# Patient Record
Sex: Male | Born: 1970
Health system: Southern US, Community
[De-identification: ages and names within clinical notes are randomized; demographics above are authoritative.]

## PROBLEM LIST (undated history)

## (undated) DIAGNOSIS — M109 Gout, unspecified: Secondary | ICD-10-CM

## (undated) DIAGNOSIS — K219 Gastro-esophageal reflux disease without esophagitis: Secondary | ICD-10-CM

## (undated) DIAGNOSIS — I319 Disease of pericardium, unspecified: Secondary | ICD-10-CM

## (undated) DIAGNOSIS — R51 Headache: Secondary | ICD-10-CM

## (undated) DIAGNOSIS — R109 Unspecified abdominal pain: Secondary | ICD-10-CM

## (undated) DIAGNOSIS — R0789 Other chest pain: Secondary | ICD-10-CM

## (undated) DIAGNOSIS — R9431 Abnormal electrocardiogram [ECG] [EKG]: Secondary | ICD-10-CM

## (undated) HISTORY — DX: Other chest pain: R07.89

## (undated) HISTORY — DX: Unspecified abdominal pain: R10.9

## (undated) HISTORY — DX: Disease of pericardium, unspecified: I31.9

---

## 2010-03-30 ENCOUNTER — Emergency Department (HOSPITAL_COMMUNITY): Admission: EM | Admit: 2010-03-30 | Discharge: 2010-03-30 | Payer: Self-pay | Admitting: Emergency Medicine

## 2010-04-06 ENCOUNTER — Ambulatory Visit: Payer: Self-pay | Admitting: Diagnostic Radiology

## 2010-04-06 ENCOUNTER — Emergency Department (HOSPITAL_BASED_OUTPATIENT_CLINIC_OR_DEPARTMENT_OTHER): Admission: EM | Admit: 2010-04-06 | Discharge: 2010-04-06 | Payer: Self-pay | Admitting: Emergency Medicine

## 2011-11-25 ENCOUNTER — Emergency Department (INDEPENDENT_AMBULATORY_CARE_PROVIDER_SITE_OTHER): Payer: BC Managed Care – PPO

## 2011-11-25 ENCOUNTER — Emergency Department (HOSPITAL_BASED_OUTPATIENT_CLINIC_OR_DEPARTMENT_OTHER)
Admission: EM | Admit: 2011-11-25 | Discharge: 2011-11-25 | Disposition: A | Discharge status: Home / Self Care | Payer: BC Managed Care – PPO | Attending: Emergency Medicine | Admitting: Emergency Medicine

## 2011-11-25 ENCOUNTER — Encounter (HOSPITAL_BASED_OUTPATIENT_CLINIC_OR_DEPARTMENT_OTHER): Payer: Self-pay | Admitting: *Deleted

## 2011-11-25 DIAGNOSIS — R209 Unspecified disturbances of skin sensation: Secondary | ICD-10-CM | POA: Insufficient documentation

## 2011-11-25 DIAGNOSIS — R55 Syncope and collapse: Secondary | ICD-10-CM | POA: Insufficient documentation

## 2011-11-25 DIAGNOSIS — R42 Dizziness and giddiness: Secondary | ICD-10-CM | POA: Insufficient documentation

## 2011-11-25 DIAGNOSIS — R079 Chest pain, unspecified: Secondary | ICD-10-CM

## 2011-11-25 DIAGNOSIS — M542 Cervicalgia: Secondary | ICD-10-CM | POA: Insufficient documentation

## 2011-11-25 DIAGNOSIS — R51 Headache: Secondary | ICD-10-CM | POA: Insufficient documentation

## 2011-11-25 DIAGNOSIS — R0789 Other chest pain: Secondary | ICD-10-CM | POA: Insufficient documentation

## 2011-11-25 LAB — CBC
Hemoglobin: 12.9 g/dL — ABNORMAL LOW (ref 13.0–17.0)
MCV: 89.4 fL (ref 78.0–100.0)
Platelets: 172 10*3/uL (ref 150–400)
RBC: 4.25 MIL/uL (ref 4.22–5.81)
WBC: 4.5 10*3/uL (ref 4.0–10.5)

## 2011-11-25 LAB — BASIC METABOLIC PANEL
CO2: 29 mEq/L (ref 19–32)
Glucose, Bld: 77 mg/dL (ref 70–99)
Potassium: 3.3 mEq/L — ABNORMAL LOW (ref 3.5–5.1)
Sodium: 140 mEq/L (ref 135–145)

## 2011-11-25 LAB — CARDIAC PANEL(CRET KIN+CKTOT+MB+TROPI)
CK, MB: 3.8 ng/mL (ref 0.3–4.0)
Relative Index: 1.4 (ref 0.0–2.5)
Troponin I: <0.3 ng/mL (ref <0.30)

## 2011-11-25 LAB — D-DIMER, QUANTITATIVE: D-Dimer, Quant: <0.22 ug/mL-FEU (ref 0.00–0.48)

## 2011-11-25 LAB — DIFFERENTIAL
Lymphocytes Relative: 40 % (ref 12–46)
Lymphs Abs: 1.8 10*3/uL (ref 0.7–4.0)
Monocytes Relative: 9 % (ref 3–12)
Neutrophils Relative %: 48 % (ref 43–77)

## 2011-11-25 MED ORDER — IBUPROFEN 800 MG PO TABS
800.0000 mg | ORAL_TABLET | Freq: Once | ORAL | Status: AC
  Administered 2011-11-25: 800 mg via ORAL
  Filled 2011-11-25: qty 1

## 2011-11-25 MED ORDER — HYDROCODONE-ACETAMINOPHEN 5-500 MG PO TABS: 1.0000 | ORAL_TABLET | Freq: Four times a day (QID) | ORAL | Status: AC | PRN

## 2011-11-25 MED ORDER — ASPIRIN 81 MG PO CHEW
324.0000 mg | CHEWABLE_TABLET | Freq: Once | ORAL | Status: AC
  Administered 2011-11-25: 324 mg via ORAL
  Filled 2011-11-25: qty 4

## 2011-11-25 MED ORDER — POTASSIUM CHLORIDE 20 MEQ/15ML (10%) PO LIQD
20.0000 meq | Freq: Once | ORAL | Status: AC
  Administered 2011-11-25: 20 meq via ORAL
  Filled 2011-11-25: qty 15

## 2011-11-25 MED ORDER — HYDROMORPHONE HCL PF 1 MG/ML IJ SOLN
1.0000 mg | Freq: Once | INTRAMUSCULAR | Status: AC
  Administered 2011-11-25: 1 mg via INTRAVENOUS
  Filled 2011-11-25: qty 1

## 2011-11-25 MED ORDER — MORPHINE SULFATE 4 MG/ML IJ SOLN
4.0000 mg | Freq: Once | INTRAMUSCULAR | Status: AC
  Administered 2011-11-25: 4 mg via INTRAVENOUS
  Filled 2011-11-25: qty 1

## 2011-11-25 NOTE — ED Provider Notes (Signed)
Medical screening examination/treatment/procedure(s) were conducted as a shared visit with non-physician practitioner(s) and myself.  I personally evaluated the patient during the encounter  Atypical CP-- Rt sided, reproducible, worse with movment. +Pleuritic. Dimer negative. Pain constant x 8 hours, trop negative. No RF for CAD-- denies smoking, HTN, HLD, DM, fmhx. No RF for VTE. EKG with STE V2/3 slightly in V4 appears to be 2/2 LVH. Pain controlled in ED. Declining second set of CE. Needs PMD f/u. Precautions for return.  Forbes Cellar, MD 11/25/11 [phone]

## 2011-11-25 NOTE — ED Notes (Signed)
Sharp pain in his right chest. Pain comes and goes. It started 2 weeks ago. Pain makes his right arm numb.

## 2011-11-25 NOTE — Discharge Instructions (Signed)
Mr  Chris Fox get a pcp from the list below to follow up with this week.  Your EKG showed cardiac enlargement.  and labs were normal today.  Keep using the ibuprofen every 6 hours with food x 24 hours and the ice.  Use the vicodin as needed for severe pain but do not drive with this medication.  You may go to work tomorrow if you feel better.  Return to the ER for severe pain or SOB. RESOURCE GUIDE  Dental Problems  Patients with Medicaid: [facility] [phone]                                           [phone] [address] [employer] [address] Phone:  [phone]                                                  Phone:  [phone]  If unable to pay or uninsured, contact:  [facility] or [facility]. to become qualified for the adult dental clinic.  Chronic Pain Problems Contact [facility]  [phone] Patients need to be referred by their primary care doctor.  Insufficient Money for Medicine Contact [employer]:  call "211" or [facility] [phone].  No Primary Care Doctor Call [facility]  [phone] Other agencies that provide inexpensive medical care    [facility]  [phone]    [facility]  [phone]    [facility]  [phone]    [facility]  [phone]    [facility]  [phone]    [facility]  [phone]  Psychological Services [facility]  [phone] [facility] Services  [phone] [place] [facility] Mental [facility]   [phone] (emergency services [phone])  Substance Abuse Resources [facility]  [phone] [facility] [phone] [facility] [place] [phone] Floydene Flock [phone] Residential & Outpatient Substance Abuse Program  ([phone]  Abuse/Neglect [facility] Child Abuse Hotline (313)263-0836 [facility] Child Abuse Hotline (709) [phone] (After Hours)  Emergency  Shelter [facility] [phone]  Maternity Homes [facility] at the [name] [facility] [phone] [facility] [phone]  MRSA Hotline #:   310-289-5991    [facility] Resources  [facility]     [employer]                          [facility] Dept. [address] [place]                       [address]      [address]  Patrecia Pace  [facility] Phone:  [phone]                                   Phone:  [phone]                 Phone:  [phone]  [facility] Mental Health Phone:  [phone]  [facility] Child Abuse Hotline ([phone] [phone] (After Hours)

## 2011-11-25 NOTE — ED Provider Notes (Signed)
History     CSN: [id number]  Arrival date & time 11/25/11  1531   First MD Initiated Contact with Patient 11/25/11 1604      No chief complaint on file.   (Consider location/radiation/quality/duration/timing/severity/associated sxs/prior treatment) Patient is a 41 y.o. Fox presenting with chest pain. The history is provided by the patient. No language interpreter was used.  Chest Pain Chest pain occurs constantly. The chest pain is unchanged. The pain is associated with breathing. At its most intense, the pain is at 10/10. The pain is currently at 7/10. The severity of the pain is moderate. The quality of the pain is described as sharp and aching. The pain radiates to the right shoulder and right arm. Chest pain is worsened by certain positions and deep breathing. Primary symptoms include shortness of breath and dizziness. Pertinent negatives for primary symptoms include no fever, no fatigue, no syncope, no cough, no wheezing, no palpitations, no abdominal pain, no nausea and no vomiting.  Dizziness also occurs with diaphoresis. Dizziness does not occur with nausea, vomiting or weakness.   Associated symptoms include diaphoresis, near-syncope and numbness.  Pertinent negatives for associated symptoms include no claudication, no lower extremity edema, no orthopnea, no paroxysmal nocturnal dyspnea and no weakness. He tried aspirin (not sure how much (10am)) for the symptoms. Risk factors include Fox gender and substance abuse.  Pertinent negatives for past medical history include no aneurysm, no anxiety/panic attacks, no aortic aneurysm, no aortic dissection, no CAD and no cancer.  His family medical history is significant for diabetes in family, heart disease in family and hypertension in family.  Pertinent negatives for family medical history include: no early MI in family, no PE in family, no stroke in family, no sudden death in family and no TIA in family.  Procedure history is negative  for cardiac catheterization, echocardiogram, persantine thallium, stress echo, stress thallium and exercise treadmill test.   41 year old Fox coming in with right chest pain that is constant since 9:30 this morning. States that the pain is a sharp pain that is 9/10 at its worst and 5 at 10 otherwise. States that the pain is worse raising the right arm and palpitation to the right chest, and taking a deep breath. Patient is taking 2 aspirin for pain unclear what milligram at work. Also states that the pain radiates to his neck when he raises his right arm.. states he did have some right upper extremity numbness that lasted for seconds. He also thought that he felt dizzy earlier that also lasted for seconds. Does not smoke drink or do drugs. States that 2 weeks ago same pain lasted less than 5 minutes with no shortness of breath.  History reviewed. No pertinent past medical history.  History reviewed. No pertinent past surgical history.  No family history on file.  History  Substance Use Topics  . Smoking status: Never Smoker   . Smokeless tobacco: Not on file  . Alcohol Use: No      Review of Systems  Constitutional: Positive for diaphoresis. Negative for fever, chills and fatigue.  HENT: Positive for neck pain. Negative for ear pain, sore throat, trouble swallowing and neck stiffness.   Respiratory: Positive for shortness of breath. Negative for cough and wheezing.   Cardiovascular: Positive for chest pain and near-syncope. Negative for palpitations, orthopnea, claudication and syncope.  Gastrointestinal: Negative for nausea, vomiting and abdominal pain.  Genitourinary: Negative.   Musculoskeletal: Negative for back pain.  Skin: Negative.   Neurological: Positive for  dizziness, light-headedness, numbness and headaches. Negative for syncope, speech difficulty and weakness.  Psychiatric/Behavioral: Negative.   All other systems reviewed and are negative.    Allergies  Review of  patient's allergies indicates no known allergies.  Home Medications   Current Outpatient Rx  Name Route Sig Dispense Refill  . IBUPROFEN 200 MG PO TABS Oral Take 200 mg by mouth every 6 (six) hours as needed. Patient used this medication for his tooth pain.      BP 131/73  Pulse 61  Temp(Src) 99.8 F (37.7 C) (Oral)  Resp 20  SpO2 99%  Physical Exam  Nursing note and vitals reviewed. Constitutional: He is oriented to person, place, and time. He appears well-developed and well-nourished. No distress.  HENT:  Head: Normocephalic.  Eyes: Conjunctivae and EOM are normal. Pupils are equal, round, and reactive to light.  Neck: Normal range of motion. Neck supple.  Cardiovascular: Normal rate, regular rhythm, normal heart sounds and intact distal pulses.  Exam reveals no gallop and no friction rub.   No murmur heard.      Reproducible pain to R chest Worse with raising the RUE and leaning forward.    Pulmonary/Chest: Effort normal and breath sounds normal. No respiratory distress. He has no wheezes.  Abdominal: Soft. Bowel sounds are normal.  Musculoskeletal: Normal range of motion. He exhibits tenderness. He exhibits no edema.       R chest  Neurological: He is alert and oriented to person, place, and time. No cranial nerve deficit.  Skin: Skin is warm and dry.  Psychiatric: He has a normal mood and affect.    ED Course  Procedures (including critical care time)   Labs Reviewed  CBC  DIFFERENTIAL  BASIC METABOLIC PANEL  CARDIAC PANEL(CRET KIN+CKTOT+MB+TROPI)  D-DIMER, QUANTITATIVE   Dg Chest Port 1 View  11/25/2011  *RADIOLOGY REPORT*  Clinical Data: Right-sided chest pain and arm numbness.  PORTABLE CHEST - 1 VIEW  Comparison: No priors.  Findings: Lung volumes are normal.  No consolidative airspace disease.  No pleural effusions.  No pneumothorax.  No pulmonary nodule or mass noted.  Pulmonary vasculature and the cardiomediastinal silhouette are within normal limits.   IMPRESSION: 1. No radiographic evidence of acute cardiopulmonary disease.  Original Report Authenticated By: Florencia Reasons, M.D.     No diagnosis found.    MDM  C/o R chest pain x 8 hours.  Worse with palpatation and leaning forward.  -trop.  EKG show cardiomegaly.  Better after morphine for pain 5/10  D-dimer negative.  CK elevated,  CKMB normal.  Will repeat pain meds: dilaudid 1mg  IV, and ibuprofen 800mg  and ice.    Labs Reviewed  CBC - Abnormal; Notable for the following:    Hemoglobin 12.9 (*)    HCT 38.0 (*)    RDW 11.1 (*)    All other components within normal limits  BASIC METABOLIC PANEL - Abnormal; Notable for the following:    Potassium 3.3 (*)    All other components within normal limits  CARDIAC PANEL(CRET KIN+CKTOT+MB+TROPI) - Abnormal; Notable for the following:    Total CK 277 (*)    All other components within normal limits  DIFFERENTIAL  D-DIMER, QUANTITATIVE    Date: 11/25/2011  Rate: 58  Rhythm: normal sinus rhythm  QRS Axis: normal  Intervals: normal  ST/T Wave abnormalities: normal  Conduction Disutrbances:none  Narrative Interpretation: L atrial enlargement  Old EKG Reviewed: none available  1800  Report given to Dr. Hyman Hopes.  Will recheck pain level then probably dc home.           Remi Haggard, NP 11/25/11 1805

## 2011-12-10 ENCOUNTER — Encounter (HOSPITAL_COMMUNITY): Payer: Self-pay | Admitting: Emergency Medicine

## 2011-12-10 ENCOUNTER — Encounter (HOSPITAL_COMMUNITY)
Admission: EM | Disposition: A | Discharge status: Home / Self Care | Payer: Self-pay | Source: Ambulatory Visit | Attending: Emergency Medicine

## 2011-12-10 ENCOUNTER — Encounter (HOSPITAL_COMMUNITY): Payer: Self-pay | Admitting: Cardiology

## 2011-12-10 ENCOUNTER — Emergency Department (HOSPITAL_COMMUNITY)
Admission: EM | Admit: 2011-12-10 | Discharge: 2011-12-10 | Disposition: A | Discharge status: Nursing Home (Includes ICF) | Payer: BC Managed Care – PPO | Source: Home / Self Care | Attending: Emergency Medicine | Admitting: Emergency Medicine

## 2011-12-10 ENCOUNTER — Emergency Department (HOSPITAL_COMMUNITY): Payer: BC Managed Care – PPO

## 2011-12-10 ENCOUNTER — Ambulatory Visit (HOSPITAL_COMMUNITY)
Admission: EM | Admit: 2011-12-10 | Discharge: 2011-12-11 | Disposition: A | Discharge status: Home / Self Care | Payer: BC Managed Care – PPO | Source: Ambulatory Visit | Attending: Cardiology | Admitting: Cardiology

## 2011-12-10 ENCOUNTER — Inpatient Hospital Stay (HOSPITAL_COMMUNITY): Payer: BC Managed Care – PPO

## 2011-12-10 DIAGNOSIS — I219 Acute myocardial infarction, unspecified: Secondary | ICD-10-CM

## 2011-12-10 DIAGNOSIS — R079 Chest pain, unspecified: Secondary | ICD-10-CM

## 2011-12-10 DIAGNOSIS — R071 Chest pain on breathing: Secondary | ICD-10-CM | POA: Insufficient documentation

## 2011-12-10 DIAGNOSIS — R072 Precordial pain: Secondary | ICD-10-CM

## 2011-12-10 DIAGNOSIS — R001 Bradycardia, unspecified: Secondary | ICD-10-CM | POA: Diagnosis present

## 2011-12-10 HISTORY — DX: Gastro-esophageal reflux disease without esophagitis: K21.9

## 2011-12-10 HISTORY — DX: Headache: R51

## 2011-12-10 HISTORY — PX: LEFT HEART CATHETERIZATION WITH CORONARY ANGIOGRAM: SHX5451

## 2011-12-10 HISTORY — DX: Abnormal electrocardiogram (ECG) (EKG): R94.31

## 2011-12-10 HISTORY — PX: CARDIAC CATHETERIZATION: SHX172

## 2011-12-10 LAB — CARDIAC PANEL(CRET KIN+CKTOT+MB+TROPI)
CK, MB: 4 ng/mL (ref 0.3–4.0)
Relative Index: 1.2 (ref 0.0–2.5)
Relative Index: 1.1 (ref 0.0–2.5)
Total CK: 409 U/L — ABNORMAL HIGH (ref 7–232)
Total CK: 359 U/L — ABNORMAL HIGH (ref 7–232)
Troponin I: <0.3 ng/mL (ref <0.30)

## 2011-12-10 LAB — BASIC METABOLIC PANEL
CO2: 24 mEq/L (ref 19–32)
Calcium: 9.2 mg/dL (ref 8.4–10.5)
Chloride: 105 mEq/L (ref 96–112)
GFR calc Af Amer: >90 mL/min (ref >90)
Sodium: 139 mEq/L (ref 135–145)

## 2011-12-10 LAB — HEPATIC FUNCTION PANEL
ALT: 11 U/L (ref 0–53)
AST: 22 U/L (ref 0–37)
Albumin: 3.7 g/dL (ref 3.5–5.2)
Alkaline Phosphatase: 80 U/L (ref 39–117)
Total Bilirubin: 0.4 mg/dL (ref 0.3–1.2)
Total Protein: 6.6 g/dL (ref 6.0–8.3)

## 2011-12-10 LAB — DIFFERENTIAL
Basophils Absolute: 0 10*3/uL (ref 0.0–0.1)
Basophils Relative: 0 % (ref 0–1)
Lymphocytes Relative: 52 % — ABNORMAL HIGH (ref 12–46)
Neutro Abs: 2 10*3/uL (ref 1.7–7.7)

## 2011-12-10 LAB — CBC
HCT: 35.3 % — ABNORMAL LOW (ref 39.0–52.0)
Hemoglobin: 11.8 g/dL — ABNORMAL LOW (ref 13.0–17.0)
MCH: 30 pg (ref 26.0–34.0)
MCHC: 33.4 g/dL (ref 30.0–36.0)
MCHC: 33.4 g/dL (ref 30.0–36.0)
MCV: 89.8 fL (ref 78.0–100.0)
Platelets: 182 10*3/uL (ref 150–400)
RBC: 3.93 MIL/uL — ABNORMAL LOW (ref 4.22–5.81)
RDW: 11.6 % (ref 11.5–15.5)
WBC: 5.3 10*3/uL (ref 4.0–10.5)

## 2011-12-10 LAB — HEMOGLOBIN A1C
Hgb A1c MFr Bld: 5.5 % (ref <5.7)
Mean Plasma Glucose: 111 mg/dL (ref <117)

## 2011-12-10 LAB — POCT I-STAT TROPONIN I: Troponin i, poc: 0 ng/mL (ref 0.00–0.08)

## 2011-12-10 LAB — LIPID PANEL
HDL: 35 mg/dL — ABNORMAL LOW (ref >39)
LDL Cholesterol: 95 mg/dL (ref 0–99)

## 2011-12-10 LAB — APTT: aPTT: 33 seconds (ref 24–37)

## 2011-12-10 SURGERY — LEFT HEART CATHETERIZATION WITH CORONARY ANGIOGRAM
Anesthesia: LOCAL

## 2011-12-10 MED ORDER — FENTANYL CITRATE 0.05 MG/ML IJ SOLN
INTRAMUSCULAR | Status: AC
  Filled 2011-12-10: qty 2

## 2011-12-10 MED ORDER — SODIUM CHLORIDE 0.9 % IV SOLN: INTRAVENOUS | Status: AC

## 2011-12-10 MED ORDER — ONDANSETRON HCL 4 MG PO TABS: 4.0000 mg | ORAL_TABLET | Freq: Four times a day (QID) | ORAL | Status: DC | PRN

## 2011-12-10 MED ORDER — SODIUM CHLORIDE 0.9 % IJ SOLN
3.0000 mL | Freq: Two times a day (BID) | INTRAMUSCULAR | Status: DC
  Administered 2011-12-10 – 2011-12-11 (×2): 3 mL via INTRAVENOUS

## 2011-12-10 MED ORDER — NITROGLYCERIN 0.2 MG/ML ON CALL CATH LAB
INTRAVENOUS | Status: AC
  Filled 2011-12-10: qty 1

## 2011-12-10 MED ORDER — ALUM & MAG HYDROXIDE-SIMETH 200-200-20 MG/5ML PO SUSP: 30.0000 mL | Freq: Four times a day (QID) | ORAL | Status: DC | PRN

## 2011-12-10 MED ORDER — ONDANSETRON HCL 4 MG/2ML IJ SOLN
4.0000 mg | Freq: Once | INTRAMUSCULAR | Status: AC
  Administered 2011-12-10: 12:00:00 via INTRAVENOUS

## 2011-12-10 MED ORDER — HEPARIN (PORCINE) IN NACL 2-0.9 UNIT/ML-% IJ SOLN
INTRAMUSCULAR | Status: AC
  Filled 2011-12-10: qty 2000

## 2011-12-10 MED ORDER — ACETAMINOPHEN 325 MG PO TABS
650.0000 mg | ORAL_TABLET | Freq: Four times a day (QID) | ORAL | Status: DC | PRN
  Administered 2011-12-10: 650 mg via ORAL
  Filled 2011-12-10: qty 2

## 2011-12-10 MED ORDER — ACETAMINOPHEN 325 MG PO TABS: 650.0000 mg | ORAL_TABLET | ORAL | Status: DC | PRN

## 2011-12-10 MED ORDER — ACETAMINOPHEN 650 MG RE SUPP: 650.0000 mg | Freq: Four times a day (QID) | RECTAL | Status: DC | PRN

## 2011-12-10 MED ORDER — ASPIRIN 81 MG PO CHEW
CHEWABLE_TABLET | ORAL | Status: AC
  Administered 2011-12-10: 324 mg via ORAL
  Filled 2011-12-10: qty 4

## 2011-12-10 MED ORDER — ASPIRIN EC 81 MG PO TBEC
81.0000 mg | DELAYED_RELEASE_TABLET | Freq: Every day | ORAL | Status: DC
  Administered 2011-12-11: 81 mg via ORAL
  Filled 2011-12-10: qty 1

## 2011-12-10 MED ORDER — HEPARIN SODIUM (PORCINE) 5000 UNIT/ML IJ SOLN
5000.0000 [IU] | Freq: Three times a day (TID) | INTRAMUSCULAR | Status: DC
  Administered 2011-12-10 – 2011-12-11 (×2): 5000 [IU] via SUBCUTANEOUS
  Filled 2011-12-10 (×5): qty 1

## 2011-12-10 MED ORDER — MORPHINE SULFATE 4 MG/ML IJ SOLN
INTRAMUSCULAR | Status: AC
  Administered 2011-12-10: 4 mg via INTRAVENOUS
  Filled 2011-12-10: qty 1

## 2011-12-10 MED ORDER — PANTOPRAZOLE SODIUM 40 MG PO TBEC
40.0000 mg | DELAYED_RELEASE_TABLET | Freq: Every day | ORAL | Status: DC
  Administered 2011-12-10 – 2011-12-11 (×2): 40 mg via ORAL
  Filled 2011-12-10 (×2): qty 1

## 2011-12-10 MED ORDER — LIDOCAINE HCL (PF) 1 % IJ SOLN
INTRAMUSCULAR | Status: AC
  Filled 2011-12-10: qty 30

## 2011-12-10 MED ORDER — SODIUM CHLORIDE 0.9 % IV SOLN
Freq: Once | INTRAVENOUS | Status: AC
  Administered 2011-12-10: 20 mL/h via INTRAVENOUS

## 2011-12-10 MED ORDER — ONDANSETRON HCL 4 MG/2ML IJ SOLN
INTRAMUSCULAR | Status: AC
  Filled 2011-12-10: qty 2

## 2011-12-10 MED ORDER — HEPARIN SODIUM (PORCINE) 5000 UNIT/ML IJ SOLN
INTRAMUSCULAR | Status: AC
  Filled 2011-12-10: qty 1

## 2011-12-10 MED ORDER — IBUPROFEN 600 MG PO TABS
600.0000 mg | ORAL_TABLET | Freq: Three times a day (TID) | ORAL | Status: DC
Start: 2011-12-10 — End: 2011-12-11
  Administered 2011-12-10 – 2011-12-11 (×3): 600 mg via ORAL
  Filled 2011-12-10 (×7): qty 1

## 2011-12-10 MED ORDER — MIDAZOLAM HCL 2 MG/2ML IJ SOLN
INTRAMUSCULAR | Status: AC
  Filled 2011-12-10: qty 2

## 2011-12-10 MED ORDER — ONDANSETRON HCL 4 MG/2ML IJ SOLN: 4.0000 mg | Freq: Four times a day (QID) | INTRAMUSCULAR | Status: DC | PRN

## 2011-12-10 MED ORDER — MORPHINE SULFATE 4 MG/ML IJ SOLN
4.0000 mg | Freq: Once | INTRAMUSCULAR | Status: AC
  Administered 2011-12-10: 4 mg via INTRAVENOUS

## 2011-12-10 MED ORDER — SODIUM CHLORIDE 0.9 % IV SOLN
INTRAVENOUS | Status: AC
  Administered 2011-12-10: 15:00:00 via INTRAVENOUS

## 2011-12-10 MED ORDER — ENOXAPARIN SODIUM 40 MG/0.4ML ~~LOC~~ SOLN: 40.0000 mg | SUBCUTANEOUS | Status: DC

## 2011-12-10 MED ORDER — ASPIRIN 81 MG PO CHEW
324.0000 mg | CHEWABLE_TABLET | Freq: Once | ORAL | Status: AC
  Administered 2011-12-10: 324 mg via ORAL

## 2011-12-10 MED ORDER — BIVALIRUDIN 250 MG IV SOLR
INTRAVENOUS | Status: AC
  Filled 2011-12-10: qty 250

## 2011-12-10 NOTE — Discharge Instructions (Signed)
Chest Pain (Nonspecific) It is often hard to give a specific diagnosis for the cause of chest pain. There is always a chance that your pain could be related to something serious, such as a heart attack or a blood clot in the lungs. You need to follow up with your caregiver for further evaluation. CAUSES   Heartburn.   Pneumonia or bronchitis.   Anxiety or stress.   Inflammation around your heart (pericarditis) or lung (pleuritis or pleurisy).   A blood clot in the lung.   A collapsed lung (pneumothorax). It can develop suddenly on its own (spontaneous pneumothorax) or from injury (trauma) to the chest.   Shingles infection (herpes zoster virus).  The chest wall is composed of bones, muscles, and cartilage. Any of these can be the source of the pain.  The bones can be bruised by injury.   The muscles or cartilage can be strained by coughing or overwork.   The cartilage can be affected by inflammation and become sore (costochondritis).  DIAGNOSIS  Lab tests or other studies, such as X-rays, electrocardiography, stress testing, or cardiac imaging, may be needed to find the cause of your pain.  TREATMENT   Treatment depends on what may be causing your chest pain. Treatment may include:   Acid blockers for heartburn.   Anti-inflammatory medicine.   Pain medicine for inflammatory conditions.   Antibiotics if an infection is present.   You may be advised to change lifestyle habits. This includes stopping smoking and avoiding alcohol, caffeine, and chocolate.   You may be advised to keep your head raised (elevated) when sleeping. This reduces the chance of acid going backward from your stomach into your esophagus.   Most of the time, nonspecific chest pain will improve within 2 to 3 days with rest and mild pain medicine.  HOME CARE INSTRUCTIONS   If antibiotics were prescribed, take your antibiotics as directed. Finish them even if you start to feel better.   For the next few  days, avoid physical activities that bring on chest pain. Continue physical activities as directed.   Do not smoke.   Avoid drinking alcohol.   Only take over-the-counter or prescription medicine for pain, discomfort, or fever as directed by your caregiver.   Follow your caregiver's suggestions for further testing if your chest pain does not go away.   Keep any follow-up appointments you made. If you do not go to an appointment, you could develop lasting (chronic) problems with pain. If there is any problem keeping an appointment, you must call to reschedule.  SEEK MEDICAL CARE IF:   You think you are having problems from the medicine you are taking. Read your medicine instructions carefully.   Your chest pain does not go away, even after treatment.   You develop a rash with blisters on your chest.  SEEK IMMEDIATE MEDICAL CARE IF:   You have increased chest pain or pain that spreads to your arm, neck, jaw, back, or abdomen.   You develop shortness of breath, an increasing cough, or you are coughing up blood.   You have severe back or abdominal pain, feel nauseous, or vomit.   You develop severe weakness, fainting, or chills.   You have a fever.  THIS IS AN EMERGENCY. Do not wait to see if the pain will go away. Get medical help at once. Call your local emergency services (911 in [place] Do not drive yourself to the hospital. MAKE SURE YOU:   Understand these instructions.     Will watch your condition.   Will get help right away if you are not doing well or get worse.  Document Released: 04/24/2005 Document Revised: 07/04/2011 Document Reviewed: 02/18/2008 Exit[employer] Patient Information 2012 [employer]. 

## 2011-12-10 NOTE — ED Notes (Signed)
MD in room at this time. Pt states he has been having SOB and chest pain since 11/25/11. States he wanted to be seen today due to increase in the pain, stating it is in center of chest and feels like pressure. Dr.Cook, EDP, activating CODE STEMI.

## 2011-12-10 NOTE — H&P (Signed)
Chris Fox Internal Medicine Resident Note  Patient ID: Chris Fox MRN: [id number] DOB/AGE: [birth year] 41 y.o. Admit date: 12/10/2011  Primary Care Physician:No primary provider on file. Primary Cardiologist:  None  Principal Problem:  *Chest pain  HPI: Mr. Chris Fox is a 41 year old man with no previous PMH who presented to the [facility] urgent care this morning after the onset of retrosternal chest pain that started this morning around 7 am.  He states that the pain is a dull, ache that made him feel "like he was going to pass out."  He denies any diaphoresis, nausea, or vomiting.  The pain was an 8/10 when he initially presented but had dropped to a 4/10 when he arrived in the cath lab after getting ASA and morphine in the ED.    History reviewed. No pertinent past medical history.  History reviewed. No pertinent past surgical history.  Family History  Problem Relation Age of Onset  . Diabetes Maternal Grandmother     History   Social History  . Marital Status: Single    Spouse Name: Chris Fox    Number of Children: Chris Fox  . Years of Education: Chris Fox   Occupational History  . Not on file.   Social History Main Topics  . Smoking status: Never Smoker   . Smokeless tobacco: Not on file  . Alcohol Use: No  . Drug Use: No  . Sexually Active: No   Other Topics Concern  . Not on file   Social History Narrative  . No narrative on file    Current Facility-Administered Medications  Medication Dose Route Frequency Provider Last Rate Last Dose  . aspirin chewable tablet 324 mg  324 mg Oral Once Donnetta Hutching, MD   324 mg at 12/10/11 1221  . bivalirudin (ANGIOMAX) 250 MG injection           . fentaNYL (SUBLIMAZE) 0.05 MG/ML injection           . heparin 2-0.9 UNIT/ML-% infusion           . lidocaine (XYLOCAINE) 1 % injection           . midazolam (VERSED) 2 MG/2ML injection           . morphine 4 MG/ML injection 4 mg  4 mg Intravenous Once Donnetta Hutching, MD   4 mg at 12/10/11 1225  .  nitroGLYCERIN (NTG ON-CALL) 0.2 mg/mL injection           . ondansetron (ZOFRAN) injection 4 mg  4 mg Intravenous Once Donnetta Hutching, MD      . DISCONTD: heparin 5000 UNIT/ML injection            Facility-Administered Medications Ordered in Other Encounters  Medication Dose Route Frequency Provider Last Rate Last Dose  . 0.9 %  sodium chloride infusion   Intravenous Once Chris Molly, MD 20 mL/hr at 12/10/11 1129 20 mL/hr at 12/10/11 1129   ROS: Bold if positive:  General:  fevers/chills/night sweats Eyes:  blurry vision, diplopia,  amaurosis ENT:  sore throat , hearing loss Resp: cough, wheezing,  hemoptysis CV: edema , palpitations GI:  abdominal pain, nausea, vomiting, diarrhea,  constipation GU: dysuria, frequency,  hematuria Skin:  rash Neuro: headache, numbness, tingling,  weakness of extremities Musculoskeletal: joint pain or swelling Heme:  bleeding, DVT, or easy bruising Endo:  polydipsia , polyuria  Physical Exam: There were no vitals taken for this visit.  Current Weight  12/10/11 180 lb (81.647 kg)  General: Vital signs reviewed and noted. Well-developed, well-nourished, anxious appearing man who is alert, appropriate and cooperative throughout examination.  HEENT: normal Neck: JVP normal. Carotid upstrokes normal without bruits. No thyromegaly. Lungs: Normal respiratory effort. Clear to auscultation BL without crackles or wheezes. CV: RRR without murmur or gallop Abd: soft, NT, +BS, no bruit, no hepatosplenomegaly Back: no CVA tenderness Ext: no clubbing, cyanosis, edema         Femoral pulses 2+equal without bruits        DP/PT pulses intact and equal Skin: warm and dry without rash Neuro: CNII-XII intact             Strength intact = bilaterally   Radiology: Dg Chest Port 1 View  11/25/2011  *RADIOLOGY REPORT*  Clinical Data: Right-sided chest pain and arm numbness.  PORTABLE CHEST - 1 VIEW  Comparison: No priors.  Findings: Lung volumes are normal.  No  consolidative airspace disease.  No pleural effusions.  No pneumothorax.  No pulmonary nodule or mass noted.  Pulmonary vasculature and the cardiomediastinal silhouette are within normal limits.  IMPRESSION: 1. No radiographic evidence of acute cardiopulmonary disease.  Original Report Authenticated By: Chris Fox, M.D.   EKG: ST segment elevation in V2, V3, V4.  ASSESSMENT AND PLAN:  Pt is a 41 y.o. yo male with no significant PMHx who was evaluated on 12/10/2011 for chest pain.  1.  Chest pain:   With ST elevation in the anterior leads was called as a Code STEMI and rushed to the cath lab.  He has no real risk factors other then his grandmother with diabetes.    Patient history and plan of care reviewed with attending, Dr. Shirlee Latch  Signed: PRIBULA,CHRISTOPHER 12/10/2011, 12:56 PM  Patient seen with resident, agree with history/physical/plan.  See cath report: normal EF and no angiographic CAD.  Pain is pleuritic.  ST elevation on ECG may be early repolarization versus pericarditis pattern.   - CXR. - Echo to assess for pericardial effusion.  - Await CBC, BMET, cardiac enzymes.  - Treat with Ibuprofen and PPI (? Acute pericarditis).  - Keep in hospital overnight, discharge in am if stable.   Chris Fox 12/10/2011 1:13 PM

## 2011-12-10 NOTE — CV Procedure (Signed)
   Cardiac Catheterization Procedure Note  Name: Chris Fox MRN: [id number] DOB: [birth year]  Procedure: Left Heart Cath, Selective Coronary Angiography, LV angiography  Indication: Chest pain, ECG with anteroseptal ST elevation   Procedural details: The right groin was prepped, draped, and anesthetized with 1% lidocaine. Using modified Seldinger technique, a 6 French sheath was introduced into the right femoral artery. Standard Judkins catheters were used for coronary angiography and left ventriculography. Catheter exchanges were performed over a guidewire. There were no immediate procedural complications. The patient was transferred to the post catheterization recovery area for further monitoring.  Procedural Findings: Hemodynamics:  AO 112/70 LV 112/16   Coronary angiography: Coronary dominance: right  Left mainstem: Short, no angiographic coronary disease.   Left anterior descending (LAD): No angiographic coronary disease.   Left circumflex (LCx): No angiographic coronary disease.   Right coronary artery (RCA): Relatively small vessel, no angiographic coronary disease.   Left ventriculography: Left ventricular systolic function is normal, LVEF is estimated at 55%, there is no significant mitral regurgitation   Final Conclusions:  Normal coronaries.  Normal LV systolic function.  Possible early repolarization versus pericarditis pattern on ECG (chest pain is pleuritic).  Will plan to treat with Ibuprofen and PPI for now.  Awaiting labs including BMET, CBC, cardiac enzymes.   Marca Ancona 12/10/2011, 1:08 PM

## 2011-12-10 NOTE — Progress Notes (Signed)
*  PRELIMINARY RESULTS* Echocardiogram 2D Echocardiogram has been performed.  Chris Fox [facility] 12/10/2011, 4:27 PM

## 2011-12-10 NOTE — ED Provider Notes (Addendum)
History     CSN: [id number]  Arrival date & time 12/10/11  1103   First MD Initiated Contact with Patient 12/10/11 1121      Chief Complaint  Patient presents with  . Chest Pain    (Consider location/radiation/quality/duration/timing/severity/associated sxs/prior treatment) HPI Comments: Patient presents urgent care this morning complaining of chest pain, woke up with severe pain on his upper chest (points to retrosternal region) since yesterday's been feeling dizzy and almost like he is going to "PASS OUT", the pain radiates to both sides of his neck and also towards his shoulders and in between his shoulder blades. Positioning and movement makes the pain worse and he's been feeling nauseated and dizzy. Patient denies any shortness of breath, cough recent congestion. Patient also denies any recent exerted physical activities or recent falls or injuries.   During interview patient seemed a little disorganized and not answering to questions in a straightforward fashion. "I feel I'm about to pass out",   Patient is a 41 y.o. male presenting with chest pain. The history is provided by the patient.  Chest Pain The chest pain began 3 - 5 hours ago. Chest pain occurs constantly. The chest pain is unchanged. The pain is associated with lifting. At its most intense, the pain is at 8/10. The pain is currently at 8/10. The severity of the pain is moderate. The quality of the pain is described as aching, dull and heavy. Chest pain is worsened by certain positions. Primary symptoms include nausea and dizziness. Pertinent negatives for primary symptoms include no fever, no fatigue, no shortness of breath, no palpitations and no vomiting.  Dizziness also occurs with nausea. Dizziness does not occur with vomiting, weakness or diaphoresis.   Associated symptoms include near-syncope.  Pertinent negatives for associated symptoms include no diaphoresis, no lower extremity edema, no numbness, no orthopnea, no  paroxysmal nocturnal dyspnea and no weakness. He tried nothing for the symptoms.     History reviewed. No pertinent past medical history.  History reviewed. No pertinent past surgical history.  No family history on file.  History  Substance Use Topics  . Smoking status: Never Smoker   . Smokeless tobacco: Not on file  . Alcohol Use: No      Review of Systems  Constitutional: Negative for fever, diaphoresis and fatigue.  Respiratory: Negative for shortness of breath.   Cardiovascular: Positive for chest pain and near-syncope. Negative for palpitations and orthopnea.  Gastrointestinal: Positive for nausea. Negative for vomiting.  Neurological: Positive for dizziness. Negative for weakness and numbness.    Allergies  Review of patient's allergies indicates no known allergies.  Home Medications   Current Outpatient Rx  Name Route Sig Dispense Refill  . IBUPROFEN 200 MG PO TABS Oral Take 200 mg by mouth every 6 (six) hours as needed. Patient used this medication for his tooth pain.      BP 148/98  Pulse 59  Temp(Src) 98.3 F (36.8 C) (Oral)  Resp 22  Wt 180 lb (81.647 kg)  SpO2 100%  Physical Exam  ED Course  Procedures (including critical care time)  Labs Reviewed - No data to display No results found.   1. Chest pain at rest       MDM  Patient presents again with recurrent retrosternal pattern of pain with no coexistent respiratory symptoms. This episode started her on 7 AM. Patient's EKG with normal sinus rhythm. No ST elevations (v3 Isolated) or depressions or observable ischemic changes. Patient is somewhat anxious. Patient  had some preliminary work on his last visit in April to the emergency department with (one set of cardiac enzymes and normal chest x-ray). Given absence of any alternative explanations for his recurrent chest pains we will transfer to the emergency department to be evaluated for atypical chest pain.        Jimmie Molly,  MD 12/10/11 1216  Jimmie Molly, MD 12/10/11 1228

## 2011-12-10 NOTE — ED Notes (Signed)
Pt reports chest pain woke him up at 7am today. Unable to lay down due to discomfort. Pt states he felt faint at work yesterday and like he was moving slow compared to his usual. Co-workers also noted pt to be slow yesterday and not his self. Pt finished his shift yesterday. Pt. Has not taken any  OTC meds for this discomfort today. Pt reports left shoulder to feel numb that also started this am. Pt was seen approx 2 weeks ago for the same type of discomfort. He states he just knows something is going on. EKG completed upon arrival to urgent care. Dr Ladon Applebaum aware and in to see pt. IV established and Dr Ladon Applebaum advised pt of transfer to [facility] ED. Pt verbalizes understanding of need for transfer.

## 2011-12-10 NOTE — ED Notes (Signed)
[facility] notified for transport 

## 2011-12-10 NOTE — ED Provider Notes (Signed)
History     CSN: [id number]  Arrival date & time 12/10/11  1211   First MD Initiated Contact with Patient 12/10/11 1224      Chief Complaint  Patient presents with  . Chest Pain    (Consider location/radiation/quality/duration/timing/severity/associated sxs/prior treatment) HPI.... level V caveat for urgent need for intervention.  Pressure like sensation anterior chest since 0700 today.  Also complains of faintness and dyspnea. Initially evaluated at urgent care Center and sent to the emergency department. No obvious cardiac risk factors. No smoking, no diabetes, no hypertension, no family history, no hypercholesterolemia.  Initial EKG shows ST elevation in V2, V3, V4.  History reviewed. No pertinent past medical history.  History reviewed. No pertinent past surgical history.  History reviewed. No pertinent family history.  History  Substance Use Topics  . Smoking status: Never Smoker   . Smokeless tobacco: Not on file  . Alcohol Use: No      Review of Systems  Unable to perform ROS: Other    Allergies  Review of patient's allergies indicates no known allergies.  Home Medications   Current Outpatient Rx  Name Route Sig Dispense Refill  . IBUPROFEN 200 MG PO TABS Oral Take 200 mg by mouth every 6 (six) hours as needed. Patient used this medication for his tooth pain.      There were no vitals taken for this visit.  Physical Exam  Nursing note and vitals reviewed. Constitutional: He is oriented to person, place, and time. He appears well-developed and well-nourished.  HENT:  Head: Normocephalic and atraumatic.  Eyes: Conjunctivae and EOM are normal. Pupils are equal, round, and reactive to light.  Neck: Normal range of motion. Neck supple.  Cardiovascular: Normal rate and regular rhythm.   Pulmonary/Chest: Effort normal and breath sounds normal.  Abdominal: Soft. Bowel sounds are normal.  Musculoskeletal: Normal range of motion.  Neurological: He is alert and  oriented to person, place, and time.  Skin: Skin is warm and dry.  Psychiatric: He has a normal mood and affect.    ED Course  Procedures (including critical care time)  Labs Reviewed - No data to display No results found.   No diagnosis found.  Date: 12/10/2011  Rate: 57  Rhythm: normal sinus rhythm  QRS Axis: normal  Intervals: normal  ST/T Wave abnormalities: ST elevations anteriorly  Conduction Disutrbances:none  Narrative Interpretation:   Old EKG Reviewed: changes noted CRITICAL CARE Performed by: Donnetta Hutching  ?  Total critical care time: 30  Critical care time was exclusive of separately billable procedures and treating other patients.  Critical care was necessary to treat or prevent imminent or life-threatening deterioration.  Critical care was time spent personally by me on the following activities: development of treatment plan with patient and/or surrogate as well as nursing, discussions with consultants, evaluation of patient's response to treatment, examination of patient, obtaining history from patient or surrogate, ordering and performing treatments and interventions, ordering and review of laboratory studies, ordering and review of radiographic studies, pulse oximetry and re-evaluation of patient's condition.  MDM  Section EKG reveals worsening ST elevation  In the V2, V3, V4 leads.  Code STEMI called.  IV morphine, IV Zofran, by mouth aspirin given.  Patient transferred to the Cath Lab at approximately 1225.        Donnetta Hutching, MD 12/10/11 1236

## 2011-12-10 NOTE — ED Notes (Signed)
Per [facility]: pt c/o midsternal CP starting this am; pt denies SOB but c/o nasuea; IV L FA 18g

## 2011-12-10 NOTE — ED Notes (Signed)
Normal Saline infused prior to transfer to [facility]

## 2011-12-11 ENCOUNTER — Encounter (HOSPITAL_COMMUNITY): Payer: Self-pay | Admitting: Cardiology

## 2011-12-11 DIAGNOSIS — I219 Acute myocardial infarction, unspecified: Secondary | ICD-10-CM

## 2011-12-11 LAB — BASIC METABOLIC PANEL
BUN: 12 mg/dL (ref 6–23)
CO2: 26 mEq/L (ref 19–32)
Chloride: 107 mEq/L (ref 96–112)
GFR calc Af Amer: >90 mL/min (ref >90)
Potassium: 3.6 mEq/L (ref 3.5–5.1)

## 2011-12-11 LAB — CARDIAC PANEL(CRET KIN+CKTOT+MB+TROPI): Relative Index: 1.2 (ref 0.0–2.5)

## 2011-12-11 MED ORDER — IBUPROFEN 600 MG PO TABS: 600.0000 mg | ORAL_TABLET | Freq: Three times a day (TID) | ORAL | Status: AC

## 2011-12-11 MED ORDER — PANTOPRAZOLE SODIUM 40 MG PO TBEC: 40.0000 mg | DELAYED_RELEASE_TABLET | Freq: Every day | ORAL | Status: DC

## 2011-12-11 NOTE — Progress Notes (Signed)
UR Completed.  Halen Antenucci Jane [phone] 12/11/2011  

## 2011-12-11 NOTE — Progress Notes (Signed)
    I have seen and examined the patient. I agree with the above note with the addition of : no more chest pain. Cardiac cath showed no significant  CAD. Echo was unremarkable. Right groin is intact without hematoma. Can discharge home today. Should follow up with PA in 2 weeks and PCP.   Lorine Bears MD, [facility] 12/11/2011 10:41 AM

## 2011-12-11 NOTE — Progress Notes (Signed)
Cardiology Progress Note Patient Name: Chris Fox Date of Encounter: 12/11/2011, 7:51 AM     Subjective  One episode of chest pain while lying in bed last night, relieved with tylenol. Currently chest pain free without complaints. Ready to go home.   Objective   Telemetry: Sinus brady 40-60s  Medications: . aspirin  324 mg Oral Once  . aspirin EC  81 mg Oral Daily  . fentaNYL      . heparin      . heparin  5,000 Units Subcutaneous Q8H  . ibuprofen  600 mg Oral Q8H  . lidocaine      . midazolam      .  morphine injection  4 mg Intravenous Once  . nitroGLYCERIN      . ondansetron (ZOFRAN) IV  4 mg Intravenous Once  . pantoprazole  40 mg Oral Q1200  . sodium chloride  3 mL Intravenous Q12H   . sodium chloride 100 mL/hr at 12/10/11 1522    Physical Exam: Temp:  [97.6 F (36.4 C)-98.3 F (36.8 C)] 97.7 F (36.5 C) (05/15 0527) Pulse Rate:  [43-65] 48  (05/15 0527) Resp:  [17-22] 18  (05/15 0527) BP: (103-148)/(60-98) 107/64 mmHg (05/15 0527) SpO2:  [93 %-100 %] 98 % (05/15 0527) Weight:  [180 lb (81.647 kg)-183 lb 6.8 oz (83.2 kg)] 183 lb 6.8 oz (83.2 kg) (05/14 1520)  General: Pleasant middle-aged black male, in no acute distress. Head: Normocephalic, atraumatic, sclera non-icteric, nares are without discharge.  Neck: Supple. Negative for carotid bruits or JVD Lungs: Diminished throughout without wheezes, rales, or rhonchi. Breathing is unlabored. Heart: RRR S1 S2 without murmurs, rubs, or gallops.  Abdomen: Soft, non-tender, non-distended with normoactive bowel sounds. No rebound/guarding. No obvious abdominal masses. Msk:  Strength and tone appear normal for age. Extremities: No edema. No clubbing or cyanosis. Distal pedal pulses are intact and equal bilaterally. Neuro: Alert and oriented X 3. Moves all extremities spontaneously. Psych:  Responds to questions appropriately with a normal affect.   Intake/Output Summary (Last 24 hours) at 12/11/11  0751 Last data filed at 12/11/11 4782  Gross per 24 hour  Intake   2815 ml  Output   1000 ml  Net   1815 ml    Labs:  Bdpec Asc Show Low 12/11/11 0257 12/10/11 1509 12/10/11 1232  NA 139 -- 139  K 3.6 -- 3.4*  CL 107 -- 105  CO2 26 -- 24  GLUCOSE 93 -- 90  BUN 12 -- 11  CREATININE 1.03 0.82 --  CALCIUM 8.6 -- 9.2   Basename 12/10/11 1509  AST 22  ALT 11  ALKPHOS 80  BILITOT 0.4  PROT 6.6  ALBUMIN 3.7   Basename 12/10/11 1509 12/10/11 1232  WBC 5.1 5.3  NEUTROABS -- 2.0  HGB 11.8* 12.2*  HCT 35.3* 36.5*  MCV 89.8 89.5  PLT 170 182   Basename 12/11/11 0257 12/10/11 2111 12/10/11 1509  CKTOTAL 298* 359* 409*  CKMB 3.6 4.0 4.8*  TROPONINI <0.30 <0.30 <0.30   Basename 12/10/11 1500  HGBA1C 5.5   Basename 12/10/11 1459  CHOL 151  HDL 35*  LDLCALC 95  TRIG 956  CHOLHDL 4.3   Basename 12/10/11 1509  TSH 0.634     Radiology/Studies:   12/10/11 - 2D Echocardiogram Study Conclusions:  Left ventricle: The cavity size was normal. Wall thickness was normal. Systolic function was normal. The estimated ejection fraction was in the range of 55% to 60%. Pericardium: There was  no pericardial effusion.  12/11/2011 - CXR Findings: Normal heart size and pulmonary vascularity.  No focal airspace consolidation in the lungs.  No blunting of costophrenic angles.  No pneumothorax.  Degenerative changes in the spine.  No significant change since previous study.  IMPRESSION: No evidence of active pulmonary disease.    12/10/11 - Cardiac Cath Hemodynamics:  AO 112/70  LV 112/16  Coronary angiography:  Coronary dominance: right  Left mainstem: Short, no angiographic coronary disease.  Left anterior descending (LAD): No angiographic coronary disease.  Left circumflex (LCx): No angiographic coronary disease.  Right coronary artery (RCA): Relatively small vessel, no angiographic coronary disease.  Left ventriculography: Left ventricular systolic function is normal, LVEF is estimated at  55%, there is no significant mitral regurgitation  Final Conclusions: Normal coronaries. Normal LV systolic function. Possible early repolarization versus pericarditis pattern on ECG (chest pain is pleuritic). Will plan to treat with Ibuprofen and PPI for now. Awaiting labs including BMET, CBC, cardiac enzymes.     Assessment and Plan  41 y.o. male w/ no previous PMHx who presented to [facility] on 12/10/11 with complaints of chest pain and EKG w/ anterior ST elevations.  1. Chest Pain: Cardiac cath with normal coronaries and LV systolic function. Echo with normal LV function, EF 55-60%, no valvular abnormalities or pericardial effusion. CXR without cardiopulmonary abnormalities. Cardiac enzymes with normal troponin and CKMB, elevated CK. CBC, BMET, and TSH grossly normal. ST elevation may be early repolarization (seen in EKG from April as well) or possibly pericarditis. Patient is afebrile and no pericardial effusion seen on echo. Chest pain last night relieved with tylenol. Cont to treat with ibuprofen for possible pericarditis or musculosketal pain and PPI for GI protection and possible GI etiology of pain. He reports frequent heartburn at home. If not improved with PPI consider outpatient GI evaluation.  Disposition: Home today if ok with Dr. Kirke Corin.  Signed, Micaella Gitto PA-C

## 2011-12-11 NOTE — Discharge Summary (Signed)
Discharge Summary   Patient ID: COLEMAN KALAS MRN: [id number], DOB/AGE: 01-04-1971 41 y.o.  Primary MD: None Primary Cardiologist: Dr. Shirlee Latch Admit date: 12/10/2011 D/C date:     12/11/2011      Primary Discharge Diagnoses:  1. Chest pain, Likely pericarditis or GI in etiology  - ST elevation on EKG felt early repolarization vs pericarditis  - Troponins/MB normal with elevated CK  - Cardiac cath with normal coronaries and LV systolic function  - Echo with normal LV function, EF 55-60%, no valvular abnormalities or pericardial effusion  - Initiated on ibuprofen and PPI  - For outpt f/u with Cardiology and PCP  Secondary Discharge Diagnoses:  1. GERD   Allergies No Known Allergies  Diagnostic Studies/Procedures:   12/10/11 - 2D Echocardiogram  Study Conclusions: Left ventricle: The cavity size was normal. Wall thickness was normal. Systolic function was normal. The estimated ejection fraction was in the range of 55% to 60%. Pericardium: There was no pericardial effusion.  12/10/11 - Cardiac Cath  Hemodynamics:  AO 112/70  LV 112/16  Coronary angiography:  Coronary dominance: right  Left mainstem: Short, no angiographic coronary disease.  Left anterior descending (LAD): No angiographic coronary disease.  Left circumflex (LCx): No angiographic coronary disease.  Right coronary artery (RCA): Relatively small vessel, no angiographic coronary disease.  Left ventriculography: Left ventricular systolic function is normal, LVEF is estimated at 55%, there is no significant mitral regurgitation  Final Conclusions: Normal coronaries. Normal LV systolic function. Possible early repolarization versus pericarditis pattern on ECG (chest pain is pleuritic). Will plan to treat with Ibuprofen and PPI for now. Awaiting labs including BMET, CBC, cardiac enzymes.   History of Present Illness: 41 y.o. male w/ no PMHx who presented to [facility] on 12/10/11 with complaints of chest pain  and anterior ST elevation.  The patient reported having intermittent chest pain for about the last year. He was seen and evaluated in the [facility] ED on 11/25/11 for complaints of chest pain at which time was pleuritic in nature. DDimer and cardiac enzymes were normal. ST elevation in V2-V4 felt 2/2 LVH. Patient was discharged with plans for follow up with his PCP. He had recurrent pain on the day of presentation and initially went to the [facility] urgent care where his EKG showed anterior ST elevation for which a Code STEMI was called and he was brought to [facility] cath lab.   Hospital Course: He underwent cardiac cath on 12/10/11 revealing normal coronaries and LV systolic function. He tolerated the procedure well without complications. It was felt his EKG findings may be from early repolarization or possibly pericarditis. He was initiated on ibuprofen and PPI. Echocardiogram completed on 12/10/11 showed normal LV function, EF 55-60%, no valvular abnormalities or pericardial effusion. CXR was without cardiopulmonary abnormalities. Cardiac enzymes with normal troponin and CKMB, but elevated CK. CBC, BMET, and TSH were grossly normal.  Patient was afebrile, hemodynamically stable, and without pericardial effusion on echo. He had one episode of recurrent chest pain that was relieved with tylenol. He will be continued on ibuprofen 600mg  TID for one week for possible pericarditis or musculosketal pain and continued on a PPI for GI protection and possible GI etiology of pain.   He was seen and evaluated by Dr. Kirke Corin who felt he was stable for discharge home with plans for follow up as scheduled below.  Discharge Vitals: Blood pressure 107/64, pulse 48, temperature 97.7 F (36.5 C), temperature source Oral, resp. rate 18, height 5'  9" (1.753 m), weight 183 lb 6.8 oz (83.2 kg), SpO2 98.00%.  Labs: Component Value Date   WBC 5.1 12/10/2011   HGB 11.8* 12/10/2011   HCT 35.3* 12/10/2011   MCV 89.8 12/10/2011   PLT 170 12/10/2011      Lab 12/11/11 0257 12/10/11 1509  NA 139 --  K 3.6 --  CL 107 --  CO2 26 --  BUN 12 --  CREATININE 1.03 --  CALCIUM 8.6 --  PROT -- 6.6  BILITOT -- 0.4  ALKPHOS -- 80  ALT -- 11  AST -- 22  GLUCOSE 93 --   Basename 12/11/11 0257 12/10/11 2111 12/10/11 1509  CKTOTAL 298* 359* 409*  CKMB 3.6 4.0 4.8*  TROPONINI <0.30 <0.30 <0.30   Component Value Date   CHOL 151 12/10/2011   HDL 35* 12/10/2011   LDLCALC 95 12/10/2011   TRIG 106 12/10/2011    12/10/2011 15:00  Hemoglobin A1C 5.5     12/10/2011 15:09  TSH 0.634     Discharge Medications   Medication List  As of 12/11/2011 11:53 AM   STOP taking these medications         aspirin 325 MG buffered tablet         TAKE these medications         HYDROcodone-acetaminophen 5-500 MG per tablet   Commonly known as: VICODIN   Take 1 tablet by mouth every 6 (six) hours as needed. For pain      ibuprofen 600 MG tablet   Commonly known as: ADVIL,MOTRIN   Take 1 tablet (600 mg total) by mouth every 8 (eight) hours.      naphazoline 0.012 % ophthalmic solution   Commonly known as: CLEAR EYES   Place 1 drop into both eyes daily as needed. For dry eyes      pantoprazole 40 MG tablet   Commonly known as: PROTONIX   Take 1 tablet (40 mg total) by mouth daily at 12 noon.            Disposition   Discharge Orders    Future Appointments: Provider: Department: Dept Phone: Center:   12/27/2011 11:30 AM Ok Anis, NP [facility] [phone] [facility]     Future Orders Please Complete By Expires   Diet - low sodium heart healthy      Increase activity slowly      Discharge instructions      Comments:   **PLEASE REMEMBER TO BRING ALL OF YOUR MEDICATIONS TO EACH OF YOUR FOLLOW-UP OFFICE VISITS.  * KEEP GROIN SITE CLEAN AND DRY. Call the office for any signs of bleedings, pus, swelling, increased pain, or any other concerns. * NO HEAVY LIFTING (>10lbs) OR SEXUAL ACTIVITY X 7 DAYS. * NO DRIVING X 2-3  DAYS. * NO SOAKING BATHS, HOT TUBS, POOLS, ETC., X 7 DAYS.  * Please establish care with a primary care provider as soon as possible.  * Please take ibuprofen as prescribed for one week then stop. Do not take other NSAIDs (ASA, Aleve, etc) while taking ibuprofen. It is ok to take tylenol.      Follow-up Information    Follow up with Nicolasa Ducking, NP on 12/27/2011. (11:30)    Contact information:   [facility] [address] [place] [address] [phone]       Follow up with Please establish care with a primary care provider and make appiontment as soon as possible.  Outstanding Labs/Studies:  None  Duration of Discharge Encounter: Greater than 30 minutes including physician and PA time.  Signed, Wavie Hashimi PA-C 12/11/2011, 11:53 AM

## 2011-12-16 ENCOUNTER — Encounter: Payer: Self-pay | Admitting: *Deleted

## 2011-12-23 ENCOUNTER — Telehealth: Payer: Self-pay | Admitting: Physician Assistant

## 2011-12-23 NOTE — Telephone Encounter (Signed)
Pt stated he is having severe chest pain daily since leaving the hospital. He has run out of the ibuprofen and does not have any hydrocodone at home. His follow up appointment is not until the 31st. He was requesting pain meds.  Reviewed the records. Pt had a clean cath and pain was felt to be from pericarditis. EF was normal as well.   Advised patient I could call in a refill of his ibuprofen but no narcotics. Tylenol is also OK to take if the amount is not high. Advised patient that Tylenol and ibuprofen together can damage the liver, especially in the setting of alcohol use. The patient and his girlfriend advised they would be careful and he would not drink. A refill on his ibuprofen, 600 mg 3 times a day, #21 was called in.

## 2011-12-27 ENCOUNTER — Encounter: Payer: Self-pay | Admitting: Nurse Practitioner

## 2011-12-27 ENCOUNTER — Ambulatory Visit (INDEPENDENT_AMBULATORY_CARE_PROVIDER_SITE_OTHER)
Admission: RE | Admit: 2011-12-27 | Discharge: 2011-12-27 | Disposition: A | Discharge status: Home / Self Care | Payer: BC Managed Care – PPO | Source: Ambulatory Visit | Attending: Nurse Practitioner | Admitting: Nurse Practitioner

## 2011-12-27 ENCOUNTER — Ambulatory Visit (INDEPENDENT_AMBULATORY_CARE_PROVIDER_SITE_OTHER): Payer: BC Managed Care – PPO | Admitting: Nurse Practitioner

## 2011-12-27 VITALS — BP 122/80 | HR 50 | Ht 69.0 in | Wt 168.0 lb

## 2011-12-27 DIAGNOSIS — R079 Chest pain, unspecified: Secondary | ICD-10-CM

## 2011-12-27 DIAGNOSIS — R109 Unspecified abdominal pain: Secondary | ICD-10-CM

## 2011-12-27 DIAGNOSIS — R072 Precordial pain: Secondary | ICD-10-CM

## 2011-12-27 DIAGNOSIS — R9431 Abnormal electrocardiogram [ECG] [EKG]: Secondary | ICD-10-CM

## 2011-12-27 DIAGNOSIS — R001 Bradycardia, unspecified: Secondary | ICD-10-CM

## 2011-12-27 DIAGNOSIS — I319 Disease of pericardium, unspecified: Secondary | ICD-10-CM

## 2011-12-27 DIAGNOSIS — R0789 Other chest pain: Secondary | ICD-10-CM | POA: Insufficient documentation

## 2011-12-27 DIAGNOSIS — I498 Other specified cardiac arrhythmias: Secondary | ICD-10-CM

## 2011-12-27 MED ORDER — COLCHICINE 0.6 MG PO TABS: 0.6000 mg | ORAL_TABLET | Freq: Two times a day (BID) | ORAL | Status: DC

## 2011-12-27 NOTE — Progress Notes (Signed)
Patient Name: Chris Fox Date of Encounter: 12/27/2011  Primary Cardiologist:  Golden Circle, MD  Patient Profile  41 year old male with recent admission for chest pain and possible pericarditis who presents for followup.  Problem List   Past Medical History  Diagnosis Date  . Headache   . Midsternal chest pain     a. Cardiac cath 12/10/11 with normal coronaries and LV systolic function;  11/2011 Echo EF 55-60%, no valvular abnormalities or pericardial effusion  . ST elevation     a. Anterior ST elevation ? early repolarization vs pericarditis  . GERD (gastroesophageal reflux disease)   . Abdominal pain     a. LLQ 11/2011  . Pericarditis     a. presumed - 11/2011   Past Surgical History  Procedure Date  . Cardiac catheterization 12/10/11    normal coronaries and LV systolic function;  Echo with normal LV function, EF 55-60%, no valvular abnormalities or pericardial effusion    Allergies  No Known Allergies  HPI  41 year old male the above problem list.  He was recently admitted secondary to chest pain.  He was noted to have anterior ST segment elevation which was felt to be most consistent with LVH and early repolarization.  He did undergo echocardiogram showed normal LV function without evidence of pericardial effusion.  Diagnostic catheterization was also performed and showed normal coronary arteries.  It was felt that his symptoms were likely related to pericarditis and he was placed on NSAID therapy.  Since his discharge his continued to have midsternal chest discomfort is worsened by lying flat and also by taking deep breaths.  It is not any worse with palpation.  He denies any dyspnea exertion, PND, orthopnea, dizziness, syncope, edema, or early satiety.  He also reports left lower quadrant pain and tenderness.  He has had normal bowel movements.  Home Medications  Prior to Admission medications   Medication Sig Start Date End Date Taking? Authorizing Provider    ibuprofen (ADVIL,MOTRIN) 600 MG tablet as needed. 12/23/11  Yes Historical Provider, MD  pantoprazole (PROTONIX) 40 MG tablet Take 1 tablet (40 mg total) by mouth daily at 12 noon. 12/11/11 12/10/12 Yes Jessica A Hope, PA-C  colchicine 0.6 MG tablet Take 1 tablet (0.6 mg total) by mouth 2 (two) times daily. 12/27/11 12/26/12  Ok Anis, NP   Review of Systems Chest and left lower quadrant pain as outlined above. All other systems reviewed and are otherwise negative except as noted above.  Physical Exam  Blood pressure 122/80, pulse 50, height 5\' 9"  (1.753 m), weight 168 lb (76.204 kg).  General: Pleasant, NAD Psych: Normal affect. Neuro: Alert and oriented X 3. Moves all extremities spontaneously. HEENT: Normal  Neck: Supple without bruits or JVD. Lungs:  Resp regular and unlabored, CTA. Heart: RRR no s3, s4, or murmurs.  no rub. Abdomen: Soft, non-tender, non-distended, BS + x 4.  Extremities: No clubbing, cyanosis or edema. DP/PT/Radials 2+ and equal bilaterally.  Right wrist catheter site is without bleeding, bruit, or hematoma.  Accessory Clinical Findings  ECG - sinus bradycardia, 50, LDH, early repolarization.  No acute ST or T changes.  Assessment & Plan  1.  Midsternal chest pain/pericarditis:  Patient continues to have symptoms of pleuritic chest discomfort that are worsened by lying flat.  EKG is unchanged.  He has no rub on exam and a recent normal echo and catheter.  He remains on NSAID therapy as well as a PPI and have given him a  prescription for a short course of colchicine to see if this helps.  He'll followup Dr. Shirlee Latch in a few weeks.  2.  Left lower quadrant pain:  Unclear etiology.  His abnormal bowel movements without bright red blood per dark stools.  He does not have primary care doctor.  We will obtain plain films of his abdomen and affect normal to GI.  Nicolasa Ducking, NP 12/27/2011, 5:15 PM

## 2011-12-27 NOTE — Patient Instructions (Signed)
Your physician recommends that you schedule a follow-up appointment in: 1 month with Dr Shirlee Latch Your physician has recommended you make the following change in your medication: START Colchicine 0.6 mg twice daily for 14 days  Your physician recommends that you have a x ray of your abdomen taken

## 2011-12-30 ENCOUNTER — Telehealth: Payer: Self-pay | Admitting: Cardiology

## 2011-12-30 NOTE — Telephone Encounter (Signed)
New problem :  Patient calling still C/O chest pain & wants to why he still having pain.

## 2011-12-30 NOTE — Telephone Encounter (Signed)
Pt unable to take colchicine due to rash. I reviewed with Ward Givens.  Pt should follow-up with PCP for further evaluation of chest pain. I spoke with pt and he is aware of recommendations

## 2011-12-30 NOTE — Telephone Encounter (Signed)
Reviewed with Solon Palm. He recommended pt continue ibuprofen, stay off colchicine, and follow-up  with PCP. I spoke with pt and he is aware of recommendations.

## 2011-12-30 NOTE — Telephone Encounter (Signed)
Spoke with pt. Pt started colchicine on Friday 12/27/11. Pt states about an hour after he took colchicine he developed a rash on his left arm and neck. He also developed abdominal pain. Pt states he only took one dose of colchicine and his rash is getting better.

## 2011-12-30 NOTE — Telephone Encounter (Signed)
Pt states he is still having chest pain and is asking what he should do. I will forward to [facility], for review and recommendations.

## 2011-12-30 NOTE — Telephone Encounter (Signed)
New problem:  Patient calling, c/o sided effect from medication  Colchicine 0.6 mg on neck an arm pain in stomach.

## 2012-01-23 ENCOUNTER — Other Ambulatory Visit: Payer: Self-pay | Admitting: Gastroenterology

## 2012-01-23 DIAGNOSIS — R109 Unspecified abdominal pain: Secondary | ICD-10-CM

## 2012-01-27 ENCOUNTER — Ambulatory Visit
Admission: RE | Admit: 2012-01-27 | Discharge: 2012-01-27 | Disposition: A | Discharge status: Home / Self Care | Payer: BC Managed Care – PPO | Source: Ambulatory Visit | Attending: Gastroenterology | Admitting: Gastroenterology

## 2012-01-27 DIAGNOSIS — R109 Unspecified abdominal pain: Secondary | ICD-10-CM

## 2012-01-27 MED ORDER — IOHEXOL 300 MG/ML  SOLN
100.0000 mL | Freq: Once | INTRAMUSCULAR | Status: AC | PRN
  Administered 2012-01-27: 100 mL via INTRAVENOUS

## 2012-01-29 ENCOUNTER — Ambulatory Visit: Payer: BC Managed Care – PPO | Admitting: Cardiology

## 2012-04-03 NOTE — Addendum Note (Signed)
Addended by: Micki Riley C on: 04/03/2012 04:39 PM   Modules accepted: Orders

## 2013-04-12 ENCOUNTER — Emergency Department (HOSPITAL_COMMUNITY): Payer: BC Managed Care – PPO

## 2013-04-12 ENCOUNTER — Encounter (HOSPITAL_COMMUNITY): Payer: Self-pay | Admitting: *Deleted

## 2013-04-12 ENCOUNTER — Emergency Department (HOSPITAL_COMMUNITY)
Admission: EM | Admit: 2013-04-12 | Discharge: 2013-04-12 | Disposition: A | Discharge status: Home / Self Care | Payer: BC Managed Care – PPO | Attending: Emergency Medicine | Admitting: Emergency Medicine

## 2013-04-12 DIAGNOSIS — Z79899 Other long term (current) drug therapy: Secondary | ICD-10-CM | POA: Insufficient documentation

## 2013-04-12 DIAGNOSIS — R0789 Other chest pain: Secondary | ICD-10-CM

## 2013-04-12 DIAGNOSIS — Z8679 Personal history of other diseases of the circulatory system: Secondary | ICD-10-CM | POA: Insufficient documentation

## 2013-04-12 DIAGNOSIS — Z8719 Personal history of other diseases of the digestive system: Secondary | ICD-10-CM | POA: Insufficient documentation

## 2013-04-12 DIAGNOSIS — Z87891 Personal history of nicotine dependence: Secondary | ICD-10-CM | POA: Insufficient documentation

## 2013-04-12 DIAGNOSIS — R0602 Shortness of breath: Secondary | ICD-10-CM | POA: Insufficient documentation

## 2013-04-12 DIAGNOSIS — R071 Chest pain on breathing: Secondary | ICD-10-CM | POA: Insufficient documentation

## 2013-04-12 LAB — BASIC METABOLIC PANEL
BUN: 9 mg/dL (ref 6–23)
Calcium: 9.3 mg/dL (ref 8.4–10.5)
Creatinine, Ser: 0.94 mg/dL (ref 0.50–1.35)
GFR calc Af Amer: >90 mL/min (ref >90)
GFR calc non Af Amer: >90 mL/min (ref >90)

## 2013-04-12 LAB — CBC
HCT: 36.3 % — ABNORMAL LOW (ref 39.0–52.0)
MCH: 31.1 pg (ref 26.0–34.0)
MCHC: 33.9 g/dL (ref 30.0–36.0)
MCV: 91.7 fL (ref 78.0–100.0)
Platelets: 192 10*3/uL (ref 150–400)
RDW: 12.1 % (ref 11.5–15.5)

## 2013-04-12 LAB — POCT I-STAT TROPONIN I: Troponin i, poc: 0 ng/mL (ref 0.00–0.08)

## 2013-04-12 MED ORDER — POTASSIUM CHLORIDE CRYS ER 20 MEQ PO TBCR
40.0000 meq | EXTENDED_RELEASE_TABLET | Freq: Once | ORAL | Status: AC
  Administered 2013-04-12: 40 meq via ORAL
  Filled 2013-04-12: qty 2

## 2013-04-12 MED ORDER — NAPROXEN 250 MG PO TABS
500.0000 mg | ORAL_TABLET | Freq: Once | ORAL | Status: AC
  Administered 2013-04-12: 500 mg via ORAL
  Filled 2013-04-12: qty 2

## 2013-04-12 MED ORDER — NAPROXEN 500 MG PO TABS: 500.0000 mg | ORAL_TABLET | Freq: Two times a day (BID) | ORAL | Status: DC

## 2013-04-12 NOTE — ED Notes (Signed)
Pt c/o left sided CP that goes straight into his back, sts it started on Saturday, has been constant since then but eased up a little bit on Sunday. Reports hx of smoking but sts he has quit. sts his family has hx of DM/HTN, his mother is here for CP too. Reports he tried goody powder but didn't get any relief. Pt in nad, skin warm and dry, resp e/u.

## 2013-04-12 NOTE — ED Notes (Signed)
Pt states since Saturday morning has been having sharp chest pains.  Pt reports sob.  Pt reports some dizziness

## 2013-04-12 NOTE — ED Provider Notes (Signed)
CSN: [id number]     Arrival date & time 04/12/13  1557 History   First MD Initiated Contact with Patient 04/12/13 1811     Chief Complaint  Patient presents with  . Chest Pain   (Consider location/radiation/quality/duration/timing/severity/associated sxs/prior Treatment) HPI Comments: 42 year old male the past medical history of GERD, pericarditis and headaches presents to the emergency department complaining of sudden onset left-sided chest pain x3 days. Patient states Saturday morning before going to work he began to have sharp and burning left-sided chest pain rated 8/10. Pain worse when taking a deep breath in, lifting things or moving his left arm. States he occasionally feels something in the left side of his back. Admits to associated shortness of breath, states when he takes a deep breath in it is hard to catch a deep breath. He tried taking BC powder this morning which made his symptoms worse which is why he decided to come to the emergency department. Denies associated fever, chills, nausea, vomiting or diaphoresis. States he had similar chest pain last year when he was diagnosed with pericarditis in May 2013, had a cardiac cath at that time which showed normal coronary arteries, had a normal echo at that time. He has not seen a cardiologist or his primary care physician since.  Patient is a 42 y.o. male presenting with chest pain. The history is provided by the patient.  Chest Pain Associated symptoms: shortness of breath   Associated symptoms: no diaphoresis, no fever, no nausea, not vomiting and no weakness     Past Medical History  Diagnosis Date  . Headache(784.0)   . Midsternal chest pain     a. Cardiac cath 12/10/11 with normal coronaries and LV systolic function;  11/2011 Echo EF 55-60%, no valvular abnormalities or pericardial effusion  . ST elevation     a. Anterior ST elevation ? early repolarization vs pericarditis  . GERD (gastroesophageal reflux disease)   . Abdominal  pain     a. LLQ 11/2011  . Pericarditis     a. presumed - 11/2011   Past Surgical History  Procedure Laterality Date  . Cardiac catheterization  12/10/11    normal coronaries and LV systolic function;  Echo with normal LV function, EF 55-60%, no valvular abnormalities or pericardial effusion   Family History  Problem Relation Age of Onset  . Diabetes Maternal Grandmother    History  Substance Use Topics  . Smoking status: Former Smoker -- 1.50 packs/day for 21 years    Types: Cigarettes    Quit date: 09/11/2002  . Smokeless tobacco: Not on file  . Alcohol Use: No    Review of Systems  Constitutional: Negative for fever, chills, diaphoresis and activity change.  Respiratory: Positive for shortness of breath.   Cardiovascular: Positive for chest pain.  Gastrointestinal: Negative for nausea and vomiting.  Neurological: Negative for weakness.  Psychiatric/Behavioral: Negative for confusion.  All other systems reviewed and are negative.    Allergies  Colchicine  Home Medications   Current Outpatient Rx  Name  Route  Sig  Dispense  Refill  . Aspirin-Salicylamide-Caffeine (BC HEADACHE POWDER PO)   Oral   Take 1 packet by mouth daily as needed. For pain          BP 105/55  Pulse 48  Temp(Src) 98.4 F (36.9 C) (Oral)  Resp 16  Ht 5\' 8"  (1.727 m)  Wt 168 lb 12.8 oz (76.567 kg)  BMI 25.67 kg/m2  SpO2 98% Physical Exam  Nursing note and  vitals reviewed. Constitutional: He is oriented to person, place, and time. He appears well-developed and well-nourished. No distress.  HENT:  Head: Normocephalic and atraumatic.  Mouth/Throat: Oropharynx is clear and moist.  Eyes: Conjunctivae and EOM are normal. Pupils are equal, round, and reactive to light.  Neck: Normal range of motion. Neck supple. No JVD present.  Cardiovascular: Normal rate, regular rhythm and normal heart sounds.   Pulmonary/Chest: Effort normal and breath sounds normal. He exhibits tenderness.     Abdominal: Soft. Bowel sounds are normal. There is no tenderness.  Musculoskeletal: Normal range of motion. He exhibits no edema.       Back:  Left sided chest pain exacerbated by moving left shoulder.  Neurological: He is alert and oriented to person, place, and time. He has normal strength. No sensory deficit.  Skin: Skin is warm and dry. No rash noted. He is not diaphoretic.  Psychiatric: He has a normal mood and affect. His behavior is normal.    ED Course  Procedures (including critical care time) Labs Review Labs Reviewed  CBC - Abnormal; Notable for the following:    RBC 3.96 (*)    Hemoglobin 12.3 (*)    HCT 36.3 (*)    All other components within normal limits  BASIC METABOLIC PANEL - Abnormal; Notable for the following:    Potassium 3.2 (*)    All other components within normal limits  POCT I-STAT TROPONIN I    Date: 04/12/2013  Rate: 61  Rhythm: normal sinus rhythm  QRS Axis: normal  Intervals: normal  ST/T Wave abnormalities: nonspecific ST changes  Conduction Disutrbances:none  Narrative Interpretation: NSR, no sig changes since EKG obtained on 12/10/2011  Old EKG Reviewed: unchanged   Imaging Review Dg Chest 2 View  04/12/2013   CLINICAL DATA:  Chest pain  EXAM: CHEST  2 VIEW  COMPARISON:  Dec 10, 2011.  FINDINGS: Lungs are clear. Heart size and pulmonary vascularity are normal. No adenopathy. No pneumothorax. No bone lesions. .  IMPRESSION: No abnormality noted.   Electronically Signed   By: Bretta Bang   On: 04/12/2013 16:36    MDM   1. Chest wall pain     Patient with reproducible chest pain. EKG without any changes from prior. Labs obtained in triage prior to patient being seen- cbc, bmp, troponin. K 3.2, otherwise unremarkable. Troponin negative. 40 mEq by mouth potassium given. Chest x-ray unremarkable. I do not feel patient's symptoms are cardiac or pulmonary related. PERC negative. He is afebrile with normal vital signs and appears  comfortable in no apparent distress. Advised him to take naproxen for pain, avoid heavy lifting or hard physical activity. He will followup with his primary care physician. Return precautions discussed. Patient states understanding of plan and is agreeable.    Trevor Mace, PA-C 04/12/13 1859  Trevor Mace, PA-C 04/12/13 1901

## 2013-04-13 NOTE — ED Provider Notes (Signed)
Medical screening examination/treatment/procedure(s) were performed by non-physician practitioner and as supervising physician I was immediately available for consultation/collaboration.  Flint Melter, MD 04/13/13 [phone]

## 2013-06-17 ENCOUNTER — Emergency Department (HOSPITAL_COMMUNITY)
Admission: EM | Admit: 2013-06-17 | Discharge: 2013-06-17 | Disposition: A | Discharge status: Home / Self Care | Payer: BC Managed Care – PPO | Attending: Emergency Medicine | Admitting: Emergency Medicine

## 2013-06-17 ENCOUNTER — Encounter (HOSPITAL_COMMUNITY): Payer: Self-pay | Admitting: Emergency Medicine

## 2013-06-17 DIAGNOSIS — Z9889 Other specified postprocedural states: Secondary | ICD-10-CM | POA: Insufficient documentation

## 2013-06-17 DIAGNOSIS — I252 Old myocardial infarction: Secondary | ICD-10-CM | POA: Insufficient documentation

## 2013-06-17 DIAGNOSIS — R42 Dizziness and giddiness: Secondary | ICD-10-CM | POA: Insufficient documentation

## 2013-06-17 DIAGNOSIS — Z87891 Personal history of nicotine dependence: Secondary | ICD-10-CM | POA: Insufficient documentation

## 2013-06-17 DIAGNOSIS — K625 Hemorrhage of anus and rectum: Secondary | ICD-10-CM | POA: Insufficient documentation

## 2013-06-17 DIAGNOSIS — Z8679 Personal history of other diseases of the circulatory system: Secondary | ICD-10-CM | POA: Insufficient documentation

## 2013-06-17 DIAGNOSIS — R079 Chest pain, unspecified: Secondary | ICD-10-CM | POA: Insufficient documentation

## 2013-06-17 LAB — CBC
HCT: 38.7 % — ABNORMAL LOW (ref 39.0–52.0)
Hemoglobin: 12.9 g/dL — ABNORMAL LOW (ref 13.0–17.0)
MCH: 31.1 pg (ref 26.0–34.0)
MCHC: 33.3 g/dL (ref 30.0–36.0)
MCV: 93.3 fL (ref 78.0–100.0)
Platelets: 193 K/uL (ref 150–400)
RBC: 4.15 MIL/uL — ABNORMAL LOW (ref 4.22–5.81)
RDW: 11.9 % (ref 11.5–15.5)
WBC: 21.7 K/uL — ABNORMAL HIGH (ref 4.0–10.5)

## 2013-06-17 LAB — BASIC METABOLIC PANEL
CO2: 27 mEq/L (ref 19–32)
Calcium: 9.3 mg/dL (ref 8.4–10.5)
Creatinine, Ser: 0.87 mg/dL (ref 0.50–1.35)
GFR calc non Af Amer: >90 mL/min (ref >90)

## 2013-06-17 LAB — POCT I-STAT TROPONIN I: Troponin i, poc: 0 ng/mL (ref 0.00–0.08)

## 2013-06-17 MED ORDER — MORPHINE SULFATE 4 MG/ML IJ SOLN
4.0000 mg | Freq: Once | INTRAMUSCULAR | Status: AC
  Administered 2013-06-17: 4 mg via INTRAVENOUS
  Filled 2013-06-17: qty 1

## 2013-06-17 MED ORDER — SODIUM CHLORIDE 0.9 % IV SOLN
1000.0000 mL | Freq: Once | INTRAVENOUS | Status: AC
  Administered 2013-06-17: 1000 mL via INTRAVENOUS

## 2013-06-17 MED ORDER — ONDANSETRON HCL 4 MG/2ML IJ SOLN
4.0000 mg | Freq: Once | INTRAMUSCULAR | Status: AC
  Administered 2013-06-17: 4 mg via INTRAVENOUS
  Filled 2013-06-17: qty 2

## 2013-06-17 MED ORDER — SODIUM CHLORIDE 0.9 % IV SOLN
1000.0000 mL | INTRAVENOUS | Status: DC
  Administered 2013-06-17: 1000 mL via INTRAVENOUS

## 2013-06-17 NOTE — ED Notes (Signed)
Pt in stating he has been having dark stools since yesterday, today started having episodes of dizziness, also c/o chest pain for "years".  EKG completed in triage. Pt states he has the dizzy spells and dark stool episodes off and on regularly as well. Pt alert and oriented, no distress noted.

## 2013-06-17 NOTE — ED Provider Notes (Addendum)
CSN: [id number]     Arrival date & time 06/17/13  1759 History   First MD Initiated Contact with Patient 06/17/13 2036     Chief Complaint  Patient presents with  . Dizziness  . Rectal Bleeding    HPI Patient reports some dark stools over the past 24 hours and also reports some bright red blood "dripping" out of his rectum.  He states his had a colonoscopy before.  He's never required blood transfusion.  He denies use of anticoagulants.  Denies abdominal pain.  He states he's had chronic and recurrent chest pain for a long time he continues to have it at this time.  He has a normal heart catheterization in the past year.  He states his bleeding seems to be resolving.  He does report some mild lightheadedness when standing.  He denies cough or congestion.  No fever or chills.   Past Medical History  Diagnosis Date  . Headache(784.0)   . Midsternal chest pain     a. Cardiac cath 12/10/11 with normal coronaries and LV systolic function;  11/2011 Echo EF 55-60%, no valvular abnormalities or pericardial effusion  . ST elevation     a. Anterior ST elevation ? early repolarization vs pericarditis  . GERD (gastroesophageal reflux disease)   . Abdominal pain     a. LLQ 11/2011  . Pericarditis     a. presumed - 11/2011   Past Surgical History  Procedure Laterality Date  . Cardiac catheterization  12/10/11    normal coronaries and LV systolic function;  Echo with normal LV function, EF 55-60%, no valvular abnormalities or pericardial effusion   Family History  Problem Relation Age of Onset  . Diabetes Maternal Grandmother    History  Substance Use Topics  . Smoking status: Former Smoker -- 1.50 packs/day for 21 years    Types: Cigarettes    Quit date: 09/11/2002  . Smokeless tobacco: Not on file  . Alcohol Use: No    Review of Systems  All other systems reviewed and are negative.    Allergies  Colchicine  Home Medications   Current Outpatient Rx  Name  Route  Sig  Dispense   Refill  . Aspirin-Salicylamide-Caffeine (BC HEADACHE POWDER PO)   Oral   Take 1 packet by mouth 2 (two) times daily as needed (pain).          . Naphazoline HCl (CLEAR EYES OP)   Both Eyes   Place 1 drop into both eyes 3 (three) times daily as needed (redness, dryness).          BP 117/70  Pulse 52  Temp(Src) 99.1 F (37.3 C) (Oral)  Resp 14  SpO2 100% Physical Exam  Nursing note and vitals reviewed. Constitutional: He is oriented to person, place, and time. He appears well-developed and well-nourished.  HENT:  Head: Normocephalic and atraumatic.  Eyes: EOM are normal.  Neck: Normal range of motion.  Cardiovascular: Normal rate, regular rhythm, normal heart sounds and intact distal pulses.   Pulmonary/Chest: Effort normal and breath sounds normal. No respiratory distress.  Abdominal: Soft. He exhibits no distension. There is no tenderness.  Genitourinary:  Small nonbleeding external hemorrhoid.  No obvious internal hemorrhoids palpated.  Stool brown.  No gross blood.  Hemoccult negative  Musculoskeletal: Normal range of motion.  Neurological: He is alert and oriented to person, place, and time.  Skin: Skin is warm and dry.  Psychiatric: He has a normal mood and affect. Judgment normal.  ED Course  Procedures (including critical care time) Labs Review Labs Reviewed  CBC - Abnormal; Notable for the following:    WBC 21.7 (*)    RBC 4.15 (*)    Hemoglobin 12.9 (*)    HCT 38.7 (*)    All other components within normal limits  BASIC METABOLIC PANEL - Abnormal; Notable for the following:    Glucose, Bld 120 (*)    All other components within normal limits  POCT I-STAT TROPONIN I   Imaging Review No results found.  EKG Interpretation    Date/Time:  Thursday June 17 2013 18:06:53 EST Ventricular Rate:  69 PR Interval:  128 QRS Duration: 94 QT Interval:  370 QTC Calculation: 396 R Axis:   72 Text Interpretation:  Normal sinus rhythm with sinus arrhythmia  Right atrial enlargement Borderline ECG No significant change was found Confirmed by Emry Barbato  MD, Dimetrius Montfort ([id number]) on 06/17/2013 11:09:33 PM            MDM   1. Rectal bleeding    No active bleeding.  Discharge home with GI followup.  Nonspecific white blood cell count.  No indication for additional treatment or testing here in the ER.  No indication for admission.  Discharge home in good condition.  He understands return to the ER for new or worsening symptoms    Lyanne Co, MD 06/17/13 2308  Lyanne Co, MD 06/17/13 [phone]

## 2013-06-18 ENCOUNTER — Other Ambulatory Visit: Payer: Self-pay

## 2013-07-18 IMAGING — CR DG CHEST 1V PORT
1 series · 1 of 1 positions shown · non-contrast
Comparison: No priors.

CLINICAL DATA: Right-sided chest pain and arm numbness.

PORTABLE CHEST - 1 VIEW

[view not recorded]
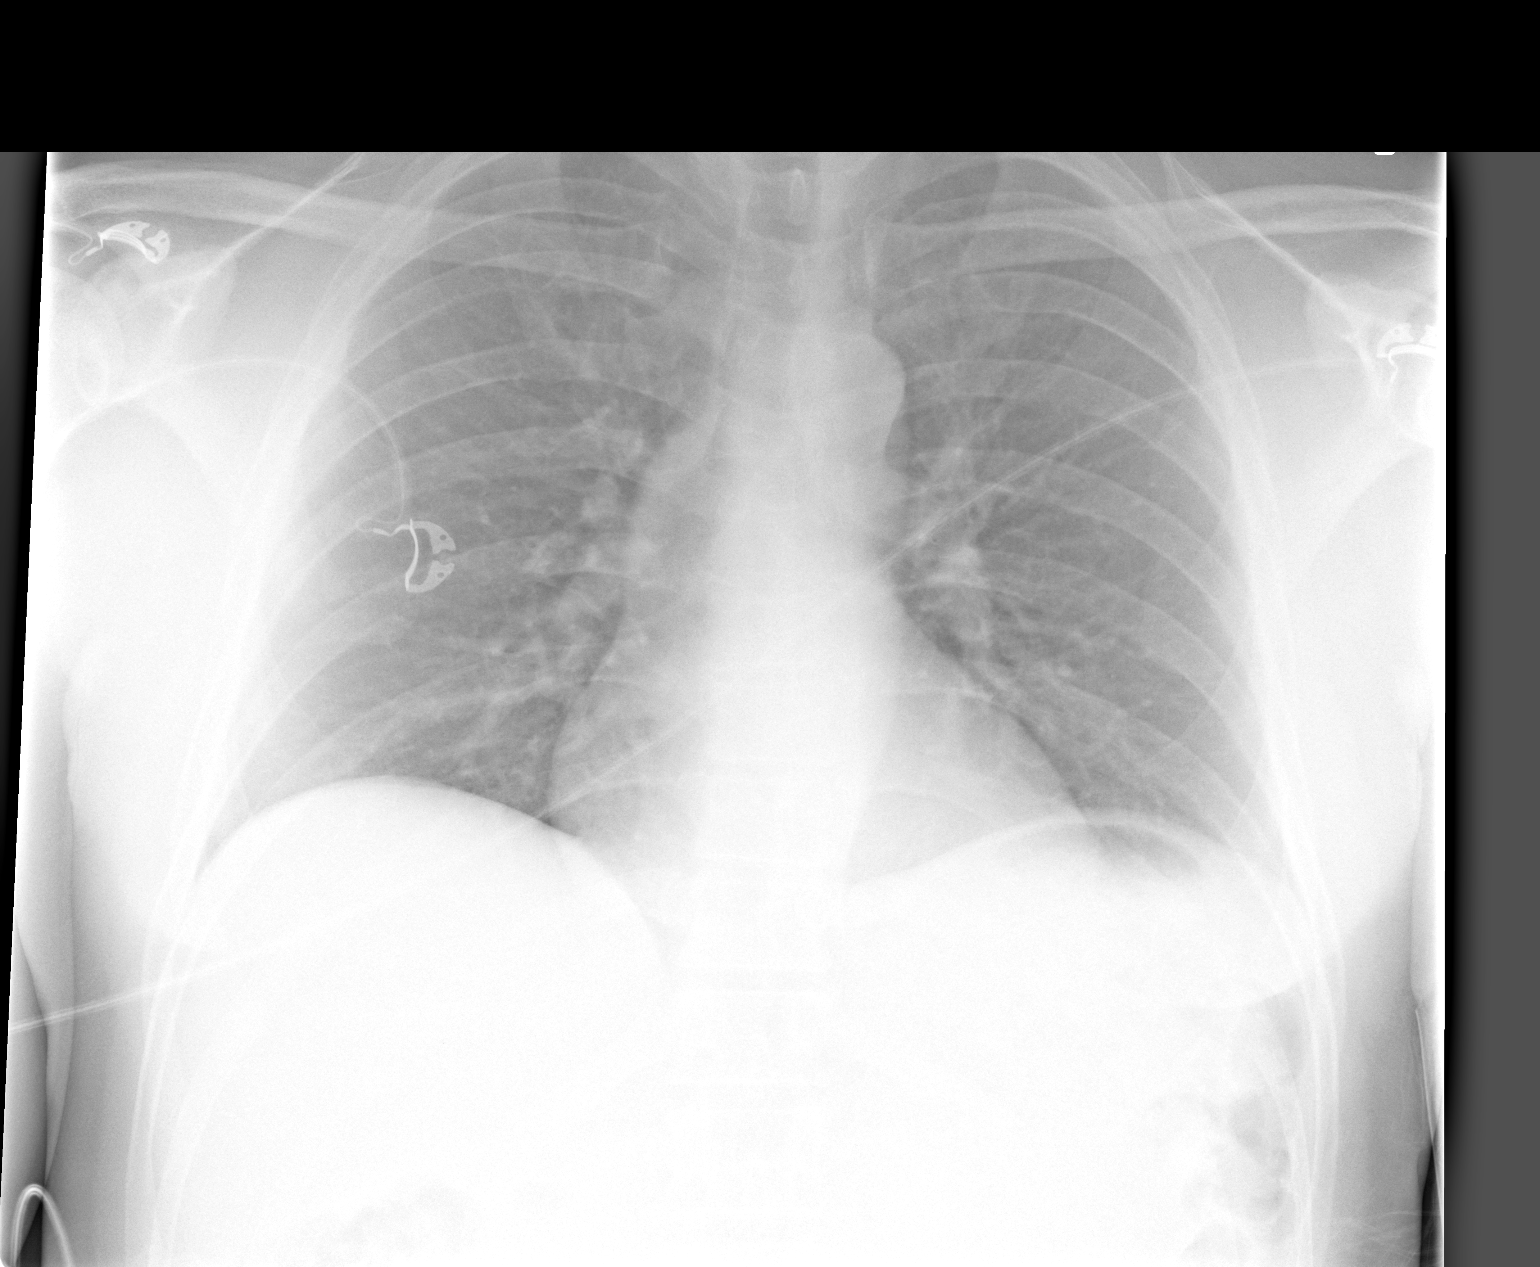

[1 of 1 positions shown; findings below may reference images not displayed]

FINDINGS: Lung volumes are normal.  No consolidative airspace
disease.  No pleural effusions.  No pneumothorax.  No pulmonary
nodule or mass noted.  Pulmonary vasculature and the
cardiomediastinal silhouette are within normal limits.
IMPRESSION: 1. No radiographic evidence of acute cardiopulmonary disease.

## 2014-07-07 ENCOUNTER — Encounter (HOSPITAL_COMMUNITY): Payer: Self-pay | Admitting: Cardiology

## 2014-10-31 ENCOUNTER — Encounter (HOSPITAL_COMMUNITY): Payer: Self-pay

## 2014-10-31 ENCOUNTER — Emergency Department (HOSPITAL_COMMUNITY): Payer: BLUE CROSS/BLUE SHIELD

## 2014-10-31 ENCOUNTER — Emergency Department (HOSPITAL_COMMUNITY)
Admission: EM | Admit: 2014-10-31 | Discharge: 2014-10-31 | Disposition: A | Discharge status: Home / Self Care | Payer: BLUE CROSS/BLUE SHIELD | Attending: Emergency Medicine | Admitting: Emergency Medicine

## 2014-10-31 DIAGNOSIS — R001 Bradycardia, unspecified: Secondary | ICD-10-CM | POA: Diagnosis not present

## 2014-10-31 DIAGNOSIS — Z79899 Other long term (current) drug therapy: Secondary | ICD-10-CM | POA: Diagnosis not present

## 2014-10-31 DIAGNOSIS — Z87891 Personal history of nicotine dependence: Secondary | ICD-10-CM | POA: Insufficient documentation

## 2014-10-31 DIAGNOSIS — I319 Disease of pericardium, unspecified: Secondary | ICD-10-CM | POA: Insufficient documentation

## 2014-10-31 DIAGNOSIS — R079 Chest pain, unspecified: Secondary | ICD-10-CM | POA: Diagnosis present

## 2014-10-31 LAB — CBC
HEMATOCRIT: 39.2 % (ref 39.0–52.0)
Hemoglobin: 12.8 g/dL — ABNORMAL LOW (ref 13.0–17.0)
MCH: 30.2 pg (ref 26.0–34.0)
MCHC: 32.7 g/dL (ref 30.0–36.0)
MCV: 92.5 fL (ref 78.0–100.0)
PLATELETS: 203 10*3/uL (ref 150–400)
RBC: 4.24 MIL/uL (ref 4.22–5.81)
RDW: 11.9 % (ref 11.5–15.5)
WBC: 4.2 10*3/uL (ref 4.0–10.5)

## 2014-10-31 LAB — BASIC METABOLIC PANEL
Anion gap: 7 (ref 5–15)
BUN: 9 mg/dL (ref 6–23)
CALCIUM: 9.4 mg/dL (ref 8.4–10.5)
CHLORIDE: 105 mmol/L (ref 96–112)
CO2: 25 mmol/L (ref 19–32)
CREATININE: 0.96 mg/dL (ref 0.50–1.35)
Glucose, Bld: 90 mg/dL (ref 70–99)
Potassium: 3.4 mmol/L — ABNORMAL LOW (ref 3.5–5.1)
Sodium: 137 mmol/L (ref 135–145)

## 2014-10-31 LAB — I-STAT TROPONIN, ED: Troponin i, poc: 0 ng/mL (ref 0.00–0.08)

## 2014-10-31 LAB — BRAIN NATRIURETIC PEPTIDE: B Natriuretic Peptide: 25.5 pg/mL (ref 0.0–100.0)

## 2014-10-31 MED ORDER — IBUPROFEN 800 MG PO TABS: 800.0000 mg | ORAL_TABLET | Freq: Three times a day (TID) | ORAL | Status: DC

## 2014-10-31 MED ORDER — KETOROLAC TROMETHAMINE 60 MG/2ML IM SOLN
60.0000 mg | Freq: Once | INTRAMUSCULAR | Status: AC
  Administered 2014-10-31: 60 mg via INTRAMUSCULAR
  Filled 2014-10-31: qty 2

## 2014-10-31 NOTE — ED Provider Notes (Signed)
CSN: [id number]     Arrival date & time 10/31/14  1018 History   First MD Initiated Contact with Patient 10/31/14 1241     Chief Complaint  Patient presents with  . Chest Pain     (Consider location/radiation/quality/duration/timing/severity/associated sxs/prior Treatment) HPI Comments: Patient with past medical history of pericarditis presents to the emergency department with chief complaint of chest pain. Patient states that he has had intermittent chest pain for several years. States that his symptoms recently worsened last night. He states the pain is in the center of his chest. It is not worsened with movement. He denies any associated shortness breath, nausea, or vomiting. Denies any aggravating or alleviating factors. Patient had a normal heart catheterization on 12/10/2011. He denies any associated fevers, chills, cough, or abdominal pain.  The history is provided by the patient. No language interpreter was used.    Past Medical History  Diagnosis Date  . Headache(784.0)   . Midsternal chest pain     a. Cardiac cath 12/10/11 with normal coronaries and LV systolic function;  07/7492 Echo EF 55-60%, no valvular abnormalities or pericardial effusion  . ST elevation     a. Anterior ST elevation ? early repolarization vs pericarditis  . GERD (gastroesophageal reflux disease)   . Abdominal pain     a. LLQ 11/2011  . Pericarditis     a. presumed - 11/2011   Past Surgical History  Procedure Laterality Date  . Cardiac catheterization  12/10/11    normal coronaries and LV systolic function;  Echo with normal LV function, EF 55-60%, no valvular abnormalities or pericardial effusion  . Left heart catheterization with coronary angiogram N/A 12/10/2011    Procedure: LEFT HEART CATHETERIZATION WITH CORONARY ANGIOGRAM;  Surgeon: Larey Dresser, MD;  Location: [facility] CATH LAB;  Service: Cardiovascular;  Laterality: N/A;   Family History  Problem Relation Age of Onset  . Diabetes Maternal Grandmother     History  Substance Use Topics  . Smoking status: Former Smoker -- 1.50 packs/day for 21 years    Types: Cigarettes    Quit date: 09/11/2002  . Smokeless tobacco: Not on file  . Alcohol Use: No    Review of Systems  Constitutional: Negative for fever and chills.  Respiratory: Negative for shortness of breath.   Cardiovascular: Positive for chest pain.  Gastrointestinal: Negative for nausea, vomiting, diarrhea and constipation.  Genitourinary: Negative for dysuria.  All other systems reviewed and are negative.     Allergies  Colchicine  Home Medications   Prior to Admission medications   Medication Sig Start Date End Date Taking? Authorizing Provider  Aspirin-Salicylamide-Caffeine (BC HEADACHE POWDER PO) Take 1 packet by mouth 2 (two) times daily as needed (pain).     Historical Provider, MD  Naphazoline HCl (CLEAR EYES OP) Place 1 drop into both eyes 3 (three) times daily as needed (redness, dryness).    Historical Provider, MD   BP 128/77 mmHg  Pulse 55  Temp(Src) 98.3 F (36.8 C) (Oral)  Resp 18  Ht 5\' 8"  (1.727 m)  Wt 175 lb (79.379 kg)  BMI 26.61 kg/m2  SpO2 99% Physical Exam  Constitutional: He is oriented to person, place, and time. He appears well-developed and well-nourished.  HENT:  Head: Normocephalic and atraumatic.  Eyes: Conjunctivae and EOM are normal. Pupils are equal, round, and reactive to light. Right eye exhibits no discharge. Left eye exhibits no discharge. No scleral icterus.  Neck: Normal range of motion. Neck supple. No JVD present.  Cardiovascular: Regular rhythm and normal heart sounds.  Exam reveals no gallop and no friction rub.   No murmur heard. Bradycardic  Pulmonary/Chest: Effort normal and breath sounds normal. No respiratory distress. He has no wheezes. He has no rales. He exhibits no tenderness.  Abdominal: Soft. He exhibits no distension and no mass. There is no tenderness. There is no rebound and no guarding.  Musculoskeletal:  Normal range of motion. He exhibits no edema or tenderness.  Neurological: He is alert and oriented to person, place, and time.  Skin: Skin is warm and dry.  Psychiatric: He has a normal mood and affect. His behavior is normal. Judgment and thought content normal.  Nursing note and vitals reviewed.   ED Course  Procedures (including critical care time) Results for orders placed or performed during the hospital encounter of 10/31/14  CBC  Result Value Ref Range   WBC 4.2 4.0 - 10.5 K/uL   RBC 4.24 4.22 - 5.81 MIL/uL   Hemoglobin 12.8 (L) 13.0 - 17.0 g/dL   HCT 39.2 39.0 - 52.0 %   MCV 92.5 78.0 - 100.0 fL   MCH 30.2 26.0 - 34.0 pg   MCHC 32.7 30.0 - 36.0 g/dL   RDW 11.9 11.5 - 15.5 %   Platelets 203 150 - 400 K/uL  Basic metabolic panel  Result Value Ref Range   Sodium 137 135 - 145 mmol/L   Potassium 3.4 (L) 3.5 - 5.1 mmol/L   Chloride 105 96 - 112 mmol/L   CO2 25 19 - 32 mmol/L   Glucose, Bld 90 70 - 99 mg/dL   BUN 9 6 - 23 mg/dL   Creatinine, Ser 0.96 0.50 - 1.35 mg/dL   Calcium 9.4 8.4 - 10.5 mg/dL   GFR calc non Af Amer >90 >90 mL/min   GFR calc Af Amer >90 >90 mL/min   Anion gap 7 5 - 15  BNP (order ONLY if patient complains of dyspnea/SOB AND you have documented it for THIS visit)  Result Value Ref Range   B Natriuretic Peptide 25.5 0.0 - 100.0 pg/mL  I-stat troponin, ED (not at [facility])  Result Value Ref Range   Troponin i, poc 0.00 0.00 - 0.08 ng/mL   Comment 3           Dg Chest 2 View  10/31/2014   CLINICAL DATA:  Central substernal chest pain, shortness of Breath  EXAM: CHEST  2 VIEW  COMPARISON:  04/12/2013  FINDINGS: The heart size and mediastinal contours are within normal limits. Both lungs are clear. The visualized skeletal structures are unremarkable.  IMPRESSION: No active cardiopulmonary disease.   Electronically Signed   By: Inez Catalina M.D.   On: 10/31/2014 12:06     Imaging Review Dg Chest 2 View  10/31/2014   CLINICAL DATA:  Central substernal chest  pain, shortness of Breath  EXAM: CHEST  2 VIEW  COMPARISON:  04/12/2013  FINDINGS: The heart size and mediastinal contours are within normal limits. Both lungs are clear. The visualized skeletal structures are unremarkable.  IMPRESSION: No active cardiopulmonary disease.   Electronically Signed   By: Inez Catalina M.D.   On: 10/31/2014 12:06     EKG Interpretation   Date/Time:  Monday October 31 2014 10:42:54 EDT Ventricular Rate:  54 PR Interval:  138 QRS Duration: 90 QT Interval:  402 QTC Calculation: 381 R Axis:   74 Text Interpretation:  Sinus bradycardia Left ventricular hypertrophy ST  elevation, consider early repolarization ,  noted on ECG dated 25 November 2011 Abnormal ECG Confirmed by KNAPP  MD-J, JON [phone]) on 10/31/2014  10:49:10 AM      MDM   Final diagnoses:  Pericarditis    Patient with central chest pain. Symptoms have been intermittent times several years. Pain acutely worsened last night. He has a history of pericarditis. Initial labs are reassuring. Troponin is negative. EKG shows similar ST elevation which is probably repolarization, but could be indicative of pericarditis.  Will give some toradol and reassess.  Patient discussed with Dr. Tomi Bamberger, who agrees with treatment plan.  Will DC with NSAIDs to treat presumed pericarditis.  Negative troponin.  Labs are reassuring.  Patient is stable and ready for discharge.    Montine Circle, PA-C 10/31/14 1423  Dorie Rank, MD 10/31/14 [phone]

## 2014-10-31 NOTE — ED Notes (Signed)
Pt reports chest pain onset this am, sts at 300 and he was unable to go to work, reports dry mouth and sob since onset. And weakness.

## 2014-10-31 NOTE — Discharge Instructions (Signed)
Pericarditis °Pericarditis is swelling (inflammation) of the pericardium. The pericardium is a thin, double-layered, fluid-filled tissue sac that surrounds the heart. The purpose of the pericardium is to contain the heart in the chest cavity and keep the heart from overexpanding. Different types of pericarditis can occur, such as: °· Acute pericarditis. Inflammation can develop suddenly in acute pericarditis. °· Chronic pericarditis. Inflammation develops gradually and is long-lasting in chronic pericarditis. °· Constrictive pericarditis. In this type of pericarditis, the layers of the pericardium stiffen and develop scar tissue. The scar tissue thickens and sticks together. This makes it difficult for the heart to pump and work as it normally does. °CAUSES  °Pericarditis can be caused from different conditions, such as: °· A bacterial, fungal or viral infection. °· After a heart attack (myocardial infarction). °· After open-heart surgery (coronary bypass graft surgery). °· Auto-immune conditions such as lupus, rheumatoid arthritis or scleroderma. °· Kidney failure. °· Low thyroid condition (hypothyroidism). °· Cancer from another part of the body that has spread (metastasized) to the pericardium. °· Chest injury or trauma. °· After radiation treatment. °· Certain medicines. °SYMPTOMS  °Symptoms of pericarditis can include: °· Chest pain. Chest pain symptoms may increase when laying down and may be relieved when sitting up and leaning forward. °· A chronic, dry cough. °· Heart palpitations. These may feel like rapid, fluttering or pounding heart beats. °· Chest pain may be worse when swallowing. °· Dizziness or fainting. °· Tiredness, fatigue or lethargy. °· Fever. °DIAGNOSIS  °Pericarditis is diagnosed by the following: °· A physical exam. A heart sound called a pericardial friction rub may be heard when your caregiver listens to your heart. °· Blood work. Blood may be drawn to check for an infection and to look at  your blood chemistry. °· Electrocardiography. During electrocardiography your heart's electrical activity is monitored and recorded with a tracing on paper (electrocardiogram [ECG]). °· Echocardiography. °· Computed tomography (CT). °· Magnetic resonance image (MRI). °TREATMENT  °To treat pericarditis, it is important to know the cause of it. The cause of pericarditis determines the treatment.  °· If the cause of pericarditis is due to an infection, treatment is based on the type of infection. If an infection is suspected in the pericardial fluid, a procedure called a pericardial fluid culture and biopsy may be done. This takes a sample of the pericardial fluid. The sample is sent to a lab which runs tests on the pericardial fluid to check for an infection. °· If the autoimmune disease is the cause, treatment of the autoimmune condition will help improve the pericarditis. °· If the cause of pericarditis is not known, anti-inflammatory medicines may be used to help decrease the inflammation. °· Surgery may be needed. The following are types of surgeries or procedures that may be done to treat pericarditis: °¨ Pericardial window. A pericardial window makes a cut (incision) into the pericardial sac. This allows excess fluid in the pericardium to drain. °¨ Pericardiocentesis. A pericardiocentesis is also known as a pericardial tap. This procedure uses a needle that is guided by X-ray to drain (aspirate) excess fluid from the pericardium. °¨ Pericardiectomy. A pericardiectomy removes part or all of the pericardium. °HOME CARE INSTRUCTIONS  °· Do not smoke. If you smoke, quit. Your caregiver can help you quit smoking. °· Maintain a healthy weight. °· Follow an exercise program as told by your caregiver. °· If you drink alcohol, do so in moderation. °· Eat a heart healthy diet. A registered dietician can help you learn about   healthy food choices. °· Keep a list of all your medicines with you at all times. Include the name,  dose, how often it is taken and how it is taken. °SEEK IMMEDIATE MEDICAL CARE IF:  °· You have chest pain or feelings of chest pressure. °· You have sweating (diaphoresis) when at rest. °· You have irregular heartbeats (palpitations). °· You have rapid, racing heart beats. °· You have unexplained fainting episodes. °· You feel sick to your stomach (nausea) or vomiting without cause. °· You have unexplained weakness. °If you develop any of the symptoms which originally made you seek care, call for local emergency medical help. Do not drive yourself to the hospital. °Document Released: 01/08/2001 Document Revised: 10/07/2011 Document Reviewed: 07/17/2011 °[employer]® Patient Information ©2015 [employer]. This information is not intended to replace advice given to you by your health care provider. Make sure you discuss any questions you have with your health care provider. ° °

## 2015-10-13 ENCOUNTER — Emergency Department (HOSPITAL_COMMUNITY)
Admission: EM | Admit: 2015-10-13 | Discharge: 2015-10-13 | Disposition: A | Discharge status: Home / Self Care | Payer: BLUE CROSS/BLUE SHIELD | Attending: Emergency Medicine | Admitting: Emergency Medicine

## 2015-10-13 ENCOUNTER — Encounter (HOSPITAL_COMMUNITY): Payer: Self-pay | Admitting: Emergency Medicine

## 2015-10-13 ENCOUNTER — Emergency Department (HOSPITAL_COMMUNITY): Payer: BLUE CROSS/BLUE SHIELD

## 2015-10-13 DIAGNOSIS — Z8679 Personal history of other diseases of the circulatory system: Secondary | ICD-10-CM | POA: Insufficient documentation

## 2015-10-13 DIAGNOSIS — Z8719 Personal history of other diseases of the digestive system: Secondary | ICD-10-CM | POA: Insufficient documentation

## 2015-10-13 DIAGNOSIS — R0789 Other chest pain: Secondary | ICD-10-CM | POA: Insufficient documentation

## 2015-10-13 DIAGNOSIS — Z9889 Other specified postprocedural states: Secondary | ICD-10-CM | POA: Insufficient documentation

## 2015-10-13 DIAGNOSIS — E860 Dehydration: Secondary | ICD-10-CM | POA: Diagnosis not present

## 2015-10-13 DIAGNOSIS — E871 Hypo-osmolality and hyponatremia: Secondary | ICD-10-CM

## 2015-10-13 DIAGNOSIS — R079 Chest pain, unspecified: Secondary | ICD-10-CM | POA: Diagnosis present

## 2015-10-13 DIAGNOSIS — E876 Hypokalemia: Secondary | ICD-10-CM | POA: Diagnosis not present

## 2015-10-13 DIAGNOSIS — Z87891 Personal history of nicotine dependence: Secondary | ICD-10-CM | POA: Insufficient documentation

## 2015-10-13 DIAGNOSIS — J029 Acute pharyngitis, unspecified: Secondary | ICD-10-CM | POA: Insufficient documentation

## 2015-10-13 LAB — URINE MICROSCOPIC-ADD ON: RBC / HPF: NONE SEEN RBC/hpf (ref 0–5)

## 2015-10-13 LAB — I-STAT TROPONIN, ED: Troponin i, poc: 0.01 ng/mL (ref 0.00–0.08)

## 2015-10-13 LAB — BASIC METABOLIC PANEL
ANION GAP: 13 (ref 5–15)
BUN: 9 mg/dL (ref 6–20)
CALCIUM: 9.2 mg/dL (ref 8.9–10.3)
CO2: 26 mmol/L (ref 22–32)
Chloride: 90 mmol/L — ABNORMAL LOW (ref 101–111)
Creatinine, Ser: 1.13 mg/dL (ref 0.61–1.24)
GFR calc non Af Amer: >60 mL/min (ref >60)
Glucose, Bld: 118 mg/dL — ABNORMAL HIGH (ref 65–99)
Potassium: 3 mmol/L — ABNORMAL LOW (ref 3.5–5.1)
Sodium: 129 mmol/L — ABNORMAL LOW (ref 135–145)

## 2015-10-13 LAB — URINALYSIS, ROUTINE W REFLEX MICROSCOPIC
GLUCOSE, UA: NEGATIVE mg/dL
Hgb urine dipstick: NEGATIVE
Ketones, ur: NEGATIVE mg/dL
Leukocytes, UA: NEGATIVE
NITRITE: NEGATIVE
Protein, ur: 100 mg/dL — AB
SPECIFIC GRAVITY, URINE: 1.026 (ref 1.005–1.030)
pH: 6 (ref 5.0–8.0)

## 2015-10-13 LAB — CBC
HEMATOCRIT: 40.3 % (ref 39.0–52.0)
Hemoglobin: 13.3 g/dL (ref 13.0–17.0)
MCH: 30.3 pg (ref 26.0–34.0)
MCHC: 33 g/dL (ref 30.0–36.0)
MCV: 91.8 fL (ref 78.0–100.0)
Platelets: 134 10*3/uL — ABNORMAL LOW (ref 150–400)
RBC: 4.39 MIL/uL (ref 4.22–5.81)
RDW: 11.6 % (ref 11.5–15.5)
WBC: 4 10*3/uL (ref 4.0–10.5)

## 2015-10-13 LAB — RAPID STREP SCREEN (MED CTR MEBANE ONLY): Streptococcus, Group A Screen (Direct): NEGATIVE

## 2015-10-13 MED ORDER — MORPHINE SULFATE (PF) 4 MG/ML IV SOLN
4.0000 mg | Freq: Once | INTRAVENOUS | Status: AC
  Administered 2015-10-13: 4 mg via INTRAVENOUS
  Filled 2015-10-13: qty 1

## 2015-10-13 MED ORDER — SODIUM CHLORIDE 0.9 % IV BOLUS (SEPSIS)
1000.0000 mL | Freq: Once | INTRAVENOUS | Status: AC
  Administered 2015-10-13: 1000 mL via INTRAVENOUS

## 2015-10-13 MED ORDER — KETOROLAC TROMETHAMINE 30 MG/ML IJ SOLN
30.0000 mg | Freq: Once | INTRAMUSCULAR | Status: AC
  Administered 2015-10-13: 30 mg via INTRAVENOUS
  Filled 2015-10-13: qty 1

## 2015-10-13 MED ORDER — HYDROCODONE-ACETAMINOPHEN 5-325 MG PO TABS: 1.0000 | ORAL_TABLET | ORAL | Status: DC | PRN

## 2015-10-13 MED ORDER — POTASSIUM CHLORIDE CRYS ER 20 MEQ PO TBCR
40.0000 meq | EXTENDED_RELEASE_TABLET | Freq: Once | ORAL | Status: AC
  Administered 2015-10-13: 40 meq via ORAL
  Filled 2015-10-13: qty 2

## 2015-10-13 MED ORDER — IBUPROFEN 800 MG PO TABS: 800.0000 mg | ORAL_TABLET | Freq: Three times a day (TID) | ORAL | Status: DC

## 2015-10-13 MED ORDER — ONDANSETRON HCL 4 MG/2ML IJ SOLN
4.0000 mg | Freq: Once | INTRAMUSCULAR | Status: AC
  Administered 2015-10-13: 4 mg via INTRAVENOUS
  Filled 2015-10-13: qty 2

## 2015-10-13 NOTE — Discharge Instructions (Signed)
Take motrin for pain.   Take vicodin for severe pain.   Keep yourself hydrated. Drink plenty of fluids.   Your sodium and potassium levels are low from not eating enough. Repeat levels in a week with your doctor.   See your doctor.   Return to ER if you have worse chest pain, trouble breathing, fever, vomiting, dehydration

## 2015-10-13 NOTE — ED Notes (Signed)
Patient states chest pain that started yesterday and progressively got worse today.   Patient denies nausea/vomiting/diarrhea.  Patient states he is not currently on medications for blood pressure.  Patient states his chest started hurting worse this morning, but denies other symptoms.  Patient states sore throat that started yesterday also.

## 2015-10-13 NOTE — ED Provider Notes (Signed)
CSN: [id number]     Arrival date & time 10/13/15  0732 History   First MD Initiated Contact with Patient 10/13/15 [phone]     Chief Complaint  Patient presents with  . Chest Pain  . Sore Throat     (Consider location/radiation/quality/duration/timing/severity/associated sxs/prior Treatment) The history is provided by the patient.  Chris Fox is a 45 y.o. male hx of pericarditis, here with chest pain, sore throat, fevers. Chest pain since yesterday. It is constant and worse with coughing and sitting up. Denies shortness of breath. Has subjective fever and sore throat as well. He states that he was admitted for pericarditis in 2013 and this was similar. During that admission, patient had unremarkable echo and cath and was sent home with vicodin (tried colchicine but he was allergic to it). He has sick contacts. Denies vomiting or diarrhea.    Past Medical History  Diagnosis Date  . Headache(784.0)   . Midsternal chest pain     a. Cardiac cath 12/10/11 with normal coronaries and LV systolic function;  [id number] Echo EF 55-60%, no valvular abnormalities or pericardial effusion  . ST elevation     a. Anterior ST elevation ? early repolarization vs pericarditis  . GERD (gastroesophageal reflux disease)   . Abdominal pain     a. LLQ 11/2011  . Pericarditis     a. presumed - 11/2011   Past Surgical History  Procedure Laterality Date  . Cardiac catheterization  12/10/11    normal coronaries and LV systolic function;  Echo with normal LV function, EF 55-60%, no valvular abnormalities or pericardial effusion  . Left heart catheterization with coronary angiogram N/A 12/10/2011    Procedure: LEFT HEART CATHETERIZATION WITH CORONARY ANGIOGRAM;  Surgeon: Larey Dresser, MD;  Location: [facility] CATH LAB;  Service: Cardiovascular;  Laterality: N/A;   Family History  Problem Relation Age of Onset  . Diabetes Maternal Grandmother    Social History  Substance Use Topics  . Smoking status: Former Smoker --  1.50 packs/day for 21 years    Types: Cigarettes    Quit date: 09/11/2002  . Smokeless tobacco: None  . Alcohol Use: No    Review of Systems  Constitutional: Positive for chills.  Cardiovascular: Positive for chest pain.  All other systems reviewed and are negative.     Allergies  Colchicine  Home Medications   Prior to Admission medications   Medication Sig Start Date End Date Taking? Authorizing Provider  ibuprofen (ADVIL,MOTRIN) 800 MG tablet Take 1 tablet (800 mg total) by mouth 3 (three) times daily. Patient not taking: Reported on 10/13/2015 10/31/14   Montine Circle, PA-C   BP 107/70 mmHg  Pulse 57  Temp(Src) 99.5 F (37.5 C) (Oral)  Resp 16  Ht 5\' 7"  (1.702 m)  Wt 179 lb (81.194 kg)  BMI 28.03 kg/m2  SpO2 99% Physical Exam  Constitutional: He is oriented to person, place, and time.  Dehydrated   HENT:  Head: Normocephalic.  MM slightly dry. OP slightly red. Uvula midline   Eyes: Conjunctivae are normal. Pupils are equal, round, and reactive to light.  Neck: Normal range of motion. Neck supple.  Cardiovascular: Normal rate, regular rhythm and normal heart sounds.   No obvious heart murmur   Pulmonary/Chest: Effort normal and breath sounds normal. No respiratory distress. He has no wheezes. He has no rales.  Abdominal: Soft. Bowel sounds are normal. He exhibits no distension. There is no tenderness. There is no rebound.  Musculoskeletal: Normal range of  motion. He exhibits no edema or tenderness.  Neurological: He is alert and oriented to person, place, and time. No cranial nerve deficit. Coordination normal.  Skin: Skin is warm and dry.  Psychiatric: He has a normal mood and affect. His behavior is normal. Judgment and thought content normal.  Nursing note and vitals reviewed.   ED Course  Procedures (including critical care time)    EMERGENCY DEPARTMENT [place] CARDIAC EXAM "Study: Limited Ultrasound of the heart and  pericardium"  INDICATIONS:Dyspnea Multiple views of the heart and pericardium are obtained with a multi-frequency probe.  PERFORMED YE:[id number]  IMAGES ARCHIVED?: Yes  FINDINGS: No pericardial effusion  LIMITATIONS:  Body habitus  VIEWS USED: Subcostal 4 chamber, Parasternal long axis and Parasternal short axis  INTERPRETATION: Cardiac activity present  COMMENT:  No pericardial effusion    Labs Review Labs Reviewed  CBC - Abnormal; Notable for the following:    Platelets 134 (*)    All other components within normal limits  BASIC METABOLIC PANEL - Abnormal; Notable for the following:    Sodium 129 (*)    Potassium 3.0 (*)    Chloride 90 (*)    Glucose, Bld 118 (*)    All other components within normal limits  URINALYSIS, ROUTINE W REFLEX MICROSCOPIC (NOT AT [facility]) - Abnormal; Notable for the following:    Color, Urine AMBER (*)    Bilirubin Urine SMALL (*)    Protein, ur 100 (*)    All other components within normal limits  URINE MICROSCOPIC-ADD ON - Abnormal; Notable for the following:    Squamous Epithelial / LPF 0-5 (*)    Bacteria, UA RARE (*)    All other components within normal limits  RAPID STREP SCREEN (NOT AT [facility])  CULTURE, GROUP A STREP ([place])  I-STAT TROPOININ, ED    Imaging Review Dg Chest 2 View  10/13/2015  CLINICAL DATA:  Fever, cough. EXAM: CHEST  2 VIEW COMPARISON:  October 31, 2014. FINDINGS: The heart size and mediastinal contours are within normal limits. Both lungs are clear. No pneumothorax or pleural effusion is noted. The visualized skeletal structures are unremarkable. IMPRESSION: No active cardiopulmonary disease. Electronically Signed   By: Marijo Conception, M.D.   On: 10/13/2015 08:41   I have personally reviewed and evaluated these images and lab results as part of my medical decision-making.   EKG Interpretation   Date/Time:  Friday October 13 2015 07:43:59 EDT Ventricular Rate:  88 PR Interval:  124 QRS Duration: 98 QT Interval:   332 QTC Calculation: 401 R Axis:   81 Text Interpretation:  Normal sinus rhythm Right atrial enlargement Left  ventricular hypertrophy Nonspecific T wave abnormality Abnormal ECG No  significant change since last tracing Confirmed by Collan Schoenfeld  MD, Ashford Clouse ([id number])  on 10/13/2015 12:07:31 PM      MDM   Final diagnoses:  None    Chris Fox is a 45 y.o. male here with chest pain, chills, sore throat. Likely pericarditis vs viral syndrome. EKG unchanged. Bedside Echo showed no effusion and had cath several years ago that showed no CAD. Bedside echo showed no pericardial effusion. Will check labs, trop x 1 (symptoms since yesterday), CXR. Will hydrate and reassess.   12:18 PM Trop neg x 1. Na 129 and K 3.0 likely from dehydration and poor PO intake. No vomiting in the ED, K supplemented. CXR unremarkable. He didn't require steroids for pericarditis last time and is allergic to colchicine and was sent home with vicodin  and motrin. His chest pain improved. Likely viral syndrome vs pericarditis. Recommend motrin, prn vicodin. Recommend repeat chemistry in a week with PCP.    Wandra Arthurs, MD 10/13/15 [phone]

## 2015-10-13 NOTE — ED Notes (Signed)
Patient already has urinal and was reminded of the need of an urine specimen; visitor at bedside

## 2015-10-15 LAB — CULTURE, GROUP A STREP (THRC)

## 2016-02-21 ENCOUNTER — Encounter (HOSPITAL_COMMUNITY): Payer: Self-pay | Admitting: Emergency Medicine

## 2016-02-21 ENCOUNTER — Emergency Department (HOSPITAL_COMMUNITY)
Admission: EM | Admit: 2016-02-21 | Discharge: 2016-02-21 | Disposition: A | Discharge status: Home / Self Care | Payer: BLUE CROSS/BLUE SHIELD | Attending: Emergency Medicine | Admitting: Emergency Medicine

## 2016-02-21 DIAGNOSIS — W228XXA Striking against or struck by other objects, initial encounter: Secondary | ICD-10-CM | POA: Diagnosis not present

## 2016-02-21 DIAGNOSIS — Z87891 Personal history of nicotine dependence: Secondary | ICD-10-CM | POA: Insufficient documentation

## 2016-02-21 DIAGNOSIS — Y929 Unspecified place or not applicable: Secondary | ICD-10-CM | POA: Insufficient documentation

## 2016-02-21 DIAGNOSIS — Y999 Unspecified external cause status: Secondary | ICD-10-CM | POA: Diagnosis not present

## 2016-02-21 DIAGNOSIS — S81841A Puncture wound with foreign body, right lower leg, initial encounter: Secondary | ICD-10-CM | POA: Diagnosis not present

## 2016-02-21 DIAGNOSIS — M795 Residual foreign body in soft tissue: Secondary | ICD-10-CM

## 2016-02-21 DIAGNOSIS — Y939 Activity, unspecified: Secondary | ICD-10-CM | POA: Diagnosis not present

## 2016-02-21 DIAGNOSIS — S80851A Superficial foreign body, right lower leg, initial encounter: Secondary | ICD-10-CM | POA: Diagnosis not present

## 2016-02-21 HISTORY — DX: Gout, unspecified: M10.9

## 2016-02-21 MED ORDER — IBUPROFEN 800 MG PO TABS: 800.0000 mg | ORAL_TABLET | Freq: Three times a day (TID) | ORAL | 0 refills | Status: DC | PRN

## 2016-02-21 MED ORDER — CEPHALEXIN 500 MG PO CAPS: 1000.0000 mg | ORAL_CAPSULE | Freq: Two times a day (BID) | ORAL | 0 refills | Status: DC

## 2016-02-21 MED ORDER — LIDOCAINE HCL 2 % IJ SOLN
20.0000 mL | Freq: Once | INTRAMUSCULAR | Status: AC
  Administered 2016-02-21: 400 mg via INTRADERMAL
  Filled 2016-02-21: qty 20

## 2016-02-21 NOTE — ED Notes (Signed)
Pt has an area that is about 5 cm in length, it is an abrasion. There is a small swollen area less than 0.5 cm X 0.5 cm. Area is tender to the touch. No bleeding or drainage noted.

## 2016-02-21 NOTE — Discharge Instructions (Signed)
Return here as needed.  Keep the area clean and dry.  I would cover the area if you are going to be working or in any environment that it could get dirty

## 2016-02-21 NOTE — ED Notes (Signed)
Pt called for room placement with no answer. 

## 2016-02-21 NOTE — ED Triage Notes (Signed)
Pt. accidentally hit his right shin against a wooden palette this morning reports splinter beneath skin at right shin with no bleeding .

## 2016-02-21 NOTE — ED Provider Notes (Addendum)
[place] DEPT Provider Note   CSN: [id number] Arrival date & time: 02/21/16  0355  First Provider Contact:  0600 a.m       History   Chief Complaint Chief Complaint  Patient presents with  . Foreign Body in Skin     HPI Patient presents to the emergency department with a foreign body in his right lower leg.  The patient states that he was moving a pallet when a piece of wood stuck into his right lower anterior leg.  Patient states that there was no piece of the wood sticking out of his skin.  He did not attempt to remove it.  Patient states that movement and palpation make the pain worse.  Patient states he has not had any numbness or weakness below the area of the injury Past Medical History:  Diagnosis Date  . Abdominal pain    a. LLQ 11/2011  . GERD (gastroesophageal reflux disease)   . Gout   . Headache(784.0)   . Midsternal chest pain    a. Cardiac cath 12/10/11 with normal coronaries and LV systolic function;  [id number] Echo EF 55-60%, no valvular abnormalities or pericardial effusion  . Pericarditis    a. presumed - 11/2011  . ST elevation    a. Anterior ST elevation ? early repolarization vs pericarditis    Patient Active Problem List   Diagnosis Date Noted  . Midsternal chest pain   . ST elevation   . Abdominal pain   . Chest pain 12/10/2011  . Bradycardia 12/10/2011    Past Surgical History:  Procedure Laterality Date  . CARDIAC CATHETERIZATION  12/10/11   normal coronaries and LV systolic function;  Echo with normal LV function, EF 55-60%, no valvular abnormalities or pericardial effusion  . LEFT HEART CATHETERIZATION WITH CORONARY ANGIOGRAM N/A 12/10/2011   Procedure: LEFT HEART CATHETERIZATION WITH CORONARY ANGIOGRAM;  Surgeon: Larey Dresser, MD;  Location[facility] CATH LAB;  Service: Cardiovascular;  Laterality: N/A;       Home Medications    Prior to Admission medications   Not on File    Family History Family History  Problem Relation Age of  Onset  . Diabetes Maternal Grandmother     Social History Social History  Substance Use Topics  . Smoking status: Former Smoker    Packs/day: 1.50    Years: 21.00    Types: Cigarettes    Quit date: 09/11/2002  . Smokeless tobacco: Not on file  . Alcohol use No     Allergies   Colchicine   Review of Systems Review of Systems All other systems negative except as documented in the HPI. All pertinent positives and negatives as reviewed in the HPI.  Physical Exam Updated Vital Signs BP 99/74 (BP Location: Left Arm)   Pulse 71   Temp 98.2 F (36.8 C) (Oral)   Resp 16   SpO2 99%   Physical Exam  Constitutional: He appears well-developed and well-nourished. No distress.  HENT:  Head: Normocephalic and atraumatic.  Eyes: Pupils are equal, round, and reactive to light.  Musculoskeletal:       Legs:    ED Treatments / Results  Labs (all labs ordered are listed, but only abnormal results are displayed) Labs Reviewed - No data to display  EKG  EKG Interpretation None       Radiology No results found.  Procedures .Foreign Body Removal Date/Time: 02/21/2016 9:16 AM Performed by: Dalia Heading Authorized by: Dalia Heading  Consent: Verbal consent obtained. Written  consent not obtained. Risks and benefits: risks, benefits and alternatives were discussed Consent given by: patient Patient understanding: patient states understanding of the procedure being performed Patient consent: the patient's understanding of the procedure matches consent given Procedure consent: procedure consent matches procedure scheduled Relevant documents: relevant documents present and verified Body area: skin General location: lower extremity Location details: right lower leg Anesthesia: local infiltration  Anesthesia: Local Anesthetic: lidocaine 2% without epinephrine Anesthetic total: 6 mL  Sedation: Patient sedated: no Patient restrained: no Patient cooperative:  yes Localization method: ultrasound and probed Removal mechanism: forceps and hemostat Dressing: antibiotic ointment and dressing applied Tendon involvement: none Depth: subcutaneous Complexity: complex 1 objects recovered. Objects recovered: 1 Post-procedure assessment: foreign body removed Patient tolerance: Patient tolerated the procedure well with no immediate complications Comments: The wound was copiously irrigated after the foreign body was removed   (including critical care time)  Medications Ordered in ED Medications  lidocaine (XYLOCAINE) 2 % (with pres) injection 400 mg (400 mg Intradermal Given by Other 02/21/16 [id number])     Initial Impression / Assessment and Plan / ED Course  I have reviewed the triage vital signs and the nursing notes.  Pertinent labs & imaging results that were available during my care of the patient were reviewed by me and considered in my medical decision making (see chart for details).  Clinical Course    Patient be placed on antibiotics.  Told to return here as needed.  Patient is advised the plan and all questions were answered.  I advised him to follow-up with his primary care doctor.  Return here for any signs of infection  Final Clinical Impressions(s) / ED Diagnoses   Final diagnoses:  None    New Prescriptions New Prescriptions   No medications on file     Dalia Heading, PA-C 02/21/16 [place] Yao, MD 02/21/16 [place], PA-C 02/23/16 [place] Yao, MD 02/23/16 2052

## 2016-06-13 ENCOUNTER — Emergency Department (HOSPITAL_COMMUNITY)
Admission: EM | Admit: 2016-06-13 | Discharge: 2016-06-13 | Disposition: A | Discharge status: Home / Self Care | Payer: BLUE CROSS/BLUE SHIELD | Attending: Emergency Medicine | Admitting: Emergency Medicine

## 2016-06-13 ENCOUNTER — Encounter (HOSPITAL_COMMUNITY): Payer: Self-pay | Admitting: Emergency Medicine

## 2016-06-13 DIAGNOSIS — M170 Bilateral primary osteoarthritis of knee: Secondary | ICD-10-CM | POA: Diagnosis not present

## 2016-06-13 DIAGNOSIS — I252 Old myocardial infarction: Secondary | ICD-10-CM | POA: Diagnosis not present

## 2016-06-13 DIAGNOSIS — Z87891 Personal history of nicotine dependence: Secondary | ICD-10-CM | POA: Diagnosis not present

## 2016-06-13 DIAGNOSIS — M25562 Pain in left knee: Secondary | ICD-10-CM | POA: Diagnosis present

## 2016-06-13 MED ORDER — ACETAMINOPHEN 500 MG PO TABS
1000.0000 mg | ORAL_TABLET | Freq: Once | ORAL | Status: AC
  Administered 2016-06-13: 1000 mg via ORAL
  Filled 2016-06-13: qty 2

## 2016-06-13 MED ORDER — ACETAMINOPHEN 500 MG PO TABS: 1000.0000 mg | ORAL_TABLET | Freq: Four times a day (QID) | ORAL | 0 refills | Status: DC | PRN

## 2016-06-13 MED ORDER — NAPROXEN 250 MG PO TABS
500.0000 mg | ORAL_TABLET | Freq: Once | ORAL | Status: AC
  Administered 2016-06-13: 500 mg via ORAL
  Filled 2016-06-13: qty 2

## 2016-06-13 MED ORDER — NAPROXEN 500 MG PO TABS: 500.0000 mg | ORAL_TABLET | Freq: Two times a day (BID) | ORAL | 0 refills | Status: AC

## 2016-06-13 NOTE — ED Provider Notes (Signed)
[place] DEPT Provider Note   CSN: [id number] Arrival date & time: 06/13/16  1152     History   Chief Complaint Chief Complaint  Patient presents with  . Knee Pain  . Joint Swelling    HPI Chris Fox is a 45 y.o. male.  The history is provided by the patient.  Knee Pain   This is a new problem. Episode onset: 2 weeks. The problem occurs constantly. The problem has been gradually worsening. The pain is present in the right knee and left knee. The quality of the pain is described as aching. The pain is moderate. Associated symptoms include stiffness. The symptoms are aggravated by standing and activity. He has tried nothing for the symptoms. There has been no history of extremity trauma.    Past Medical History:  Diagnosis Date  . Abdominal pain    a. LLQ 11/2011  . GERD (gastroesophageal reflux disease)   . Gout   . Headache(784.0)   . Midsternal chest pain    a. Cardiac cath 12/10/11 with normal coronaries and LV systolic function;  [id number] Echo EF 55-60%, no valvular abnormalities or pericardial effusion  . Pericarditis    a. presumed - 11/2011  . ST elevation    a. Anterior ST elevation ? early repolarization vs pericarditis    Patient Active Problem List   Diagnosis Date Noted  . Midsternal chest pain   . ST elevation   . Abdominal pain   . Chest pain 12/10/2011  . Bradycardia 12/10/2011    Past Surgical History:  Procedure Laterality Date  . CARDIAC CATHETERIZATION  12/10/11   normal coronaries and LV systolic function;  Echo with normal LV function, EF 55-60%, no valvular abnormalities or pericardial effusion  . LEFT HEART CATHETERIZATION WITH CORONARY ANGIOGRAM N/A 12/10/2011   Procedure: LEFT HEART CATHETERIZATION WITH CORONARY ANGIOGRAM;  Surgeon: Larey Dresser, MD;  Location: [facility] CATH LAB;  Service: Cardiovascular;  Laterality: N/A;       Home Medications    Prior to Admission medications   Medication Sig Start Date End Date Taking?  Authorizing Provider  cephALEXin (KEFLEX) 500 MG capsule Take 2 capsules (1,000 mg total) by mouth 2 (two) times daily. 02/21/16   Dalia Heading, PA-C  ibuprofen (ADVIL,MOTRIN) 800 MG tablet Take 1 tablet (800 mg total) by mouth every 8 (eight) hours as needed. 02/21/16   Dalia Heading, PA-C    Family History Family History  Problem Relation Age of Onset  . Diabetes Maternal Grandmother     Social History Social History  Substance Use Topics  . Smoking status: Former Smoker    Packs/day: 1.50    Years: 21.00    Types: Cigarettes    Quit date: 09/11/2002  . Smokeless tobacco: Not on file  . Alcohol use No     Allergies   Colchicine   Review of Systems Review of Systems  Musculoskeletal: Positive for stiffness.  All other systems reviewed and are negative.    Physical Exam Updated Vital Signs BP 117/60   Pulse 80   Temp 98.3 F (36.8 C)   SpO2 100%   Physical Exam  Constitutional: He is oriented to person, place, and time. He appears well-developed and well-nourished. No distress.  HENT:  Head: Normocephalic and atraumatic.  Nose: Nose normal.  Eyes: Conjunctivae are normal.  Neck: Neck supple. No tracheal deviation present.  Cardiovascular: Normal rate and regular rhythm.   Pulmonary/Chest: Effort normal. No respiratory distress.  Abdominal: Soft. He exhibits no  distension.  Musculoskeletal:  Bilateral knee effusions L>R with no erythema, warmth, or decreased ROM. Crepitus evident bilaterally  Neurological: He is alert and oriented to person, place, and time.  Skin: Skin is warm and dry.  Psychiatric: He has a normal mood and affect.     ED Treatments / Results  Labs (all labs ordered are listed, but only abnormal results are displayed) Labs Reviewed - No data to display  EKG  EKG Interpretation None       Radiology No results found.  Procedures Procedures (including critical care time)  Medications Ordered in ED Medications    naproxen (NAPROSYN) tablet 500 mg (500 mg Oral Given 06/13/16 1251)  acetaminophen (TYLENOL) tablet 1,000 mg (1,000 mg Oral Given 06/13/16 1251)     Initial Impression / Assessment and Plan / ED Course  I have reviewed the triage vital signs and the nursing notes.  Pertinent labs & imaging results that were available during my care of the patient were reviewed by me and considered in my medical decision making (see chart for details).  Clinical Course    45 y.o. male presents with Bilateral knee swelling asymmetrically with left greater than right. He has crepitus on both knees and stiffness with pain that appears to worsen throughout the day that is consistent with bilateral osteoarthritis. 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 He has not tried any medical therapy up to this point. Patient will be provided naproxen and Tylenol scheduled for pain and inflammation with planned follow-up on outpatient basis to consider intra-articular injection as an adjunct therapy. Provided follow-up information with on-call orthopedics as needed.  Final Clinical Impressions(s) / ED Diagnoses   Final diagnoses:  Osteoarthritis of both knees, unspecified osteoarthritis type    New Prescriptions Discharge Medication List as of 06/13/2016 12:56 PM    START taking these medications   Details  acetaminophen (TYLENOL) 500 MG tablet Take 2 tablets (1,000 mg total) by mouth every 6 (six) hours as needed for mild pain., Starting Thu 06/13/2016, Print    naproxen (NAPROSYN) 500 MG tablet Take 1 tablet (500 mg total) by mouth 2 (two) times daily with a meal., Starting Thu 06/13/2016, Until Sun 06/23/2016, Print         Leo Grosser, MD 06/13/16 2248

## 2016-06-13 NOTE — ED Triage Notes (Signed)
Pt states "i ve got problems with my knees my Lef tone feels swollen and it feels like theres fluid in it and my right one is painful." pt denies injuries. Pt endorses symptoms for a couple of weeks. Pt states a few weeks ago his feet also got swollen.

## 2016-08-19 ENCOUNTER — Encounter (HOSPITAL_COMMUNITY): Payer: Self-pay | Admitting: *Deleted

## 2016-08-19 ENCOUNTER — Emergency Department (HOSPITAL_COMMUNITY)
Admission: EM | Admit: 2016-08-19 | Discharge: 2016-08-19 | Disposition: A | Discharge status: Home / Self Care | Payer: BLUE CROSS/BLUE SHIELD | Attending: Emergency Medicine | Admitting: Emergency Medicine

## 2016-08-19 DIAGNOSIS — S39012A Strain of muscle, fascia and tendon of lower back, initial encounter: Secondary | ICD-10-CM

## 2016-08-19 DIAGNOSIS — X58XXXA Exposure to other specified factors, initial encounter: Secondary | ICD-10-CM | POA: Diagnosis not present

## 2016-08-19 DIAGNOSIS — Z87891 Personal history of nicotine dependence: Secondary | ICD-10-CM | POA: Insufficient documentation

## 2016-08-19 DIAGNOSIS — Y999 Unspecified external cause status: Secondary | ICD-10-CM | POA: Diagnosis not present

## 2016-08-19 DIAGNOSIS — Y929 Unspecified place or not applicable: Secondary | ICD-10-CM | POA: Diagnosis not present

## 2016-08-19 DIAGNOSIS — Y939 Activity, unspecified: Secondary | ICD-10-CM | POA: Diagnosis not present

## 2016-08-19 DIAGNOSIS — Z79899 Other long term (current) drug therapy: Secondary | ICD-10-CM | POA: Insufficient documentation

## 2016-08-19 DIAGNOSIS — S3992XA Unspecified injury of lower back, initial encounter: Secondary | ICD-10-CM | POA: Diagnosis present

## 2016-08-19 MED ORDER — METHOCARBAMOL 500 MG PO TABS: 500.0000 mg | ORAL_TABLET | Freq: Two times a day (BID) | ORAL | 0 refills | Status: DC | PRN

## 2016-08-19 MED ORDER — KETOROLAC TROMETHAMINE 60 MG/2ML IM SOLN
30.0000 mg | Freq: Once | INTRAMUSCULAR | Status: AC
  Administered 2016-08-19: 30 mg via INTRAMUSCULAR
  Filled 2016-08-19: qty 2

## 2016-08-19 MED ORDER — LIDOCAINE 5 % EX PTCH
1.0000 | MEDICATED_PATCH | Freq: Once | CUTANEOUS | Status: DC
  Administered 2016-08-19: 1 via TRANSDERMAL
  Filled 2016-08-19: qty 1

## 2016-08-19 MED ORDER — HYDROCODONE-ACETAMINOPHEN 5-325 MG PO TABS: 1.0000 | ORAL_TABLET | Freq: Four times a day (QID) | ORAL | 0 refills | Status: DC | PRN

## 2016-08-19 NOTE — ED Notes (Signed)
Pt ambulatory at DC, offered wheelchair, pt declined. NAD. VSS. Pt verbalized understanding of DC Teaching and medications.

## 2016-08-19 NOTE — ED Triage Notes (Signed)
Pt c/o L side lower back pain onset yesterday while sitting, pt denies injury to the area, pt denies urinary symptoms, pt ambulatory, MAE, pt A&O x4, denies bowel & bladder incontinence

## 2016-08-19 NOTE — Discharge Instructions (Signed)
Robaxin as her muscle relaxer to take as needed. Take pain medication only as needed for severe pain: This can make you very drowsy - please do not drink alcohol, operate heavy machinery or drive on this medication. Please continue your home over-the-counter pain reliever for mild to moderate pain.   Back Pain:  Your back pain should be treated with medicines such as ibuprofen or aleve and this back pain should get better over the next 2 weeks.  However if you develop severe or worsening pain, low back pain with fever, numbness, weakness or inability to walk or urinate, you should return to the ER immediately.  Please follow up with your doctor this week for a recheck if still having symptoms.  Low back pain is discomfort in the lower back that may be due to injuries to muscles and ligaments around the spine. Occasionally, it may be caused by a a problem to a part of the spine called a disc. The pain may last several days or a week;  However, most patients get completely well in 4 weeks.  COLD THERAPY DIRECTIONS:  Ice or gel packs can be used to reduce both pain and swelling. Ice is the most helpful within the first 24 to 48 hours after an injury or flareup from overusing a muscle or joint.  Ice is effective, has very few side effects, and is safe for most people to use.   If you expose your skin to cold temperatures for too long or without the proper protection, you can damage your skin or nerves. Watch for signs of skin damage due to cold.   HOME CARE INSTRUCTIONS  Follow these tips to use ice and cold packs safely.  Place a dry or damp towel between the ice and skin. A damp towel will cool the skin more quickly, so you may need to shorten the time that the ice is used.  For a more rapid response, add gentle compression to the ice.  Ice for no more than 10 to 20 minutes at a time. The bonier the area you are icing, the less time it will take to get the benefits of ice.  Check your skin after 5  minutes to make sure there are no signs of a poor response to cold or skin damage.  Rest 20 minutes or more in between uses.  Once your skin is numb, you can end your treatment. You can test numbness by very lightly touching your skin. The touch should be so light that you do not see the skin dimple from the pressure of your fingertip. When using ice, most people will feel these normal sensations in this order: cold, burning, aching, and numbness.   You will need to follow up with your primary healthcare provider in 1-2 weeks for reassessment.  Be aware that if you develop new symptoms, such as a fever, leg weakness, difficulty with or loss of control of your urine or bowels, abdominal pain, or more severe pain, you will need to seek medical attention and  / or return to the Emergency department.

## 2016-08-19 NOTE — ED Provider Notes (Signed)
[place] DEPT Provider Note   CSN: [id number] Arrival date & time: 08/19/16  [id number]  By signing my name below, I, Sonum Patel, attest that this documentation has been prepared under the direction and in the presence of [facility], PA-C. Electronically Signed: Sonum Patel, Education administrator. 08/19/16. 10:07 AM.  History   Chief Complaint Chief Complaint  Patient presents with  . Back Pain    The history is provided by the patient. No language interpreter was used.     HPI Comments: Chris Fox is a 46 y.o. male who presents to the Emergency Department complaining of 1 day of constant, unchanged left lower back pain with radiation down the posterior left leg. He has associated paresthesia to the posterior left thigh. Radiation/paresthesia do no extend past the knee. He denies known injury or trauma to the affected area. He states he was driving when the pain began. He denies heavy lifting at work but states he has to do repetitive bending at the torso. He denies fever, chills, abdominal pain, nausea, vomiting, numbness, weakness, Saddle anesthesia. No history of IVDU or cancer.   Past Medical History:  Diagnosis Date  . Abdominal pain    a. LLQ 11/2011  . GERD (gastroesophageal reflux disease)   . Gout   . Headache(784.0)   . Midsternal chest pain    a. Cardiac cath 12/10/11 with normal coronaries and LV systolic function;  [id number] Echo EF 55-60%, no valvular abnormalities or pericardial effusion  . Pericarditis    a. presumed - 11/2011  . ST elevation    a. Anterior ST elevation ? early repolarization vs pericarditis    Patient Active Problem List   Diagnosis Date Noted  . Midsternal chest pain   . ST elevation   . Abdominal pain   . Chest pain 12/10/2011  . Bradycardia 12/10/2011    Past Surgical History:  Procedure Laterality Date  . CARDIAC CATHETERIZATION  12/10/11   normal coronaries and LV systolic function;  Echo with normal LV function, EF 55-60%, no valvular  abnormalities or pericardial effusion  . LEFT HEART CATHETERIZATION WITH CORONARY ANGIOGRAM N/A 12/10/2011   Procedure: LEFT HEART CATHETERIZATION WITH CORONARY ANGIOGRAM;  Surgeon: Larey Dresser, MD;  Location: [facility] CATH LAB;  Service: Cardiovascular;  Laterality: N/A;       Home Medications    Prior to Admission medications   Medication Sig Start Date End Date Taking? Authorizing Provider  acetaminophen (TYLENOL) 500 MG tablet Take 2 tablets (1,000 mg total) by mouth every 6 (six) hours as needed for mild pain. 06/13/16   Leo Grosser, MD  cephALEXin (KEFLEX) 500 MG capsule Take 2 capsules (1,000 mg total) by mouth 2 (two) times daily. 02/21/16   Dalia Heading, PA-C  HYDROcodone-acetaminophen (NORCO) 5-325 MG tablet Take 1 tablet by mouth every 6 (six) hours as needed for moderate pain. 08/19/16   Ozella Almond Abrar Koone, PA-C  ibuprofen (ADVIL,MOTRIN) 800 MG tablet Take 1 tablet (800 mg total) by mouth every 8 (eight) hours as needed. 02/21/16   Dalia Heading, PA-C  methocarbamol (ROBAXIN) 500 MG tablet Take 1 tablet (500 mg total) by mouth 2 (two) times daily as needed for muscle spasms. 08/19/16   [place], PA-C    Family History Family History  Problem Relation Age of Onset  . Diabetes Maternal Grandmother     Social History Social History  Substance Use Topics  . Smoking status: Former Smoker    Packs/day: 1.50    Years: 21.00  Types: Cigarettes    Quit date: 09/11/2002  . Smokeless tobacco: Never Used  . Alcohol use No     Allergies   Colchicine   Review of Systems Review of Systems  Constitutional: Negative for chills and fever.  Gastrointestinal: Negative for abdominal pain, nausea and vomiting.  Musculoskeletal: Positive for back pain.  Neurological: Negative for weakness and numbness.     Physical Exam Updated Vital Signs BP 118/77 (BP Location: Right Arm)   Pulse (!) 57   Temp 98 F (36.7 C) (Oral)   Resp 20   Ht 5\' 8"  (1.727 m)   Wt  81.6 kg   SpO2 100%   BMI 27.37 kg/m   Physical Exam  Constitutional: He is oriented to person, place, and time. He appears well-developed and well-nourished. No distress.  HENT:  Head: Normocephalic and atraumatic.  Cardiovascular: Normal rate, regular rhythm and normal heart sounds.   No murmur heard. Pulmonary/Chest: Effort normal and breath sounds normal. No respiratory distress.  Abdominal: Soft. He exhibits no distension. There is no tenderness.  Musculoskeletal: He exhibits tenderness. He exhibits no edema.  Tenderness to palpation along left lumbar paraspinal musculature without overlying skin changes. No midline C, T, L spine tenderness. SLR on left with significant pain but minimal radicular symptoms.   Neurological: He is alert and oriented to person, place, and time. No sensory deficit.  Lower extremities NVI  Skin: Skin is warm and dry.  Nursing note and vitals reviewed.    ED Treatments / Results  DIAGNOSTIC STUDIES: Oxygen Saturation is 100% on RA, normal by my interpretation.    COORDINATION OF CARE: 10:12 AM Discussed treatment plan with pt at bedside and pt agreed to plan.   Labs (all labs ordered are listed, but only abnormal results are displayed) Labs Reviewed - No data to display  EKG  EKG Interpretation None       Radiology No results found.  Procedures Procedures (including critical care time)  Medications Ordered in ED Medications  lidocaine (LIDODERM) 5 % 1 patch (not administered)  ketorolac (TORADOL) injection 30 mg (30 mg Intramuscular Given 08/19/16 1023)     Initial Impression / Assessment and Plan / ED Course  I have reviewed the triage vital signs and the nursing notes.  Pertinent labs & imaging results that were available during my care of the patient were reviewed by me and considered in my medical decision making (see chart for details).      Tobey Bride presents with back pain. Patient demonstrates no lower extremity  weakness, saddle anesthesia, bowel or bladder incontinence, or neuro deficits. No concern for cauda equina. No fevers or other infectious symptoms to suggest that the patient's back pain is due to an infection. Lower extremities are neurovascularly intact and patient is ambulatory in ED. I have reviewed return precautions, including the development of any of these signs or symptoms, and the patient has voiced understanding. I reviewed supportive care instructions, including NSAIDs, early range of motion exercises. PCP follow-up if symptoms do not improve. RX for Robaxin and short course of pain meds. Continue OTC NSAID. Patient voiced understanding and agreement with plan.    Final Clinical Impressions(s) / ED Diagnoses   Final diagnoses:  Strain of lumbar region, initial encounter    New Prescriptions New Prescriptions   HYDROCODONE-ACETAMINOPHEN (NORCO) 5-325 MG TABLET    Take 1 tablet by mouth every 6 (six) hours as needed for moderate pain.   METHOCARBAMOL (ROBAXIN) 500 MG TABLET  Take 1 tablet (500 mg total) by mouth 2 (two) times daily as needed for muscle spasms.   I personally performed the services described in this documentation, which was scribed in my presence. The recorded information has been reviewed and is accurate.    [facility] Derald Lorge, PA-C 08/19/16 [place], MD 08/23/16 2216

## 2016-08-21 ENCOUNTER — Encounter (HOSPITAL_COMMUNITY): Payer: Self-pay | Admitting: Emergency Medicine

## 2016-08-21 ENCOUNTER — Emergency Department (HOSPITAL_COMMUNITY)
Admission: EM | Admit: 2016-08-21 | Discharge: 2016-08-21 | Disposition: A | Discharge status: Home / Self Care | Payer: BLUE CROSS/BLUE SHIELD | Attending: Emergency Medicine | Admitting: Emergency Medicine

## 2016-08-21 ENCOUNTER — Ambulatory Visit (INDEPENDENT_AMBULATORY_CARE_PROVIDER_SITE_OTHER): Payer: Self-pay | Admitting: Orthopedic Surgery

## 2016-08-21 DIAGNOSIS — L089 Local infection of the skin and subcutaneous tissue, unspecified: Secondary | ICD-10-CM | POA: Diagnosis not present

## 2016-08-21 DIAGNOSIS — I252 Old myocardial infarction: Secondary | ICD-10-CM | POA: Diagnosis not present

## 2016-08-21 DIAGNOSIS — B353 Tinea pedis: Secondary | ICD-10-CM | POA: Diagnosis not present

## 2016-08-21 DIAGNOSIS — Z87891 Personal history of nicotine dependence: Secondary | ICD-10-CM | POA: Diagnosis not present

## 2016-08-21 DIAGNOSIS — M79671 Pain in right foot: Secondary | ICD-10-CM | POA: Diagnosis present

## 2016-08-21 MED ORDER — CLINDAMYCIN HCL 150 MG PO CAPS: 300.0000 mg | ORAL_CAPSULE | Freq: Three times a day (TID) | ORAL | 0 refills | Status: DC

## 2016-08-21 MED ORDER — CLOTRIMAZOLE 1 % EX CREA: TOPICAL_CREAM | CUTANEOUS | 0 refills | Status: DC

## 2016-08-21 NOTE — ED Notes (Signed)
ED Provider at bedside. 

## 2016-08-21 NOTE — ED Triage Notes (Signed)
Pt c/o swelling to toes of R foot x2 days.

## 2016-08-21 NOTE — Discharge Instructions (Signed)
Take the prescribed medication as directed.  Keep feet clean with soap and warm water.  Make sure to wear cotton socks to help feet breathe, especially when wearing thick soled shoes such as boots. Follow-up with a primary care doctor in the area.   Return to the ED for new or worsening symptoms.

## 2016-08-21 NOTE — ED Provider Notes (Signed)
[name]   CSN: [id number] Arrival date & time: 08/21/16  0751     History   Chief Complaint Chief Complaint  Patient presents with  . Foot Pain    HPI Chris Fox is a 46 y.o. male.  The history is provided by the patient and medical records.  Foot Pain     46 year old male here with right foot pain. Reports he first noticed this about 2 days ago. States his right distal foot and toes started swelling and having some mild redness. Reports increased pain when walking. He denies any fever. He does have history of gout, however reports this feels different. Reports he saw his feet were hurting because of issues he finally para shoes without change. He has not tried any over-the-counter medications.  Past Medical History:  Diagnosis Date  . Abdominal pain    a. LLQ 11/2011  . GERD (gastroesophageal reflux disease)   . Gout   . Headache(784.0)   . Midsternal chest pain    a. Cardiac cath 12/10/11 with normal coronaries and LV systolic function;  [id number] Echo EF 55-60%, no valvular abnormalities or pericardial effusion  . Pericarditis    a. presumed - 11/2011  . ST elevation    a. Anterior ST elevation ? early repolarization vs pericarditis    Patient Active Problem List   Diagnosis Date Noted  . Midsternal chest pain   . ST elevation   . Abdominal pain   . Chest pain 12/10/2011  . Bradycardia 12/10/2011    Past Surgical History:  Procedure Laterality Date  . CARDIAC CATHETERIZATION  12/10/11   normal coronaries and LV systolic function;  Echo with normal LV function, EF 55-60%, no valvular abnormalities or pericardial effusion  . LEFT HEART CATHETERIZATION WITH CORONARY ANGIOGRAM N/A 12/10/2011   Procedure: LEFT HEART CATHETERIZATION WITH CORONARY ANGIOGRAM;  Surgeon: Larey Dresser, MD;  Location: [facility] CATH LAB;  Service: Cardiovascular;  Laterality: N/A;       Home Medications    Prior to Admission medications   Medication Sig Start  Date End Date Taking? Authorizing Provider  acetaminophen (TYLENOL) 500 MG tablet Take 2 tablets (1,000 mg total) by mouth every 6 (six) hours as needed for mild pain. 06/13/16   Leo Grosser, MD  cephALEXin (KEFLEX) 500 MG capsule Take 2 capsules (1,000 mg total) by mouth 2 (two) times daily. 02/21/16   Dalia Heading, PA-C  HYDROcodone-acetaminophen (NORCO) 5-325 MG tablet Take 1 tablet by mouth every 6 (six) hours as needed for moderate pain. 08/19/16   Ozella Almond Ward, PA-C  ibuprofen (ADVIL,MOTRIN) 800 MG tablet Take 1 tablet (800 mg total) by mouth every 8 (eight) hours as needed. 02/21/16   Dalia Heading, PA-C  methocarbamol (ROBAXIN) 500 MG tablet Take 1 tablet (500 mg total) by mouth 2 (two) times daily as needed for muscle spasms. 08/19/16   [place], PA-C    Family History Family History  Problem Relation Age of Onset  . Diabetes Maternal Grandmother     Social History Social History  Substance Use Topics  . Smoking status: Former Smoker    Packs/day: 1.50    Years: 21.00    Types: Cigarettes    Quit date: 09/11/2002  . Smokeless tobacco: Never Used  . Alcohol use No     Allergies   Colchicine   Review of Systems Review of Systems  Musculoskeletal: Positive for arthralgias.  Skin: Positive for color change.  All other systems reviewed and  are negative.    Physical Exam Updated Vital Signs BP 134/84 (BP Location: Left Arm)   Pulse 83   Temp 99.7 F (37.6 C) (Oral)   Resp 18   Ht 5\' 8"  (1.727 m)   Wt 81.6 kg   SpO2 100%   BMI 27.37 kg/m   Physical Exam  Constitutional: He is oriented to person, place, and time. He appears well-developed and well-nourished.  HENT:  Head: Normocephalic and atraumatic.  Mouth/Throat: Oropharynx is clear and moist.  Eyes: Conjunctivae and EOM are normal. Pupils are equal, round, and reactive to light.  Neck: Normal range of motion.  Cardiovascular: Normal rate, regular rhythm and normal heart sounds.     Pulmonary/Chest: Effort normal and breath sounds normal.  Abdominal: Soft. Bowel sounds are normal.  Musculoskeletal: Normal range of motion.  Fungal infection noted in webbed spaces between all toes, skin is somewhat macerated in these areas; mild erythema and warmth to touch along distal dorsal foot just proximal to the toes with some extension into mid-foot; there is no bony deformity; no fluctuance or signs of abscess formation; no tissue crepitus; DP pulse intact; normal sensation throughout foot; ambulatory with steady gait  Neurological: He is alert and oriented to person, place, and time.  Skin: Skin is warm and dry.  Psychiatric: He has a normal mood and affect.  Nursing note and vitals reviewed.    ED Treatments / Results  Labs (all labs ordered are listed, but only abnormal results are displayed) Labs Reviewed - No data to display  EKG  EKG Interpretation None       Radiology No results found.  Procedures Procedures (including critical care time)  Medications Ordered in ED Medications - No data to display   Initial Impression / Assessment and Plan / ED Course  I have reviewed the triage vital signs and the nursing notes.  Pertinent labs & imaging results that were available during my care of the patient were reviewed by me and considered in my medical decision making (see chart for details).  46 year old male here with right foot pain. He is afebrile and nontoxic. On exam he does appear to have fungal infection in the webspaces between his toes, skin is somewhat macerated in these areas. He also has some erythema of the distal dorsal foot with some extension into the midfoot. This area is warm to touch. There is no fluctuance or signs of abscess formation. No tissue crepitus. No bony deformity. Foot is neurovascularly intact. At this time, concern for fungal infection and possible early superimposed cellulitis. Lower suspicion for septic joint, gout flare, or acute  fracture/dislocation.  We will start him on Lotrimin cream as well as clindamycin. Patient does not currently have a PCP, have encouraged him to establish care with local office for follow-up.  Discussed plan with patient, he acknowledged understanding and agreed with plan of care.  Return precautions given for new or worsening symptoms.  Final Clinical Impressions(s) / ED Diagnoses   Final diagnoses:  Fungal infection right foot    New Prescriptions Discharge Medication List as of 08/21/2016  8:50 AM    START taking these medications   Details  clindamycin (CLEOCIN) 150 MG capsule Take 2 capsules (300 mg total) by mouth 3 (three) times daily. May dispense as 150mg  capsules, Starting Wed 08/21/2016, Print    clotrimazole (LOTRIMIN) 1 % cream Apply in between all toes of right foot cdaily., Print         Larene Pickett,  PA-C 08/21/16 0920    Carmin Muskrat, MD 08/21/16 1021

## 2016-08-25 ENCOUNTER — Emergency Department (HOSPITAL_COMMUNITY): Payer: BLUE CROSS/BLUE SHIELD

## 2016-08-25 ENCOUNTER — Encounter (HOSPITAL_COMMUNITY): Payer: Self-pay | Admitting: Emergency Medicine

## 2016-08-25 ENCOUNTER — Inpatient Hospital Stay (HOSPITAL_COMMUNITY)
Admission: EM | Admit: 2016-08-25 | Discharge: 2016-08-29 | DRG: 580 | Disposition: A | Discharge status: Home / Self Care | Payer: BLUE CROSS/BLUE SHIELD | Attending: Internal Medicine | Admitting: Internal Medicine

## 2016-08-25 DIAGNOSIS — L02619 Cutaneous abscess of unspecified foot: Secondary | ICD-10-CM | POA: Diagnosis present

## 2016-08-25 DIAGNOSIS — M109 Gout, unspecified: Secondary | ICD-10-CM

## 2016-08-25 DIAGNOSIS — L02415 Cutaneous abscess of right lower limb: Secondary | ICD-10-CM | POA: Diagnosis not present

## 2016-08-25 DIAGNOSIS — Z888 Allergy status to other drugs, medicaments and biological substances status: Secondary | ICD-10-CM

## 2016-08-25 DIAGNOSIS — L02611 Cutaneous abscess of right foot: Principal | ICD-10-CM | POA: Diagnosis present

## 2016-08-25 DIAGNOSIS — Z8679 Personal history of other diseases of the circulatory system: Secondary | ICD-10-CM

## 2016-08-25 DIAGNOSIS — R937 Abnormal findings on diagnostic imaging of other parts of musculoskeletal system: Secondary | ICD-10-CM | POA: Diagnosis not present

## 2016-08-25 DIAGNOSIS — Z87891 Personal history of nicotine dependence: Secondary | ICD-10-CM | POA: Diagnosis not present

## 2016-08-25 DIAGNOSIS — L03115 Cellulitis of right lower limb: Secondary | ICD-10-CM | POA: Diagnosis not present

## 2016-08-25 DIAGNOSIS — K219 Gastro-esophageal reflux disease without esophagitis: Secondary | ICD-10-CM | POA: Diagnosis not present

## 2016-08-25 DIAGNOSIS — L089 Local infection of the skin and subcutaneous tissue, unspecified: Secondary | ICD-10-CM | POA: Diagnosis not present

## 2016-08-25 LAB — BASIC METABOLIC PANEL
Anion gap: 9 (ref 5–15)
BUN: 9 mg/dL (ref 6–20)
CHLORIDE: 101 mmol/L (ref 101–111)
CO2: 27 mmol/L (ref 22–32)
CREATININE: 0.91 mg/dL (ref 0.61–1.24)
Calcium: 9.2 mg/dL (ref 8.9–10.3)
GFR calc Af Amer: >60 mL/min (ref >60)
GFR calc non Af Amer: >60 mL/min (ref >60)
GLUCOSE: 92 mg/dL (ref 65–99)
POTASSIUM: 3.7 mmol/L (ref 3.5–5.1)
Sodium: 137 mmol/L (ref 135–145)

## 2016-08-25 LAB — CBC
HCT: 37.5 % — ABNORMAL LOW (ref 39.0–52.0)
Hemoglobin: 12 g/dL — ABNORMAL LOW (ref 13.0–17.0)
MCH: 30 pg (ref 26.0–34.0)
MCHC: 32 g/dL (ref 30.0–36.0)
MCV: 93.8 fL (ref 78.0–100.0)
PLATELETS: 218 10*3/uL (ref 150–400)
RBC: 4 MIL/uL — ABNORMAL LOW (ref 4.22–5.81)
RDW: 12.2 % (ref 11.5–15.5)
WBC: 10 10*3/uL (ref 4.0–10.5)

## 2016-08-25 MED ORDER — METHOCARBAMOL 500 MG PO TABS
500.0000 mg | ORAL_TABLET | Freq: Two times a day (BID) | ORAL | Status: DC | PRN
  Administered 2016-08-26 – 2016-08-28 (×4): 500 mg via ORAL
  Filled 2016-08-25 (×5): qty 1

## 2016-08-25 MED ORDER — ACETAMINOPHEN 500 MG PO TABS: 1000.0000 mg | ORAL_TABLET | Freq: Four times a day (QID) | ORAL | Status: DC | PRN

## 2016-08-25 MED ORDER — CEFAZOLIN IN D5W 1 GM/50ML IV SOLN: 1.0000 g | Freq: Once | INTRAVENOUS | Status: DC

## 2016-08-25 MED ORDER — LIDOCAINE HCL (PF) 1 % IJ SOLN
15.0000 mL | Freq: Once | INTRAMUSCULAR | Status: AC
Start: 2016-08-25 — End: 2016-08-25
  Administered 2016-08-25: 15 mL
  Filled 2016-08-25: qty 15

## 2016-08-25 MED ORDER — BUPIVACAINE HCL (PF) 0.5 % IJ SOLN
20.0000 mL | Freq: Once | INTRAMUSCULAR | Status: AC
  Administered 2016-08-25: 20 mL
  Filled 2016-08-25: qty 20

## 2016-08-25 MED ORDER — MORPHINE SULFATE (PF) 4 MG/ML IV SOLN
2.0000 mg | Freq: Once | INTRAVENOUS | Status: AC
  Administered 2016-08-25: 2 mg via INTRAVENOUS
  Filled 2016-08-25: qty 1

## 2016-08-25 MED ORDER — HYDROCODONE-ACETAMINOPHEN 5-325 MG PO TABS
1.0000 | ORAL_TABLET | Freq: Four times a day (QID) | ORAL | Status: DC | PRN
  Administered 2016-08-25 – 2016-08-28 (×6): 1 via ORAL
  Filled 2016-08-25 (×6): qty 1

## 2016-08-25 MED ORDER — MORPHINE SULFATE (PF) 4 MG/ML IV SOLN
2.0000 mg | INTRAVENOUS | Status: DC | PRN
  Administered 2016-08-25 – 2016-08-28 (×14): 2 mg via INTRAVENOUS
  Filled 2016-08-25 (×14): qty 1

## 2016-08-25 MED ORDER — SODIUM CHLORIDE 0.9 % IV SOLN
INTRAVENOUS | Status: DC
  Administered 2016-08-28: 05:00:00 via INTRAVENOUS

## 2016-08-25 MED ORDER — ONDANSETRON HCL 4 MG/2ML IJ SOLN: 4.0000 mg | Freq: Four times a day (QID) | INTRAMUSCULAR | Status: DC | PRN

## 2016-08-25 MED ORDER — PIPERACILLIN-TAZOBACTAM 3.375 G IVPB
3.3750 g | Freq: Three times a day (TID) | INTRAVENOUS | Status: DC
  Administered 2016-08-26 – 2016-08-28 (×9): 3.375 g via INTRAVENOUS
  Filled 2016-08-25 (×10): qty 50

## 2016-08-25 MED ORDER — CEFAZOLIN IN D5W 1 GM/50ML IV SOLN
1.0000 g | Freq: Once | INTRAVENOUS | Status: AC
  Administered 2016-08-25: 1 g via INTRAVENOUS
  Filled 2016-08-25: qty 50

## 2016-08-25 MED ORDER — LIDOCAINE-EPINEPHRINE (PF) 2 %-1:200000 IJ SOLN
10.0000 mL | Freq: Once | INTRAMUSCULAR | Status: AC
  Administered 2016-08-25: 10 mL via INTRADERMAL
  Filled 2016-08-25: qty 20

## 2016-08-25 MED ORDER — MORPHINE SULFATE (PF) 4 MG/ML IV SOLN
4.0000 mg | Freq: Once | INTRAVENOUS | Status: AC
  Administered 2016-08-25: 4 mg via INTRAVENOUS
  Filled 2016-08-25: qty 1

## 2016-08-25 MED ORDER — ONDANSETRON HCL 4 MG PO TABS
4.0000 mg | ORAL_TABLET | Freq: Four times a day (QID) | ORAL | Status: DC | PRN
  Administered 2016-08-25: 4 mg via ORAL
  Filled 2016-08-25: qty 1

## 2016-08-25 MED ORDER — VANCOMYCIN HCL 10 G IV SOLR
1500.0000 mg | Freq: Two times a day (BID) | INTRAVENOUS | Status: DC
  Administered 2016-08-26 – 2016-08-28 (×5): 1500 mg via INTRAVENOUS
  Filled 2016-08-25 (×7): qty 1500

## 2016-08-25 NOTE — ED Triage Notes (Signed)
Pt. Stated, I started having my big toe to start swelling and hurting, and now its on my foot and almost to my ankle

## 2016-08-25 NOTE — ED Provider Notes (Signed)
[facility] DEPT Provider Note   CSN: [id number] Arrival date & time: 08/25/16  1118  By signing my name below, I, Avnee Patel, attest that this documentation has been prepared under the direction and in the presence of Pattricia Boss, MD  Electronically Signed: Delton [place], ED Scribe. 08/25/16. 1:23 PM.   History   Chief Complaint Chief Complaint  Patient presents with  . Toe Pain  . Foot Swelling    The history is provided by the patient. No language interpreter was used.   HPI Comments:  Chris Fox is a 46 y.o. male, with a hx of pericarditis, who presents to the Emergency Department complaining of worsening, "10/10" right foot pain and swelling x 5 days. He states his symtpoms began at the toes and spread to the rest of his right foot. His pain is worse to the touch. He also reports numbness to his right big toe, subjective fevers and mild abdominal pain. Pt notes a hx of 1 episode of similar symptoms in the past. He has taken 2-200 mg Advil at 12 PM with no significant relief. Pt denies ant major heath problems, family hx of gout, daily medication use, any recent falls/injuries and any other associated symptoms at this time. He is allergic to gout medication. Pt is a smoker but denies alcohol and IV drug use. Last meal was today. No PCP.  Per chart review, pt visited the ED on 08/21/16 for similar symptoms and was prescribed clindamycin and clotrimazole cream.     Past Medical History:  Diagnosis Date  . Abdominal pain    a. LLQ 11/2011  . GERD (gastroesophageal reflux disease)   . Gout   . Headache(784.0)   . Midsternal chest pain    a. Cardiac cath 12/10/11 with normal coronaries and LV systolic function;  [id number] Echo EF 55-60%, no valvular abnormalities or pericardial effusion  . Pericarditis    a. presumed - 11/2011  . ST elevation    a. Anterior ST elevation ? early repolarization vs pericarditis    Patient Active Problem List   Diagnosis Date Noted  .  Midsternal chest pain   . ST elevation   . Abdominal pain   . Chest pain 12/10/2011  . Bradycardia 12/10/2011    Past Surgical History:  Procedure Laterality Date  . CARDIAC CATHETERIZATION  12/10/11   normal coronaries and LV systolic function;  Echo with normal LV function, EF 55-60%, no valvular abnormalities or pericardial effusion  . LEFT HEART CATHETERIZATION WITH CORONARY ANGIOGRAM N/A 12/10/2011   Procedure: LEFT HEART CATHETERIZATION WITH CORONARY ANGIOGRAM;  Surgeon: Larey Dresser, MD;  Location: [facility] CATH LAB;  Service: Cardiovascular;  Laterality: N/A;    Home Medications    Prior to Admission medications   Medication Sig Start Date End Date Taking? Authorizing Provider  acetaminophen (TYLENOL) 500 MG tablet Take 2 tablets (1,000 mg total) by mouth every 6 (six) hours as needed for mild pain. 06/13/16   Leo Grosser, MD  cephALEXin (KEFLEX) 500 MG capsule Take 2 capsules (1,000 mg total) by mouth 2 (two) times daily. 02/21/16   Dalia Heading, PA-C  clindamycin (CLEOCIN) 150 MG capsule Take 2 capsules (300 mg total) by mouth 3 (three) times daily. May dispense as 150mg  capsules 08/21/16   Larene Pickett, PA-C  clotrimazole (LOTRIMIN) 1 % cream Apply in between all toes of right foot cdaily. 08/21/16   Larene Pickett, PA-C  HYDROcodone-acetaminophen (NORCO) 5-325 MG tablet Take 1 tablet by mouth every 6 (  six) hours as needed for moderate pain. 08/19/16   Ozella Almond Ward, PA-C  ibuprofen (ADVIL,MOTRIN) 800 MG tablet Take 1 tablet (800 mg total) by mouth every 8 (eight) hours as needed. 02/21/16   Dalia Heading, PA-C  methocarbamol (ROBAXIN) 500 MG tablet Take 1 tablet (500 mg total) by mouth 2 (two) times daily as needed for muscle spasms. 08/19/16   [place], PA-C    Family History Family History  Problem Relation Age of Onset  . Diabetes Maternal Grandmother     Social History Social History  Substance Use Topics  . Smoking status: Former Smoker     Packs/day: 1.50    Years: 21.00    Types: Cigarettes    Quit date: 09/11/2002  . Smokeless tobacco: Never Used  . Alcohol use No     Allergies   Colchicine   Review of Systems Review of Systems  Constitutional: Negative for fever.  HENT: Negative for sore throat.   Eyes: Negative for visual disturbance.  Respiratory: Negative for cough and shortness of breath.   Cardiovascular: Negative for chest pain.  Gastrointestinal: Negative for diarrhea, nausea and vomiting.  Musculoskeletal: Positive for myalgias.  Skin: Positive for color change.  All other systems reviewed and are negative.  Physical Exam Updated Vital Signs BP 120/80 (BP Location: Left Arm)   Pulse 87   Temp 98.6 F (37 C) (Oral)   Resp 17   Ht 5\' 8"  (1.727 m)   SpO2 98%   Physical Exam  Constitutional: He is oriented to person, place, and time. He appears well-developed and well-nourished. No distress.  HENT:  Head: Normocephalic and atraumatic.  Eyes: Conjunctivae are normal.  Cardiovascular: Normal rate.   Pulmonary/Chest: Effort normal.  Abdominal: He exhibits no distension.  Musculoskeletal: He exhibits edema and tenderness.  Swelling from toes to ankle on dorsal aspect of the right foot. There is erythema spreading proximally to the ankle. Swelling between the 2nd and 3rd phalanx. There is also swelling and tenderness to the palmar aspect of the right foot. 4 cm x 4 cm palpable fluctuant area.  Proximal to this area the leg is soft with good pulses and good sensation.   Neurological: He is alert and oriented to person, place, and time.  Skin: Skin is warm and dry. There is erythema.  Psychiatric: He has a normal mood and affect.  Nursing note and vitals reviewed.       ED Treatments / Results  DIAGNOSTIC STUDIES:  Oxygen Saturation is 98% on RA, normal by my interpretation.    COORDINATION OF CARE:  1:22 PM Will order lab work and imaging. Discussed treatment plan with pt at bedside and pt  agreed to plan.  1:50 PM I&D performed at this time. Will consult Dr. Daylene Katayama.   2:17 PM Pt will be admitted to the ED. Dr. Amalia Hailey will operate on pt at 9 PM today.    Labs (all labs ordered are listed, but only abnormal results are displayed) Labs Reviewed  CULTURE, BLOOD (ROUTINE X 2)  CULTURE, BLOOD (ROUTINE X 2)  CBC  BASIC METABOLIC PANEL    EKG  EKG Interpretation None       Radiology Dg Foot 2 Views Right  Result Date: 08/25/2016 CLINICAL DATA:  Patient with possible abscess along the dorsal aspect of the foot. EXAM: RIGHT FOOT - 2 VIEW COMPARISON:  None. FINDINGS: Along the dorsal aspect of the foot anteriorly there is focal skin thickening with central lucency which likely  represents a small amount of associated gas. Soft tissue thickening/ edema about the midfoot. Midfoot degenerative changes. No evidence for osseous cortical destruction. IMPRESSION: Focal skin thickening of the dorsal aspect of the forefoot with central lucency which may represent abscess and central gas. Electronically Signed   By: Lovey Newcomer M.D.   On: 08/25/2016 13:20    Procedures .Marland KitchenIncision and Drainage Date/Time: 08/25/2016 1:38 PM Performed by: Pattricia Boss Authorized by: Pattricia Boss   Consent:    Consent obtained:  Verbal   Consent given by:  Patient Location:    Type:  Abscess   Size:  4cm x 4cm    Location:  Lower extremity   Lower extremity location:  Foot   Foot location:  R foot Pre-procedure details:    Skin preparation:  Betadine Anesthesia (see MAR for exact dosages):    Anesthesia method:  Local infiltration   Local anesthetic:  Lidocaine 2% WITH epi Procedure type:    Complexity:  Simple Post-procedure details:    Patient tolerance of procedure:  Tolerated well, no immediate complications   (including critical care time)  Medications Ordered in ED Medications  ceFAZolin (ANCEF) IVPB 1 g/50 mL premix (1 g Intravenous New Bag/Given 08/25/16 1318)  morphine 4  MG/ML injection 2 mg (2 mg Intravenous Given 08/25/16 1318)     Initial Impression / Assessment and Plan / ED Course  I have reviewed the triage vital signs and the nursing notes.  Pertinent labs & imaging results that were available during my care of the patient were reviewed by me and considered in my medical decision making (see chart for details).     Discussed with Dr Amalia Hailey and he will take to or tonight.  Requested admission to hospitalist.  Discussed with Ms. Gerald Dexter and they will admit.   Final Clinical Impressions(s) / ED Diagnoses   Final diagnoses:  Foot abscess    New Prescriptions New Prescriptions   No medications on file  I personally performed the services described in this documentation, which was scribed in my presence. The recorded information has been reviewed and considered.    Pattricia Boss, MD 08/26/16 1944

## 2016-08-25 NOTE — ED Notes (Signed)
Patient transported to X-ray 

## 2016-08-25 NOTE — Progress Notes (Signed)
Pharmacy Antibiotic Note  Chris Fox is a 46 y.o. male admitted on 08/25/2016 with R foot infection. For I&D in [place]. Pharmacy has been consulted for Vancomycin dosing. Pt also on Zosyn.  Plan: Vancomycin 1500mg  IV q12h Will f/u micro data, renal function, and pt's clinical condition Vanc trough prn   Height: 5\' 8"  (172.7 cm) Weight: 180 lb (81.6 kg) IBW/kg (Calculated) : 68.4  Temp (24hrs), Avg:98.5 F (36.9 C), Min:98.1 F (36.7 C), Max:98.7 F (37.1 C)   Recent Labs Lab 08/25/16 1318  WBC 10.0  CREATININE 0.91    Estimated Creatinine Clearance: 98.1 mL/min (by C-G formula based on SCr of 0.91 mg/dL).    Allergies  Allergen Reactions  . Colchicine Other (See Comments)    GI pain    Antimicrobials this admission: 1/28 Zosyn >>  1/28 Vanc >>   Dose adjustments this admission: n/a  Microbiology results: 1/28 BCx x2:   Thank you for allowing pharmacy to be a part of this patient's care.  Sherlon Handing, PharmD, BCPS Clinical pharmacist, pager [phone] 08/25/2016 10:53 PM

## 2016-08-25 NOTE — Consult Note (Signed)
Chris Fox Male DOB: 18-Oct-1970 Provider: Rogelia Mire, NP  REASON FOR CONSULT: Cellulitis with abscess right foot  BRIEF HISTORY: 46yo otherwise healthy male presents with cellulitis and abscess of the right foot. Patient presented earlier today at the [facility] Emergency Dept where he was treated under the care of Dr. Pattricia Boss, MD . At that time patient was admitted for IV abx and podiatry consulted.  Patient states that he noticed his foot "blow up" ever since Tuesday, 08/20/16. Patient went to the ED on 08/21/16 at which time he was diagnosed with a fungal infection to the right foot.   SIGNIFICANT PAST HISTORY: Patient states his LT knee "blew up with pus" approximtely 2 months prior. Also the patient states that he had a wooden splinter "go all the way through" his right posterior leg approximately 2 months ago as well.   Patient Active Problem List   Diagnosis Date Noted  . Foot abscess 08/25/2016  . Gastroesophageal reflux disease 08/25/2016  . Gout 08/25/2016  . Midsternal chest pain   . ST elevation   . Abdominal pain   . Chest pain 12/10/2011  . Bradycardia 12/10/2011   OBJECTIVE:  Derm: Small iatrogenic lesion between the second and third digits right foot dorsal secondary to I&D in ED earlier this PM . There is no malodor. No other lacerations or open lesions noted to the RLE.  Vasc: Erythema and edema noted diffusely throughout the entire right foot extending proximal to the level of the rearfoot/ankle. Skin is warm to touch compared to the contralateral limb.  Neuro: intact Radiological Exam: I personally reviewed the xrays taken in the ED of the right foot. Diffuse swelling noted to the soft tissues of the right foot. There is a solitary radiolucency noted just dorsal to the MPJs on lateral view, not likely that this is gas within the soft tissue.   CBC Latest Ref Rng & Units 08/25/2016 10/13/2015 10/31/2014  WBC 4.0 - 10.5 K/uL 10.0 4.0 4.2  Hemoglobin 13.0 - 17.0  g/dL 12.0(L) 13.3 12.8(L)  Hematocrit 39.0 - 52.0 % 37.5(L) 40.3 39.2  Platelets 150 - 400 K/uL 218 134(L) 203    BMP Latest Ref Rng & Units 08/25/2016 10/13/2015 10/31/2014  Glucose 65 - 99 mg/dL 92 118(H) 90  BUN 6 - 20 mg/dL 9 9 9   Creatinine 0.61 - 1.24 mg/dL 0.91 1.13 0.96  Sodium 135 - 145 mmol/L 137 129(L) 137  Potassium 3.5 - 5.1 mmol/L 3.7 3.0(L) 3.4(L)  Chloride 101 - 111 mmol/L 101 90(L) 105  CO2 22 - 32 mmol/L 27 26 25   Calcium 8.9 - 10.3 mg/dL 9.2 9.2 9.4    ASSESSMENT:  - cellulitis with abscess right foot  PLAN OF CARE:  - Patient was evaluated at bedside this PM - Consent obtained for bedside Incision and Drainage. Incision and drainage performed of the right foot after local anesthesia infiltration consisting of a 50-50 mixture of 1% lidocaine plain with 0.5% Marcaine plain totaling 25 mL in an ankle block fashion. Purulent drainage and necrotic tissue was expressed from the incision site.  - Deep wound cultures taken - Dry sterile dressings applied. - I spoke with the on-call hospitalist, and we discussed antibiotic management. Recommend 1 g vancomycin q24h and 3.375g zosyn q8h. Hospitalist physician agreed.  - Patient will require OR incision and drainage with copious irrigation right foot. Planned for tomorrow, 08/26/16 after 5pm. NPO after breakfast.  - Podiatry will follow while inpatient.   Please contact me  for any questions or concerns.  Cell# [phone]. Thank you for the consult.   Edrick Kins, DPM [facility]  Dr. Edrick Kins, [address]                                        [place], [place] [address]                Office [phone]  Fax [phone]

## 2016-08-25 NOTE — H&P (Signed)
History and Physical    Chris Fox [id number] DOB: 06-26-1971 DOA: 08/25/2016  PCP: No PCP Per Patient  Patient coming from:[facility] ome  Chief Complaint: right foot pain  HPI: Chris Fox is a 46 y.o. male with medical history significant of pericarditis diagnosed in 2013 (off and echocardiogram at that time were normal) , GERD, headache and get around the emergency department with complaints of right foot pain and swelling for 5 days. She reported that symptoms started with the base of the toe and in swelling and redness progressed proximally to her that there is a month for. He was treated with oral clindamycin and Keflex and foot pain with taking Advil, but did not get it improvement in symptoms. Since last night symptoms became progressively worse and he presented to the emergency room for evaluation   ED Course: On arrival to the emergency department patient was afebrile, his vital signs were stable, blood work revealed normal right cells count, normal BMP, foot x-ray showed focal skin thickening over the dorsal aspect of the forefoot with lucency which may represent abscess and central gas collection  Review of Systems: As per HPI otherwise 10 point review of systems negative.   Ambulatory Status: Ambulated independently until this issue  Past Medical History:  Diagnosis Date  . Abdominal pain    a. LLQ 11/2011  . GERD (gastroesophageal reflux disease)   . Gout   . Headache(784.0)   . Midsternal chest pain    a. Cardiac cath 12/10/11 with normal coronaries and LV systolic function;  [id number] Echo EF 55-60%, no valvular abnormalities or pericardial effusion  . Pericarditis    a. presumed - 11/2011  . ST elevation    a. Anterior ST elevation ? early repolarization vs pericarditis    Past Surgical History:  Procedure Laterality Date  . CARDIAC CATHETERIZATION  12/10/11   normal coronaries and LV systolic function;  Echo with normal LV function, EF 55-60%, no valvular  abnormalities or pericardial effusion  . LEFT HEART CATHETERIZATION WITH CORONARY ANGIOGRAM N/A 12/10/2011   Procedure: LEFT HEART CATHETERIZATION WITH CORONARY ANGIOGRAM;  Surgeon: Larey Dresser, MD;  Location: [facility] CATH LAB;  Service: Cardiovascular;  Laterality: N/A;    Social History   Social History  . Marital status: Single    Spouse name: N/A  . Number of children: N/A  . Years of education: N/A   Occupational History  . Not on file.   Social History Main Topics  . Smoking status: Former Smoker    Packs/day: 1.50    Years: 21.00    Types: Cigarettes    Quit date: 09/11/2002  . Smokeless tobacco: Never Used  . Alcohol use No  . Drug use: No     Comment: quit 1 month ago  . Sexual activity: No   Other Topics Concern  . Not on file   Social History Narrative  . No narrative on file    Allergies  Allergen Reactions  . Colchicine Other (See Comments)    GI pain    Family History  Problem Relation Age of Onset  . Diabetes Maternal Grandmother     Prior to Admission medications   Medication Sig Start Date End Date Taking? Authorizing Provider  acetaminophen (TYLENOL) 500 MG tablet Take 2 tablets (1,000 mg total) by mouth every 6 (six) hours as needed for mild pain. 06/13/16   Leo Grosser, MD  cephALEXin (KEFLEX) 500 MG capsule Take 2 capsules (1,000 mg total) by mouth 2 (  two) times daily. 02/21/16   Dalia Heading, PA-C  clindamycin (CLEOCIN) 150 MG capsule Take 2 capsules (300 mg total) by mouth 3 (three) times daily. May dispense as 150mg  capsules 08/21/16   Larene Pickett, PA-C  clotrimazole (LOTRIMIN) 1 % cream Apply in between all toes of right foot cdaily. 08/21/16   Larene Pickett, PA-C  HYDROcodone-acetaminophen (NORCO) 5-325 MG tablet Take 1 tablet by mouth every 6 (six) hours as needed for moderate pain. 08/19/16   Ozella Almond Ward, PA-C  ibuprofen (ADVIL,MOTRIN) 800 MG tablet Take 1 tablet (800 mg total) by mouth every 8 (eight) hours as needed.  02/21/16   Dalia Heading, PA-C  methocarbamol (ROBAXIN) 500 MG tablet Take 1 tablet (500 mg total) by mouth 2 (two) times daily as needed for muscle spasms. 08/19/16   Ozella Almond Ward, PA-C    Physical Exam: Vitals:   08/25/16 1156  BP: 120/80  Pulse: 87  Resp: 17  Temp: 98.6 F (37 C)  TempSrc: Oral  SpO2: 98%  Height: 5\' 8"  (1.727 m)     General: Appears calm and comfortable Eyes: PERRLA, EOMI, normal lids, iris ENT:  grossly normal hearing, lips & tongue, mucous membranes moist and intact Neck: no lymphoadenopathy, masses or thyromegaly Cardiovascular: RRR, no m/r/g. No JVD, carotid bruits. No LE edema.  Respiratory: bilateral no wheezes, rales, rhonchi or cracles. Normal respiratory effort. No accessory muscle use observed Abdomen: soft, non-tender, non-distended, no organomegaly or masses appreciated. BS present in all quadrants Skin: no rash, ulcers or induration seen on limited exam Musculoskeletal: grossly normal tone BUE and LLE, good ROM, no bony abnormality or joint deformities observed. Right foot swelling extending from the base of the toes to the ankle, with erythema and fluctuation palpable in the center of the swelling a near the third and fourth toes Psychiatric: grossly normal mood and affect, speech fluent and appropriate, alert and oriented x3 Neurologic: CN II-XII grossly intact, moves all extremities in coordinated fashion, sensation intact  Labs on Admission: I have personally reviewed following labs and imaging studies  CBC, BMP  GFR: Estimated Creatinine Clearance: 98.1 mL/min (by C-G formula based on SCr of 0.91 mg/dL).   Creatinine Clearance: Estimated Creatinine Clearance: 98.1 mL/min (by C-G formula based on SCr of 0.91 mg/dL).    Radiological Exams on Admission: Dg Foot 2 Views Right  Result Date: 08/25/2016 CLINICAL DATA:  Patient with possible abscess along the dorsal aspect of the foot. EXAM: RIGHT FOOT - 2 VIEW COMPARISON:  None.  FINDINGS: Along the dorsal aspect of the foot anteriorly there is focal skin thickening with central lucency which likely represents a small amount of associated gas. Soft tissue thickening/ edema about the midfoot. Midfoot degenerative changes. No evidence for osseous cortical destruction. IMPRESSION: Focal skin thickening of the dorsal aspect of the forefoot with central lucency which may represent abscess and central gas. Electronically Signed   By: Lovey Newcomer M.D.   On: 08/25/2016 13:20    EKG: Independently reviewed -   Assessment/Plan Principal Problem:   Foot abscess Active Problems:   GERD (gastroesophageal reflux disease)   Right foot abscess and Cellulitis The area was I/D in the ED and had large collection of puss Continue IV antibiotics,  Patient will be taken in the OR this  everning for surgical drainage by Dr, Amalia Hailey Blood cultures submitted  GERD Continue PPI  Gout Currently is not on any medications and has no active symptoms of gout Continue to monitor  DVT prophylaxis: SCD given upcoming surgery Code Status: full code Family Communication: none Disposition Plan: MedSurg Consults called: podiatry consult called by ED MD Admission status: observation    York Grice, PA-C Pager: [phone] [facility]  If 7PM-7AM, please contact night-coverage [email or web address] Password [facility]  08/25/2016, 3:17 PM

## 2016-08-26 ENCOUNTER — Encounter (HOSPITAL_COMMUNITY): Payer: Self-pay | Admitting: *Deleted

## 2016-08-26 DIAGNOSIS — L089 Local infection of the skin and subcutaneous tissue, unspecified: Secondary | ICD-10-CM | POA: Diagnosis not present

## 2016-08-26 DIAGNOSIS — L02611 Cutaneous abscess of right foot: Secondary | ICD-10-CM | POA: Diagnosis not present

## 2016-08-26 DIAGNOSIS — Z888 Allergy status to other drugs, medicaments and biological substances status: Secondary | ICD-10-CM | POA: Diagnosis not present

## 2016-08-26 DIAGNOSIS — L03115 Cellulitis of right lower limb: Secondary | ICD-10-CM | POA: Diagnosis not present

## 2016-08-26 DIAGNOSIS — L02619 Cutaneous abscess of unspecified foot: Secondary | ICD-10-CM | POA: Diagnosis not present

## 2016-08-26 DIAGNOSIS — M109 Gout, unspecified: Secondary | ICD-10-CM | POA: Diagnosis not present

## 2016-08-26 DIAGNOSIS — K219 Gastro-esophageal reflux disease without esophagitis: Secondary | ICD-10-CM | POA: Diagnosis not present

## 2016-08-26 DIAGNOSIS — Z8679 Personal history of other diseases of the circulatory system: Secondary | ICD-10-CM | POA: Diagnosis not present

## 2016-08-26 DIAGNOSIS — Z87891 Personal history of nicotine dependence: Secondary | ICD-10-CM | POA: Diagnosis not present

## 2016-08-26 LAB — SURGICAL PCR SCREEN
MRSA, PCR: NEGATIVE
Staphylococcus aureus: NEGATIVE

## 2016-08-26 MED ORDER — CHLORHEXIDINE GLUCONATE 4 % EX LIQD
60.0000 mL | Freq: Once | CUTANEOUS | Status: AC
  Administered 2016-08-27: 4 via TOPICAL

## 2016-08-26 NOTE — Progress Notes (Signed)
Spoke to Dr. Amalia Hailey about pt's surgery today. Stated that OR schedule is backed up and pt will have a better chance of having procedure at scheduled time tomorrow. Pt allowed to be on regular diet through breakfast tomorrow, and then resume NPO status. Situation explained to pt and family members, and all verbalized understanding/agreement. Will pass along to night RN and continue to monitor

## 2016-08-26 NOTE — Progress Notes (Signed)
[facility] Progress Note  Patient: Chris Fox [id number]   PCP: No PCP Per Patient DOB: [birth year]   DOA: 08/25/2016   DOS: 08/26/2016   Date of Service: the patient was seen and examined on 08/26/2016  Brief hospital course: Pt. with PMH of Gout, GERD; admitted on 08/25/2016, with complaint of right foot pain, was found to have right foot abscess with cellulitis. Currently further plan is to monitor postoperative course.  Assessment and Plan: 1. Foot abscess Podiatry consulted, patient scheduled for incision and drainage on 08/26/2016. We will monitor postoperative course. Monitor recommendation for podiatry. Currently on vancomycin and Zosyn. Preferably would be on Augmentin or clindamycin or doxycycline postprocedure pending on Gram stain and sensitivity.  2. GERD. Continue PPI.  Bowel regimen: last BM prior to admission Diet: regular diet DVT Prophylaxis: subcutaneous Heparin  Advance goals of care discussion: full code  Family Communication: no family was present at bedside, at the time of interview.   Disposition:  Discharge to home. Expected discharge date: 08/27/2016, post-op course  Consultants: podiatry Procedures: none  Antibiotics: Anti-infectives    Start     Dose/Rate Route Frequency Ordered Stop   08/25/16 2300  vancomycin (VANCOCIN) 1,500 mg in sodium chloride 0.9 % 500 mL IVPB     1,500 mg 250 mL/hr over 120 Minutes Intravenous Every 12 hours 08/25/16 2247     08/25/16 2300  piperacillin-tazobactam (ZOSYN) IVPB 3.375 g     3.375 g 12.5 mL/hr over 240 Minutes Intravenous Every 8 hours 08/25/16 2247     08/25/16 1530  ceFAZolin (ANCEF) IVPB 1 g/50 mL premix  Status:  Discontinued     1 g 100 mL/hr over 30 Minutes Intravenous  Once 08/25/16 1524 08/25/16 2252   08/25/16 1245  ceFAZolin (ANCEF) IVPB 1 g/50 mL premix     1 g 100 mL/hr over 30 Minutes Intravenous  Once 08/25/16 1234 08/25/16 1348        Subjective: No acute complaints,  continues to have a right foot pain.  Objective: Physical Exam: Vitals:   08/25/16 1156 08/25/16 1805 08/25/16 2035 08/26/16 0440  BP: 120/80 106/67 (!) 100/55 105/66  Pulse: 87 60 (!) 55 (!) 57  Resp: 17 18 16 16   Temp: 98.6 F (37 C) 98.7 F (37.1 C) 98.1 F (36.7 C) 97.8 F (36.6 C)  TempSrc: Oral Oral Oral Oral  SpO2: 98% 99% 98% 96%  Weight:   81.6 kg (180 lb)   Height: 5\' 8"  (1.727 m)  5\' 8"  (1.727 m)     Intake/Output Summary (Last 24 hours) at 08/26/16 1442 Last data filed at 08/26/16 0942  Gross per 24 hour  Intake              440 ml  Output                0 ml  Net              440 ml   Filed Weights   08/25/16 2035  Weight: 81.6 kg (180 lb)    General: Alert, Awake and Oriented to Time, Place and Person. Appear in mild distress, affect appropriate Eyes: PERRL, Conjunctiva normal ENT: Oral Mucosa clear moist. Neck: no JVD, no Abnormal Mass Or lumps Cardiovascular: S1 and S2 Present, no Murmur, Respiratory: Bilateral Air entry equal and Decreased, no use of accessory muscle, Clear to Auscultation, no Crackles, no wheezes Abdomen: Bowel Sound present, Soft and no tenderness Skin: no redness, no Rash, no induration  Extremities: trace Pedal edema, no calf tenderness Neurologic: Grossly no focal neuro deficit. Bilaterally Equal motor strength  Data Reviewed: CBC:  Recent Labs Lab 08/25/16 1318  WBC 10.0  HGB 12.0*  HCT 37.5*  MCV 93.8  PLT 99991111   Basic Metabolic Panel:  Recent Labs Lab 08/25/16 1318  NA 137  K 3.7  CL 101  CO2 27  GLUCOSE 92  BUN 9  CREATININE 0.91  CALCIUM 9.2   Studies: No results found.   Scheduled Meds: . chlorhexidine  60 mL Topical Once  . piperacillin-tazobactam (ZOSYN)  IV  3.375 g Intravenous Q8H  . vancomycin  1,500 mg Intravenous Q12H   Continuous Infusions: . sodium chloride 100 mL/hr at 08/26/16 0004   PRN Meds: acetaminophen, HYDROcodone-acetaminophen, methocarbamol, morphine injection, ondansetron  **OR** ondansetron (ZOFRAN) IV  Time spent: 30 minutes  Author: Berle Mull, MD [facility] Pager: [phone] 08/26/2016 2:42 PM  If 7PM-7AM, please contact night-coverage at [email or web address], password [facility]

## 2016-08-27 ENCOUNTER — Inpatient Hospital Stay (HOSPITAL_COMMUNITY): Payer: BLUE CROSS/BLUE SHIELD | Admitting: Anesthesiology

## 2016-08-27 ENCOUNTER — Encounter (HOSPITAL_COMMUNITY)
Admission: EM | Disposition: A | Discharge status: Home / Self Care | Payer: Self-pay | Source: Home / Self Care | Attending: Internal Medicine

## 2016-08-27 DIAGNOSIS — L02611 Cutaneous abscess of right foot: Secondary | ICD-10-CM

## 2016-08-27 HISTORY — PX: I & D EXTREMITY: SHX5045

## 2016-08-27 LAB — CBC
HCT: 34.2 % — ABNORMAL LOW (ref 39.0–52.0)
HEMOGLOBIN: 10.9 g/dL — AB (ref 13.0–17.0)
MCH: 29.5 pg (ref 26.0–34.0)
MCHC: 31.9 g/dL (ref 30.0–36.0)
MCV: 92.7 fL (ref 78.0–100.0)
Platelets: 213 10*3/uL (ref 150–400)
RBC: 3.69 MIL/uL — ABNORMAL LOW (ref 4.22–5.81)
RDW: 11.9 % (ref 11.5–15.5)
WBC: 5 10*3/uL (ref 4.0–10.5)

## 2016-08-27 LAB — BASIC METABOLIC PANEL
ANION GAP: 6 (ref 5–15)
BUN: 8 mg/dL (ref 6–20)
CHLORIDE: 106 mmol/L (ref 101–111)
CO2: 27 mmol/L (ref 22–32)
CREATININE: 0.98 mg/dL (ref 0.61–1.24)
Calcium: 8.6 mg/dL — ABNORMAL LOW (ref 8.9–10.3)
GFR calc Af Amer: >60 mL/min (ref >60)
GFR calc non Af Amer: >60 mL/min (ref >60)
GLUCOSE: 91 mg/dL (ref 65–99)
Potassium: 4 mmol/L (ref 3.5–5.1)
Sodium: 139 mmol/L (ref 135–145)

## 2016-08-27 SURGERY — IRRIGATION AND DEBRIDEMENT EXTREMITY
Anesthesia: Monitor Anesthesia Care | Site: Foot | Laterality: Right

## 2016-08-27 MED ORDER — FENTANYL CITRATE (PF) 100 MCG/2ML IJ SOLN
INTRAMUSCULAR | Status: AC
  Filled 2016-08-27: qty 2

## 2016-08-27 MED ORDER — MIDAZOLAM HCL 2 MG/2ML IJ SOLN
INTRAMUSCULAR | Status: DC | PRN
  Administered 2016-08-27: 2 mg via INTRAVENOUS

## 2016-08-27 MED ORDER — BUPIVACAINE HCL (PF) 0.5 % IJ SOLN
INTRAMUSCULAR | Status: DC | PRN
  Administered 2016-08-27: 9 mL

## 2016-08-27 MED ORDER — BUPIVACAINE HCL (PF) 0.5 % IJ SOLN
INTRAMUSCULAR | Status: AC
  Filled 2016-08-27: qty 30

## 2016-08-27 MED ORDER — PROMETHAZINE HCL 25 MG/ML IJ SOLN: 6.2500 mg | INTRAMUSCULAR | Status: DC | PRN

## 2016-08-27 MED ORDER — ONDANSETRON HCL 4 MG/2ML IJ SOLN
INTRAMUSCULAR | Status: AC
  Filled 2016-08-27: qty 2

## 2016-08-27 MED ORDER — LIDOCAINE HCL 2 % IJ SOLN
INTRAMUSCULAR | Status: AC
  Filled 2016-08-27: qty 20

## 2016-08-27 MED ORDER — 0.9 % SODIUM CHLORIDE (POUR BTL) OPTIME
TOPICAL | Status: DC | PRN
  Administered 2016-08-27: 1000 mL

## 2016-08-27 MED ORDER — MIDAZOLAM HCL 2 MG/2ML IJ SOLN
INTRAMUSCULAR | Status: AC
  Filled 2016-08-27: qty 2

## 2016-08-27 MED ORDER — LACTATED RINGERS IV SOLN
INTRAVENOUS | Status: DC | PRN
  Administered 2016-08-27: 18:00:00 via INTRAVENOUS

## 2016-08-27 MED ORDER — PROPOFOL 10 MG/ML IV BOLUS
INTRAVENOUS | Status: AC
  Filled 2016-08-27: qty 20

## 2016-08-27 MED ORDER — LIDOCAINE HCL 2 % IJ SOLN
INTRAMUSCULAR | Status: DC | PRN
  Administered 2016-08-27: 9 mL

## 2016-08-27 MED ORDER — SUCCINYLCHOLINE CHLORIDE 200 MG/10ML IV SOSY
PREFILLED_SYRINGE | INTRAVENOUS | Status: AC
  Filled 2016-08-27: qty 10

## 2016-08-27 MED ORDER — PROPOFOL 10 MG/ML IV BOLUS
INTRAVENOUS | Status: DC | PRN
Start: 2016-08-27 — End: 2016-08-27
  Administered 2016-08-27: 50 mg via INTRAVENOUS
  Administered 2016-08-27: 30 mg via INTRAVENOUS
  Administered 2016-08-27 (×3): 20 mg via INTRAVENOUS

## 2016-08-27 MED ORDER — HYDROMORPHONE HCL 1 MG/ML IJ SOLN: 0.2500 mg | INTRAMUSCULAR | Status: DC | PRN

## 2016-08-27 MED ORDER — LIDOCAINE 2% (20 MG/ML) 5 ML SYRINGE
INTRAMUSCULAR | Status: AC
  Filled 2016-08-27: qty 5

## 2016-08-27 MED ORDER — PROPOFOL 1000 MG/100ML IV EMUL
INTRAVENOUS | Status: AC
  Filled 2016-08-27: qty 100

## 2016-08-27 MED ORDER — FENTANYL CITRATE (PF) 100 MCG/2ML IJ SOLN
INTRAMUSCULAR | Status: DC | PRN
  Administered 2016-08-27: 100 ug via INTRAVENOUS

## 2016-08-27 SURGICAL SUPPLY — 42 items
BANDAGE ACE 4X5 VEL STRL LF (GAUZE/BANDAGES/DRESSINGS) IMPLANT
BLADE SURG 15 STRL LF DISP TIS (BLADE) ×3 IMPLANT
BLADE SURG 15 STRL SS (BLADE) ×3
BNDG COHESIVE 6X5 TAN STRL LF (GAUZE/BANDAGES/DRESSINGS) IMPLANT
BNDG GAUZE ELAST 4 BULKY (GAUZE/BANDAGES/DRESSINGS) ×4 IMPLANT
BOWL SMART MIX CTS (DISPOSABLE) IMPLANT
CHLORAPREP W/TINT 26ML (MISCELLANEOUS) ×2 IMPLANT
COVER SURGICAL LIGHT HANDLE (MISCELLANEOUS) ×2 IMPLANT
CUFF TOURNIQUET SINGLE 34IN LL (TOURNIQUET CUFF) ×2 IMPLANT
DRAPE U-SHAPE 47X51 STRL (DRAPES) ×2 IMPLANT
DRSG PAD ABDOMINAL 8X10 ST (GAUZE/BANDAGES/DRESSINGS) ×2 IMPLANT
ELECT REM PT RETURN 9FT ADLT (ELECTROSURGICAL)
ELECTRODE REM PT RTRN 9FT ADLT (ELECTROSURGICAL) IMPLANT
GAUZE PACKING IODOFORM 1/4X15 (GAUZE/BANDAGES/DRESSINGS) ×2 IMPLANT
GAUZE SPONGE 4X4 12PLY STRL (GAUZE/BANDAGES/DRESSINGS) ×2 IMPLANT
GAUZE XEROFORM 1X8 LF (GAUZE/BANDAGES/DRESSINGS) ×2 IMPLANT
GLOVE BIO SURGEON STRL SZ8 (GLOVE) ×2 IMPLANT
GLOVE BIOGEL PI IND STRL 8 (GLOVE) ×1 IMPLANT
GLOVE BIOGEL PI INDICATOR 8 (GLOVE) ×1
GOWN STRL REUS W/ TWL LRG LVL3 (GOWN DISPOSABLE) ×2 IMPLANT
GOWN STRL REUS W/TWL LRG LVL3 (GOWN DISPOSABLE) ×2
HANDPIECE INTERPULSE COAX TIP (DISPOSABLE)
KIT BASIN OR (CUSTOM PROCEDURE TRAY) ×2 IMPLANT
KIT ROOM TURNOVER OR (KITS) ×2 IMPLANT
MANIFOLD NEPTUNE II (INSTRUMENTS) ×2 IMPLANT
NEEDLE HYPO 25GX1X1/2 BEV (NEEDLE) ×2 IMPLANT
NS IRRIG 1000ML POUR BTL (IV SOLUTION) ×2 IMPLANT
PACK ORTHO EXTREMITY (CUSTOM PROCEDURE TRAY) ×2 IMPLANT
PAD ARMBOARD 7.5X6 YLW CONV (MISCELLANEOUS) ×4 IMPLANT
PADDING CAST COTTON 6X4 STRL (CAST SUPPLIES) ×2 IMPLANT
SCRUB BETADINE 4OZ XXX (MISCELLANEOUS) ×2 IMPLANT
SET HNDPC FAN SPRY TIP SCT (DISPOSABLE) IMPLANT
SOLUTION BETADINE 4OZ (MISCELLANEOUS) ×2 IMPLANT
SPONGE GAUZE 4X4 12PLY STER LF (GAUZE/BANDAGES/DRESSINGS) ×2 IMPLANT
STAPLER VISISTAT 35W (STAPLE) ×2 IMPLANT
STOCKINETTE IMPERVIOUS 9X36 MD (GAUZE/BANDAGES/DRESSINGS) ×2 IMPLANT
SUT PROLENE 3 0 PS 2 (SUTURE) ×2 IMPLANT
SYR CONTROL 10ML LL (SYRINGE) ×2 IMPLANT
TOWEL OR 17X24 6PK STRL BLUE (TOWEL DISPOSABLE) ×2 IMPLANT
TOWEL OR 17X26 10 PK STRL BLUE (TOWEL DISPOSABLE) ×2 IMPLANT
TUBE CONNECTING 12X1/4 (SUCTIONS) ×2 IMPLANT
YANKAUER SUCT BULB TIP NO VENT (SUCTIONS) ×2 IMPLANT

## 2016-08-27 NOTE — Progress Notes (Signed)
[facility] Progress Note  Patient: Chris Fox [id number]   PCP: No PCP Per Patient DOB: 05/12/1971   DOA: 08/25/2016   DOS: 08/27/2016   Date of Service: the patient was seen and examined on 08/27/2016  Brief hospital course: Pt. with PMH of Gout, GERD; admitted on 08/25/2016, with complaint of right foot pain, was found to have right foot abscess with cellulitis. Currently further plan is to monitor postoperative course.  Assessment and Plan: 1. Suspected foot abscess with cellulitis Podiatry consulted, patient scheduled for incision and drainage on 08/26/2016. Due to difficulty with schedule, procedure postponed to 08/27/2016. We will monitor postoperative course. Monitor recommendation for podiatry. Currently on vancomycin and Zosyn. Preferably would be on Augmentin or clindamycin or doxycycline postprocedure pending on Gram stain and sensitivity.  2. GERD. Continue PPI.  Bowel regimen: last BM 01/28 Diet: regular diet DVT Prophylaxis: subcutaneous Heparin  Advance goals of care discussion: full code  Family Communication: no family was present at bedside, at the time of interview.   Disposition:  Discharge to home. Expected discharge date: 08/28/2016, post-op course  Consultants: podiatry Procedures: none  Antibiotics: Anti-infectives    Start     Dose/Rate Route Frequency Ordered Stop   08/25/16 2300  vancomycin (VANCOCIN) 1,500 mg in sodium chloride 0.9 % 500 mL IVPB     1,500 mg 250 mL/hr over 120 Minutes Intravenous Every 12 hours 08/25/16 2247     08/25/16 2300  piperacillin-tazobactam (ZOSYN) IVPB 3.375 g     3.375 g 12.5 mL/hr over 240 Minutes Intravenous Every 8 hours 08/25/16 2247     08/25/16 1530  ceFAZolin (ANCEF) IVPB 1 g/50 mL premix  Status:  Discontinued     1 g 100 mL/hr over 30 Minutes Intravenous  Once 08/25/16 1524 08/25/16 2252   08/25/16 1245  ceFAZolin (ANCEF) IVPB 1 g/50 mL premix     1 g 100 mL/hr over 30 Minutes Intravenous   Once 08/25/16 1234 08/25/16 1348        Subjective: No acute complaints, continues to have a right foot pain.  Objective: Physical Exam: Vitals:   08/26/16 0440 08/26/16 2047 08/27/16 0704 08/27/16 1544  BP: 105/66 116/62 111/65 126/72  Pulse: (!) 57 (!) 54 (!) 47 (!) 50  Resp: 16 16 16 18   Temp: 97.8 F (36.6 C) 98.1 F (36.7 C) 97.9 F (36.6 C) 98.3 F (36.8 C)  TempSrc: Oral Oral Oral   SpO2: 96% 97% 100% 100%  Weight:      Height:        Intake/Output Summary (Last 24 hours) at 08/27/16 1615 Last data filed at 08/27/16 1300  Gross per 24 hour  Intake              550 ml  Output                0 ml  Net              550 ml   Filed Weights   08/25/16 2035  Weight: 81.6 kg (180 lb)    General: Alert, Awake and Oriented to Time, Place and Person. Appear in mild distress, affect appropriate Eyes: PERRL, Conjunctiva normal ENT: Oral Mucosa clear moist. Neck: no JVD, no Abnormal Mass Or lumps Cardiovascular: S1 and S2 Present, no Murmur, Respiratory: Bilateral Air entry equal and Decreased, no use of accessory muscle, Clear to Auscultation, no Crackles, no wheezes Abdomen: Bowel Sound present, Soft and no tenderness Skin: no redness, no Rash,  no induration Extremities: trace Pedal edema, no calf tenderness Neurologic: Grossly no focal neuro deficit. Bilaterally Equal motor strength  Data Reviewed: CBC:  Recent Labs Lab 08/25/16 1318 08/27/16 0334  WBC 10.0 5.0  HGB 12.0* 10.9*  HCT 37.5* 34.2*  MCV 93.8 92.7  PLT 218 [id number]   Basic Metabolic Panel:  Recent Labs Lab 08/25/16 1318 08/27/16 0334  NA 137 139  K 3.7 4.0  CL 101 106  CO2 27 27  GLUCOSE 92 91  BUN 9 8  CREATININE 0.91 0.98  CALCIUM 9.2 8.6*   Studies: No results found.   Scheduled Meds: . piperacillin-tazobactam (ZOSYN)  IV  3.375 g Intravenous Q8H  . vancomycin  1,500 mg Intravenous Q12H   Continuous Infusions: . sodium chloride 100 mL/hr at 08/26/16 0004   PRN Meds:  acetaminophen, HYDROcodone-acetaminophen, methocarbamol, morphine injection, ondansetron **OR** ondansetron (ZOFRAN) IV  Time spent: 30 minutes  Author: Berle Mull, MD [facility] Pager: [phone] 08/27/2016 4:15 PM  If 7PM-7AM, please contact night-coverage at [email or web address], password [facility]

## 2016-08-27 NOTE — Progress Notes (Signed)
Report called to Harlan Tidd-RN in short stay. All questions/concerns addressed. Will continue to monitor

## 2016-08-27 NOTE — Anesthesia Postprocedure Evaluation (Signed)
Anesthesia Post Note  Patient: Chris Fox  Procedure(s) Performed: Procedure(s) (LRB): IRRIGATION AND DEBRIDEMENT RIGHT FOOT (Right)  Patient location during evaluation: PACU Anesthesia Type: MAC Level of consciousness: awake and alert Pain management: pain level controlled Vital Signs Assessment: post-procedure vital signs reviewed and stable Respiratory status: spontaneous breathing, nonlabored ventilation, respiratory function stable and patient connected to nasal cannula oxygen Cardiovascular status: stable and blood pressure returned to baseline Anesthetic complications: no       Last Vitals:  Vitals:   08/27/16 1914 08/27/16 2016  BP: 106/72 120/83  Pulse: (!) 42 (!) 45  Resp: 13 14  Temp:  36.7 C    Last Pain:  Vitals:   08/27/16 2016  TempSrc: Oral  PainSc:                  [name],W. EDMOND

## 2016-08-27 NOTE — Anesthesia Preprocedure Evaluation (Addendum)
Anesthesia Evaluation  Patient identified by MRN, date of birth, ID band Patient awake    Reviewed: Allergy & Precautions, NPO status , Patient's Chart, lab work & pertinent test results  Airway Mallampati: II  TM Distance: >3 FB Neck ROM: Full    Dental no notable dental hx. (+) Dental Advisory Given   Pulmonary former smoker,    Pulmonary exam normal breath sounds clear to auscultation       Cardiovascular negative cardio ROS Normal cardiovascular exam Rhythm:Regular Rate:Normal     Neuro/Psych  Headaches, negative psych ROS   GI/Hepatic negative GI ROS, Neg liver ROS, GERD  ,  Endo/Other  negative endocrine ROS  Renal/GU negative Renal ROS     Musculoskeletal negative musculoskeletal ROS (+)   Abdominal   Peds  Hematology negative hematology ROS (+)   Anesthesia Other Findings   Reproductive/Obstetrics                            Anesthesia Physical Anesthesia Plan  ASA: II  Anesthesia Plan: General   Post-op Pain Management:    Induction: Intravenous  Airway Management Planned: LMA  Additional Equipment:   Intra-op Plan:   Post-operative Plan: Extubation in OR  Informed Consent: I have reviewed the patients History and Physical, chart, labs and discussed the procedure including the risks, benefits and alternatives for the proposed anesthesia with the patient or authorized representative who has indicated his/her understanding and acceptance.   Dental advisory given  Plan Discussed with: CRNA and Anesthesiologist  Anesthesia Plan Comments:        Anesthesia Quick Evaluation

## 2016-08-27 NOTE — Transfer of Care (Signed)
Immediate Anesthesia Transfer of Care Note  Patient: Chris Fox  Procedure(s) Performed: Procedure(s): IRRIGATION AND DEBRIDEMENT RIGHT FOOT (Right)  Patient Location: PACU  Anesthesia Type:MAC  Level of Consciousness: awake  Airway & Oxygen Therapy: Patient Spontanous Breathing  Post-op Assessment: Report given to RN and Post -op Vital signs reviewed and stable  Post vital signs: Reviewed and stable  Last Vitals:  Vitals:   08/27/16 1914 08/27/16 2016  BP: 106/72 120/83  Pulse: (!) 42 (!) 45  Resp: 13 14  Temp:  36.7 C    Last Pain:  Vitals:   08/27/16 2016  TempSrc: Oral  PainSc:          Complications: No apparent anesthesia complications

## 2016-08-28 ENCOUNTER — Encounter (HOSPITAL_COMMUNITY): Payer: Self-pay | Admitting: Podiatry

## 2016-08-28 LAB — CBC
HCT: 34.5 % — ABNORMAL LOW (ref 39.0–52.0)
Hemoglobin: 11.2 g/dL — ABNORMAL LOW (ref 13.0–17.0)
MCH: 29.8 pg (ref 26.0–34.0)
MCHC: 32.5 g/dL (ref 30.0–36.0)
MCV: 91.8 fL (ref 78.0–100.0)
PLATELETS: 222 10*3/uL (ref 150–400)
RBC: 3.76 MIL/uL — AB (ref 4.22–5.81)
RDW: 11.7 % (ref 11.5–15.5)
WBC: 5.7 10*3/uL (ref 4.0–10.5)

## 2016-08-28 LAB — BASIC METABOLIC PANEL
Anion gap: 6 (ref 5–15)
BUN: 6 mg/dL (ref 6–20)
CALCIUM: 8.4 mg/dL — AB (ref 8.9–10.3)
CO2: 26 mmol/L (ref 22–32)
CREATININE: 1.02 mg/dL (ref 0.61–1.24)
Chloride: 104 mmol/L (ref 101–111)
Glucose, Bld: 82 mg/dL (ref 65–99)
Potassium: 3.8 mmol/L (ref 3.5–5.1)
SODIUM: 136 mmol/L (ref 135–145)

## 2016-08-28 LAB — AEROBIC CULTURE  (SUPERFICIAL SPECIMEN): GRAM STAIN: NONE SEEN

## 2016-08-28 LAB — AEROBIC CULTURE W GRAM STAIN (SUPERFICIAL SPECIMEN)

## 2016-08-28 MED ORDER — AMOXICILLIN-POT CLAVULANATE 875-125 MG PO TABS
1.0000 | ORAL_TABLET | Freq: Two times a day (BID) | ORAL | Status: DC
  Administered 2016-08-28 – 2016-08-29 (×3): 1 via ORAL
  Filled 2016-08-28 (×3): qty 1

## 2016-08-28 MED ORDER — DIPHENHYDRAMINE HCL 25 MG PO CAPS: 25.0000 mg | ORAL_CAPSULE | Freq: Four times a day (QID) | ORAL | Status: DC | PRN

## 2016-08-28 MED ORDER — DOXYCYCLINE HYCLATE 100 MG PO TABS
100.0000 mg | ORAL_TABLET | Freq: Two times a day (BID) | ORAL | Status: DC
  Administered 2016-08-28 – 2016-08-29 (×3): 100 mg via ORAL
  Filled 2016-08-28 (×3): qty 1

## 2016-08-28 MED ORDER — METHOCARBAMOL 500 MG PO TABS
500.0000 mg | ORAL_TABLET | Freq: Three times a day (TID) | ORAL | Status: DC
  Administered 2016-08-28 – 2016-08-29 (×4): 500 mg via ORAL
  Filled 2016-08-28 (×3): qty 1

## 2016-08-28 MED ORDER — KETOROLAC TROMETHAMINE 15 MG/ML IJ SOLN
15.0000 mg | Freq: Three times a day (TID) | INTRAMUSCULAR | Status: DC
  Administered 2016-08-28 – 2016-08-29 (×2): 15 mg via INTRAVENOUS
  Filled 2016-08-28 (×2): qty 1

## 2016-08-28 MED ORDER — HYDROCODONE-ACETAMINOPHEN 5-325 MG PO TABS
1.0000 | ORAL_TABLET | Freq: Four times a day (QID) | ORAL | Status: DC | PRN
  Administered 2016-08-28: 2 via ORAL
  Administered 2016-08-29: 1 via ORAL
  Filled 2016-08-28 (×3): qty 2

## 2016-08-28 NOTE — Progress Notes (Signed)
[facility] Progress Note  Patient: Chris Fox [id number]   PCP: No PCP Per Patient DOB: [birth year]   DOA: 08/25/2016   DOS: 08/28/2016   Date of Service: the patient was seen and examined on 08/28/2016  Brief hospital course: Pt. with PMH of Gout, GERD; admitted on 08/25/2016, with complaint of right foot pain, was found to have right foot abscess with cellulitis. Currently further plan is to monitor postoperative course.  Assessment and Plan: 1. foot abscess with cellulitis Podiatry consulted, Underwent I and D on 08/27/2016 Monitor recommendation for podiatry. Currently on vancomycin and Zosyn. Change to Augmentin with doxycycline postprocedure  Deep cultures currently pending.  2. GERD. Continue PPI.  Bowel regimen: last BM 01/28 Diet: regular diet DVT Prophylaxis: subcutaneous Heparin  Advance goals of care discussion: full code  Family Communication: no family was present at bedside, at the time of interview.   Disposition:  Discharge to home. Expected discharge date: 08/29/2016, podiatry recommendation  Consultants: podiatry Procedures: none  Antibiotics: Anti-infectives    Start     Dose/Rate Route Frequency Ordered Stop   08/28/16 1100  doxycycline (VIBRA-TABS) tablet 100 mg     100 mg Oral Every 12 hours 08/28/16 1051     08/28/16 1100  amoxicillin-clavulanate (AUGMENTIN) 875-125 MG per tablet 1 tablet     1 tablet Oral Every 12 hours 08/28/16 1051     08/25/16 2300  vancomycin (VANCOCIN) 1,500 mg in sodium chloride 0.9 % 500 mL IVPB  Status:  Discontinued     1,500 mg 250 mL/hr over 120 Minutes Intravenous Every 12 hours 08/25/16 2247 08/28/16 1050   08/25/16 2300  piperacillin-tazobactam (ZOSYN) IVPB 3.375 g  Status:  Discontinued     3.375 g 12.5 mL/hr over 240 Minutes Intravenous Every 8 hours 08/25/16 2247 08/28/16 1051   08/25/16 1530  ceFAZolin (ANCEF) IVPB 1 g/50 mL premix  Status:  Discontinued     1 g 100 mL/hr over 30 Minutes  Intravenous  Once 08/25/16 1524 08/25/16 2252   08/25/16 1245  ceFAZolin (ANCEF) IVPB 1 g/50 mL premix     1 g 100 mL/hr over 30 Minutes Intravenous  Once 08/25/16 1234 08/25/16 1348        Subjective: No acute complaints, continues to have a right foot pain.  Objective: Physical Exam: Vitals:   08/27/16 1914 08/27/16 2016 08/28/16 0433 08/28/16 1513  BP: 106/72 120/83 116/73 118/77  Pulse: (!) 42 (!) 45 (!) 48 (!) 56  Resp: 13 14 14 16   Temp:  98 F (36.7 C) 98 F (36.7 C) 98.4 F (36.9 C)  TempSrc:  Oral Oral   SpO2: 100% 100% 100% 100%  Weight:      Height:        Intake/Output Summary (Last 24 hours) at 08/28/16 1825 Last data filed at 08/28/16 0200  Gross per 24 hour  Intake             1140 ml  Output              355 ml  Net              785 ml   Filed Weights   08/25/16 2035  Weight: 81.6 kg (180 lb)    General: Alert, Awake and Oriented to Time, Place and Person. Appear in mild distress, affect appropriate Eyes: PERRL, Conjunctiva normal ENT: Oral Mucosa clear moist. Neck: no JVD, no Abnormal Mass Or lumps Cardiovascular: S1 and S2 Present, no Murmur, Respiratory:  Bilateral Air entry equal and Decreased, no use of accessory muscle, Clear to Auscultation, no Crackles, no wheezes Abdomen: Bowel Sound present, Soft and no tenderness Skin: no redness, no Rash, no induration Extremities: trace Pedal edema, no calf tenderness Neurologic: Grossly no focal neuro deficit. Bilaterally Equal motor strength  Data Reviewed: CBC:  Recent Labs Lab 08/25/16 1318 08/27/16 0334 08/28/16 0530  WBC 10.0 5.0 5.7  HGB 12.0* 10.9* 11.2*  HCT 37.5* 34.2* 34.5*  MCV 93.8 92.7 91.8  PLT 218 213 [id number]   Basic Metabolic Panel:  Recent Labs Lab 08/25/16 1318 08/27/16 0334 08/28/16 0530  NA 137 139 136  K 3.7 4.0 3.8  CL 101 106 104  CO2 27 27 26   GLUCOSE 92 91 82  BUN 9 8 6   CREATININE 0.91 0.98 1.02  CALCIUM 9.2 8.6* 8.4*   Studies: No results found.    Scheduled Meds: . amoxicillin-clavulanate  1 tablet Oral Q12H  . doxycycline  100 mg Oral Q12H  . ketorolac  15 mg Intravenous Q8H  . methocarbamol  500 mg Oral TID   Continuous Infusions: . sodium chloride 100 mL/hr at 08/28/16 0516   PRN Meds: acetaminophen, diphenhydrAMINE, HYDROcodone-acetaminophen, morphine injection, ondansetron **OR** ondansetron (ZOFRAN) IV  Time spent: 30 minutes  Author: Berle Mull, MD [facility] Pager: [phone] 08/28/2016 6:25 PM  If 7PM-7AM, please contact night-coverage at [email or web address], password [facility]

## 2016-08-28 NOTE — Progress Notes (Signed)
Pharmacy Antibiotic Note  Chris Fox is a 46 y.o. male admitted on 08/25/2016 with R foot infection. For I&D in [place]. Pharmacy has been consulted for Vancomycin dosing. Pt also on Zosyn per MD. Antibiotics day #4. S/p I&D on 1/28, 1/30. Afebrile, WBC WNL. Renal function stable CrCl~88.  Plan: Vancomycin 1500mg  IV q12h Zosyn 3.375g IV q8h (4h infusion) Will f/u micro data, renal function, and pt's clinical condition Vanc trough soon if continuing Per MD - keep abx until deep abscess cx returns   Height: 5\' 8"  (172.7 cm) Weight: 180 lb (81.6 kg) IBW/kg (Calculated) : 68.4  Temp (24hrs), Avg:97.9 F (36.6 C), Min:97.4 F (36.3 C), Max:98.3 F (36.8 C)   Recent Labs Lab 08/25/16 1318 08/27/16 0334 08/28/16 0530  WBC 10.0 5.0 5.7  CREATININE 0.91 0.98 1.02    Estimated Creatinine Clearance: 87.5 mL/min (by C-G formula based on SCr of 1.02 mg/dL).    Allergies  Allergen Reactions  . Colchicine Other (See Comments)    GI pain    Antimicrobials this admission: 1/28 Zosyn >>  1/28 Vanc >>   Dose adjustments this admission: n/a  Microbiology results: 1/28 BCx x2: ngtd 1/28 R foot abscess (superficial): rare GBS; deep wound cx pending 1/28 mrsa pcr: neg   Elicia Lamp, PharmD, BCPS Clinical Pharmacist 08/28/2016 10:08 AM

## 2016-08-29 ENCOUNTER — Telehealth: Payer: Self-pay | Admitting: *Deleted

## 2016-08-29 LAB — CBC
HCT: 36.6 % — ABNORMAL LOW (ref 39.0–52.0)
Hemoglobin: 12 g/dL — ABNORMAL LOW (ref 13.0–17.0)
MCH: 30.2 pg (ref 26.0–34.0)
MCHC: 32.8 g/dL (ref 30.0–36.0)
MCV: 92.2 fL (ref 78.0–100.0)
PLATELETS: 229 10*3/uL (ref 150–400)
RBC: 3.97 MIL/uL — AB (ref 4.22–5.81)
RDW: 11.8 % (ref 11.5–15.5)
WBC: 5.5 10*3/uL (ref 4.0–10.5)

## 2016-08-29 LAB — BASIC METABOLIC PANEL
ANION GAP: 8 (ref 5–15)
BUN: 9 mg/dL (ref 6–20)
CALCIUM: 8.9 mg/dL (ref 8.9–10.3)
CHLORIDE: 107 mmol/L (ref 101–111)
CO2: 24 mmol/L (ref 22–32)
CREATININE: 0.9 mg/dL (ref 0.61–1.24)
GFR calc Af Amer: >60 mL/min (ref >60)
Glucose, Bld: 83 mg/dL (ref 65–99)
POTASSIUM: 4.2 mmol/L (ref 3.5–5.1)
Sodium: 139 mmol/L (ref 135–145)

## 2016-08-29 MED ORDER — AMOXICILLIN-POT CLAVULANATE 875-125 MG PO TABS: 1.0000 | ORAL_TABLET | Freq: Two times a day (BID) | ORAL | 0 refills | Status: AC

## 2016-08-29 MED ORDER — HYDROCODONE-ACETAMINOPHEN 5-325 MG PO TABS: 1.0000 | ORAL_TABLET | Freq: Four times a day (QID) | ORAL | 0 refills | Status: DC | PRN

## 2016-08-29 MED ORDER — METHOCARBAMOL 500 MG PO TABS: 500.0000 mg | ORAL_TABLET | Freq: Three times a day (TID) | ORAL | 0 refills | Status: DC

## 2016-08-29 MED ORDER — DOXYCYCLINE HYCLATE 100 MG PO TABS: 100.0000 mg | ORAL_TABLET | Freq: Two times a day (BID) | ORAL | 0 refills | Status: DC

## 2016-08-29 NOTE — Progress Notes (Signed)
Orthopedic Tech Progress Note Patient Details:  Chris Fox 1970/12/18 [id number]  Ortho Devices Type of Ortho Device: Postop shoe/boot Ortho Device/Splint Interventions: Application   [name] 08/29/2016, 11:50 AM

## 2016-08-29 NOTE — Telephone Encounter (Signed)
Let me know if I need to go see them or anyone in the hospital

## 2016-08-29 NOTE — Brief Op Note (Signed)
08/25/2016 - 08/27/2016  11:21 AM  PATIENT:  Chris Fox  46 y.o. male  PRE-OPERATIVE DIAGNOSIS:  Infection right foot  POST-OPERATIVE DIAGNOSIS:  Infection right foot  PROCEDURE:  Procedure(s): IRRIGATION AND DEBRIDEMENT RIGHT FOOT (Right)  SURGEON:  Surgeon(s) and Role:    * Edrick Kins, DPM - Primary  PHYSICIAN ASSISTANT:   ASSISTANTS: none   ANESTHESIA:   local  EBL:  No intake/output data recorded.  BLOOD ADMINISTERED:none  DRAINS: none   LOCAL MEDICATIONS USED:  BUPIVICAINE  and LIDOCAINE   SPECIMEN:  No Specimen  DISPOSITION OF SPECIMEN:  N/A  COUNTS:  YES  TOURNIQUET:   Total Tourniquet Time Documented: Thigh (Right) - 10 minutes Total: Thigh (Right) - 10 minutes   DICTATION: .Viviann Spare Dictation  PLAN OF CARE: Admit to inpatient   PATIENT DISPOSITION:  PACU - hemodynamically stable.   Delay start of Pharmacological VTE agent (>24hrs) due to surgical blood loss or risk of bleeding: not applicable

## 2016-08-29 NOTE — Telephone Encounter (Addendum)
Dr. Posey Pronto states he is looking for information for discharging surgical pt - weight bearing status, antibiotic coverage for 10 days and follow up. I told Dr. Posey Pronto, that Dr. Amalia Hailey was out of the office, but after reviewing pt's clinicals, weight bearing mostly on non-surgical foot and use right heel for transferring and only weight bearing for no more than 15 minutes/hour until seen, and his orders for antibiotic and have pt call for follow up as soon as he gets home. Dr. Posey Pronto states he agrees and will get pt ready to leave. 09/20/2016-Pt left name, DOB and phone number. I left message to call again.

## 2016-08-29 NOTE — Telephone Encounter (Signed)
Chris Fox,  Thank you! I agree with everything. If you could just make sure the patient gets in to see me early next week.  Thanks again [place]! Dr. Amalia Hailey

## 2016-08-29 NOTE — Op Note (Signed)
08/25/2016 - 08/27/2016  11:21 AM  PATIENT:  Chris Fox  46 y.o. male  PRE-OPERATIVE DIAGNOSIS:  Infection with abscess right foot  POST-OPERATIVE DIAGNOSIS:  infection with abscess right foot  PROCEDURE:  Procedure(s): Incision and drainage right foot  SURGEON:  Surgeon(s) and Role:    * Edrick Kins, DPM - Primary  PHYSICIAN ASSISTANT:   ASSISTANTS: none   ANESTHESIA:  MAC + local 50-50 mixture of 2% lidocaine plain and 0.5% Marcaine plain totaling 20cc infiltrated in the patient's right ankle  EBL:  66m  BLOOD ADMINISTERED:none  DRAINS: none   LOCAL MEDICATIONS USED:  BUPIVICAINE  and LIDOCAINE   SPECIMEN:  No Specimen  DISPOSITION OF SPECIMEN:  N/A  COUNTS:  YES  TOURNIQUET:   Total Tourniquet Time Documented: Thigh (Right) - 10 minutes Total: Thigh (Right) - 10 minutes  JUSTIFICATION FOR PROCEDURE: 46[facility] with abscess to the right foot. Patient originally presented to the emergency department for evaluation of cellulitis on 08/25/2016 at which time he was admitted for IV antibiotics and surgical debridement. The patient was told benefits as well as possible side effects of the procedure. The patient consented for surgical correction. Patient consent form was reviewed. All patient questions were answered. No guarantees were expressed or implied. Patient and the surgeon boson the patient consent form with the legs present placed in the patient's chart.  PROCEDURE IN DETAIL: The patient was brought to the operating room placed the operating table in a supine position at which time an aseptic scrub and drape were on the patient's right lower extremity after anesthesia was induced as above. The right ankle tourniquet was inflated to a pressure of 1050 mg mercury and attention was directed to the dorsal aspect of the right foot where procedure #1 commenced.  PROCEDURE  #1: INCISION AND DRAINAGE RIGHT FOOT An open wound was are noted to the dorsal aspect of the right forefoot between digits 2 and 3. This open wound was due to a previous incision and drainage performed at bedside. Careful blunt dissection was performed to the open wound and extended into the deep tissue abscesses using a curved mosquito. All aspects of the compartments of the foot were penetrated to alleviate any deep tissue abscess formation. Expression of the foot performed and all purulent drainage was expressed from open drainage site. At this time deep tissue cultures were taken and sent to pathology for cultures and sensitivity. 3 L of normal saline with pulse lavage was utilized for copious irrigation of the incision site. After copious irrigation the foot was dried in preparation for dry sterile dressing. Quarter inch iodoform gauze packing was utilized to pack the deep wound followed by dry sterile dressings. The right ankle tourniquet which was used for hemostasis was deflated. All normal neurovascular responses including pink color and warmth returned all the digits of the patient's right lower extremity. Patient was then transferred from the operating room to the recovery room having tolerated the procedure and anesthesia well. All vital signs are stable. After a brief stay in the recovery room the patient was readmitted to his room with prescriptions for adequate analgesic and anti-inflammatory care for in hospital use.   From a surgical standpoint, the patient can be discharged pending appropriate antibiotic therapy based on culture results. The patient to be weightbearing in a postoperative shoe. The patient is to keep the dressings clean dry and intact until he is to follow  surgeon Dr. Daylene Katayama in the office next week.   PLAN OF CARE: Admit to inpatient   PATIENT DISPOSITION:  PACU - hemodynamically stable.   Delay start of Pharmacological VTE agent (>24hrs) due to surgical blood  loss or risk of bleeding: not applicable

## 2016-08-30 LAB — CULTURE, BLOOD (ROUTINE X 2)
Culture: NO GROWTH
Culture: NO GROWTH

## 2016-08-30 NOTE — Discharge Summary (Addendum)
[facility] Discharge Summary   Patient: Chris Fox [id number]   PCP: No PCP Per Patient DOB: [birth year]   Date of admission: 08/25/2016   Date of discharge: 08/29/2016    Discharge Diagnoses:  Principal Problem:   Foot abscess Active Problems:   Gastroesophageal reflux disease   Gout   Admitted From: home Disposition:  home  Recommendations for Outpatient Follow-up:  1. Follow-up with PCP in one week. 2. Follow-up with podiatry in one week  Follow-up [place], DPM. Schedule an appointment as soon as possible for a visit in 1 week(s).   Specialty:  Podiatry Why:  for wound follow up. ideally monday.  Contact information: [place] [address] [phone]          Diet recommendation: Regular diet  Activity: The patient is advised to gradually reintroduce usual activities.  Discharge Condition: good  Code Status: Full code  History of present illness: As per the H and P dictated on admission, "Chris Fox is a 46 y.o. male with medical history significant of pericarditis diagnosed in 2013 (off and echocardiogram at that time were normal) , GERD, headache and get around the emergency department with complaints of right foot pain and swelling for 5 days. She reported that symptoms started with the base of the toe and in swelling and redness progressed proximally to her that there is a month for. He was treated with oral clindamycin and Keflex and foot pain with taking Advil, but did not get it improvement in symptoms. Since last night symptoms became progressively worse and he presented to the emergency room for evaluation."  Hospital Course:   Summary of his active problems in the hospital is as following. 1. foot abscess with cellulitis Podiatry consulted, Underwent I and D on 08/27/2016 Monitor recommendation for podiatry. Initially on vancomycin and Zosyn. Change to Augmentin with doxycycline. Deep cultures  negative so far, final report currently pending. Pt will follow up with podiatry as outpatient. [place] weight bearing on surgical foot with surgical boot.   2. GERD. Continue PPI.  All other chronic medical condition were stable during the hospitalization.  Patient was ambulatory without any assistance. On the day of the discharge the patient's vitals were stable, and no other acute medical condition were reported by patient. the patient was felt safe to be discharge at home with family.  Procedures and Results:  Incision and drainage of the abscess   Consultations:  Podiatry  DISCHARGE MEDICATION: Discharge Medication List as of 08/29/2016 11:33 AM    START taking these medications   Details  amoxicillin-clavulanate (AUGMENTIN) 875-125 MG tablet Take 1 tablet by mouth every 12 (twelve) hours., Starting Thu 08/29/2016, Until Wed 09/04/2016, Normal    doxycycline (VIBRA-TABS) 100 MG tablet Take 1 tablet (100 mg total) by mouth every 12 (twelve) hours., Starting Thu 08/29/2016, Until Wed 09/04/2016, Normal      CONTINUE these medications which have CHANGED   Details  HYDROcodone-acetaminophen (NORCO) 5-325 MG tablet Take 1-2 tablets by mouth every 6 (six) hours as needed for moderate pain., Starting Thu 08/29/2016, Print    methocarbamol (ROBAXIN) 500 MG tablet Take 1 tablet (500 mg total) by mouth 3 (three) times daily., Starting Thu 08/29/2016, Print      CONTINUE these medications which have NOT CHANGED   Details  acetaminophen (TYLENOL) 500 MG tablet Take 2 tablets (1,000 mg total) by mouth every 6 (six) hours as needed for mild pain., Starting Thu  06/13/2016, Print    clotrimazole (LOTRIMIN) 1 % cream Apply in between all toes of right foot cdaily., Print    ibuprofen (ADVIL,MOTRIN) 800 MG tablet Take 1 tablet (800 mg total) by mouth every 8 (eight) hours as needed., Starting Wed 02/21/2016, Print    naproxen sodium (ALEVE) 220 MG tablet Take 440 mg by mouth 2 (two) times daily as  needed (pain)., Historical Med      STOP taking these medications     clindamycin (CLEOCIN) 150 MG capsule        Allergies  Allergen Reactions  . Colchicine Other (See Comments)    GI pain   Discharge Instructions    Diet general    Complete by:  As directed    Discharge instructions    Complete by:  As directed    It is important that you read following instructions as well as go over your medication list with RN to help you understand your care after this hospitalization.  Discharge Instructions: Please follow-up with PCP in one week  Please request your primary care physician to go over all Hospital Tests and Procedure/Radiological results at the follow up,  Please get all Hospital records sent to your PCP by signing hospital release before you go home.   Do not drive, operating heavy machinery, perform activities at heights, swimming or participation in water activities or provide baby sitting services while your are on Pain, Sleep and Anxiety Medications; until you have been seen by Primary Care Physician or a Neurologist and advised to do so again. Do not take more than prescribed Pain, Sleep and Anxiety Medications. You were cared for by a hospitalist during your hospital stay. If you have any questions about your discharge medications or the care you received while you were in the hospital after you are discharged, you can call the unit and ask to speak with the hospitalist on call if the hospitalist that took care of you is not available.  Once you are discharged, your primary care physician will handle any further medical issues. Please note that NO REFILLS for any discharge medications will be authorized once you are discharged, as it is imperative that you return to your primary care physician (or establish a relationship with a primary care physician if you do not have one) for your aftercare needs so that they can reassess your need for medications and monitor your lab  values. You Must read complete instructions/literature along with all the possible adverse reactions/side effects for all the Medicines you take and that have been prescribed to you. Take any new Medicines after you have completely understood and accept all the possible adverse reactions/side effects. Wear Seat belts while driving. If you have smoked or chewed Tobacco in the last 2 yrs please stop smoking and/or stop any Recreational drug use.   Increase activity slowly    Complete by:  As directed    Leave dressing on - Keep it clean, dry, and intact until clinic visit    Complete by:  As directed    Touch down weight bearing    Complete by:  As directed    Laterality:  right   Extremity:  Lower   Do not place stand on the right foot with full weight. Do not stand for more then 15 minutes in an hour Use crutches to ambulate.     Discharge Exam: Filed Weights   08/25/16 2035  Weight: 81.6 kg (180 lb)   Vitals:   08/29/16  0800 08/29/16 1158  BP: 114/79 (!) 141/81  Pulse: (!) 55 (!) 55  Resp: 18   Temp: 97.5 F (36.4 C) 97 F (36.1 C)   General: Appear in no distress, no Rash; Oral Mucosa moist. Cardiovascular: S1 and S2 Present, no Murmur, no JVD Respiratory: Bilateral Air entry present and Clear to Auscultation, no Crackles, no wheezes Abdomen: Bowel Sound present, Soft and no tenderness Extremities: no Pedal edema, no calf tenderness Neurology: Grossly no focal neuro deficit.  The results of significant diagnostics from this hospitalization (including imaging, microbiology, ancillary and laboratory) are listed below for reference.    Significant Diagnostic Studies: Dg Foot 2 Views Right  Result Date: 08/25/2016 CLINICAL DATA:  Patient with possible abscess along the dorsal aspect of the foot. EXAM: RIGHT FOOT - 2 VIEW COMPARISON:  None. FINDINGS: Along the dorsal aspect of the foot anteriorly there is focal skin thickening with central lucency which likely represents a  small amount of associated gas. Soft tissue thickening/ edema about the midfoot. Midfoot degenerative changes. No evidence for osseous cortical destruction. IMPRESSION: Focal skin thickening of the dorsal aspect of the forefoot with central lucency which may represent abscess and central gas. Electronically Signed   By: Lovey Newcomer M.D.   On: 08/25/2016 13:20    Microbiology: Recent Results (from the past 240 hour(s))  Blood culture (routine x 2)     Status: None (Preliminary result)   Collection Time: 08/25/16  1:18 PM  Result Value Ref Range Status   Specimen Description BLOOD RIGHT ANTECUBITAL  Final   Special Requests BOTTLES DRAWN AEROBIC AND ANAEROBIC 5CC  Final   Culture NO GROWTH 4 DAYS  Final   Report Status PENDING  Incomplete  Blood culture (routine x 2)     Status: None (Preliminary result)   Collection Time: 08/25/16  1:38 PM  Result Value Ref Range Status   Specimen Description BLOOD LEFT ANTECUBITAL  Final   Special Requests BOTTLES DRAWN AEROBIC AND ANAEROBIC 5CC  Final   Culture NO GROWTH 4 DAYS  Final   Report Status PENDING  Incomplete  Surgical pcr screen     Status: None   Collection Time: 08/25/16  8:21 PM  Result Value Ref Range Status   MRSA, PCR NEGATIVE NEGATIVE Final   Staphylococcus aureus NEGATIVE NEGATIVE Final    Comment:        The Xpert SA Assay (FDA approved for NASAL specimens in patients over 40 years of age), is one component of a comprehensive surveillance program.  Test performance has been validated by [facility] for patients greater than or equal to 46 year old. It is not intended to diagnose infection nor to guide or monitor treatment.   Aerobic Culture (superficial specimen)     Status: None   Collection Time: 08/25/16 11:08 PM  Result Value Ref Range Status   Specimen Description ABSCESS RIGHT FOOT  Final   Special Requests NONE  Final   Gram Stain   Final    NO WBC SEEN NO SQUAMOUS EPITHELIAL CELLS SEEN FEW GRAM POSITIVE  COCCI IN PAIRS    Culture   Final    RARE GROUP B STREP(S.AGALACTIAE)ISOLATED TESTING AGAINST S. AGALACTIAE NOT ROUTINELY PERFORMED DUE TO PREDICTABILITY OF AMP/PEN/VAN SUSCEPTIBILITY.    Report Status 08/28/2016 FINAL  Final  Aerobic/Anaerobic Culture (surgical/deep wound)     Status: None (Preliminary result)   Collection Time: 08/27/16  5:34 PM  Result Value Ref Range Status   Specimen Description ABSCESS RIGHT  FOOT  Final   Special Requests NONE  Final   Gram Stain   Final    FEW WBC PRESENT,BOTH PMN AND MONONUCLEAR NO ORGANISMS SEEN    Culture NO GROWTH 2 DAYS  Final   Report Status PENDING  Incomplete     Labs: CBC:  Recent Labs Lab 08/25/16 1318 08/27/16 0334 08/28/16 0530 08/29/16 0332  WBC 10.0 5.0 5.7 5.5  HGB 12.0* 10.9* 11.2* 12.0*  HCT 37.5* 34.2* 34.5* 36.6*  MCV 93.8 92.7 91.8 92.2  PLT 218 213 222 [id number]   Basic Metabolic Panel:  Recent Labs Lab 08/25/16 1318 08/27/16 0334 08/28/16 0530 08/29/16 0332  NA 137 139 136 139  K 3.7 4.0 3.8 4.2  CL 101 106 104 107  CO2 27 27 26 24   GLUCOSE 92 91 82 83  BUN 9 8 6 9   CREATININE 0.91 0.98 1.02 0.90  CALCIUM 9.2 8.6* 8.4* 8.9   Time spent: 35 minutes  Signed:  PATEL, PRANAV  [facility] 08/29/2016 , 2:30 PM

## 2016-09-01 LAB — AEROBIC/ANAEROBIC CULTURE (SURGICAL/DEEP WOUND): CULTURE: NO GROWTH

## 2016-09-01 LAB — AEROBIC/ANAEROBIC CULTURE W GRAM STAIN (SURGICAL/DEEP WOUND)

## 2016-09-02 ENCOUNTER — Other Ambulatory Visit: Payer: Self-pay | Admitting: *Deleted

## 2016-09-03 ENCOUNTER — Ambulatory Visit: Payer: BLUE CROSS/BLUE SHIELD | Admitting: Sports Medicine

## 2016-09-04 ENCOUNTER — Encounter: Payer: Self-pay | Admitting: Podiatry

## 2016-09-04 ENCOUNTER — Ambulatory Visit (INDEPENDENT_AMBULATORY_CARE_PROVIDER_SITE_OTHER): Payer: BLUE CROSS/BLUE SHIELD | Admitting: Podiatry

## 2016-09-04 VITALS — BP 123/77 | HR 75 | Temp 99.4°F

## 2016-09-04 DIAGNOSIS — Z4889 Encounter for other specified surgical aftercare: Secondary | ICD-10-CM

## 2016-09-04 DIAGNOSIS — Z9889 Other specified postprocedural states: Secondary | ICD-10-CM

## 2016-09-04 MED ORDER — DOXYCYCLINE HYCLATE 100 MG PO TABS: 100.0000 mg | ORAL_TABLET | Freq: Two times a day (BID) | ORAL | 0 refills | Status: DC

## 2016-09-04 MED ORDER — HYDROCODONE-ACETAMINOPHEN 5-325 MG PO TABS: 1.0000 | ORAL_TABLET | Freq: Four times a day (QID) | ORAL | 0 refills | Status: DC | PRN

## 2016-09-11 ENCOUNTER — Ambulatory Visit (INDEPENDENT_AMBULATORY_CARE_PROVIDER_SITE_OTHER): Payer: BLUE CROSS/BLUE SHIELD | Admitting: Podiatry

## 2016-09-11 ENCOUNTER — Encounter: Payer: Self-pay | Admitting: Podiatry

## 2016-09-11 DIAGNOSIS — Z9889 Other specified postprocedural states: Secondary | ICD-10-CM

## 2016-09-11 NOTE — Progress Notes (Signed)
subjective: Patient presents today status post incision and drainage    Subjective: Patient presents today status post incision and drainage of the right foot. Date of surgery 08/27/2016. Patient states that he is feeling much better in the wound appears much better. Patient denies significant pain  Objective: Open ulceration site noted to the dorsum of the right foot between the digits 2 and 3. Wound base appears healthy and granular with signs of healing. Wound measures approximately [phone] cm (LxWxD).   Assessment: Status post incision and drainage. Date of surgery 08/27/2016. Doing well. No sign of infection.  Plan of care: Today dressings were changed. Collagen dressing applied. Toe protector was dispensed for the patient to apply to his postoperative shoe for possible return to work. Return to clinic in 1 week.  Patient will likely be a little to return to work in 1 week  Edrick Kins, DPM [facility]  Dr. Edrick Kins, [address]                                        [place], [place] [address]                Office [phone]  Fax [phone]

## 2016-09-16 NOTE — Progress Notes (Signed)
Subjective: Patient presents today status post incision and drainage to the right foot. Patient has recently been admitted to the hospital for cellulitis with abscess to the right foot. Surgical incision and drainage was performed on 08/27/2016. Patient states that is feeling much better.  Objective: Open wound noted from the incision and drainage site to the right dorsal forefoot. Open wound measures approximately 1.01.00.6cm (LxWxD).  To the noted ulceration there is a moderate amount of slough fibrin and necrotic tissue noted within the ulceration site with moderate serosanguineous drainage noted. Periwound integrity appears to be intact. The cellulitis of the right foot appears to be resolved.  Assessment: Status post incision and drainage of abscess with cellulitis to the right foot. Date of surgery 08/27/2016  Plan of care: Today a postoperative shoe was dispensed. Prescription for doxycycline 100 mg Return to clinic in 1 week

## 2016-09-18 ENCOUNTER — Encounter: Payer: Self-pay | Admitting: Podiatry

## 2016-09-18 ENCOUNTER — Ambulatory Visit (INDEPENDENT_AMBULATORY_CARE_PROVIDER_SITE_OTHER): Payer: BLUE CROSS/BLUE SHIELD | Admitting: Podiatry

## 2016-09-18 DIAGNOSIS — Z9889 Other specified postprocedural states: Secondary | ICD-10-CM

## 2016-09-18 DIAGNOSIS — L97512 Non-pressure chronic ulcer of other part of right foot with fat layer exposed: Secondary | ICD-10-CM

## 2016-09-18 MED ORDER — MUPIROCIN CALCIUM 2 % EX CREA: 1.0000 "application " | TOPICAL_CREAM | Freq: Two times a day (BID) | CUTANEOUS | 1 refills | Status: DC

## 2016-09-18 NOTE — Progress Notes (Signed)
subjective: Patient presents today status post incision and drainage    Subjective: Patient presents today status post incision and drainage of the right foot. Date of surgery 08/27/2016. Patient states that he is feeling much better in the wound appears much better. Patient denies significant pain  Objective: Open ulceration site noted to the dorsum of the right foot between the digits 2 and 3. Wound base appears healthy and granular with signs of healing. Wound measures approximately 004.004.004.004 cm (LxWxD).   Assessment: Status post incision and drainage. Date of surgery 08/27/2016. Doing well. No sign of infection. Routine healing noted  Plan of care: Today dressings were changed. Medically necessary excisional debridement including subcutaneous tissue was performed to the ulceration site. Excisional debridement of all necrotic nonviable tissue down to healthy bleeding viable tissue was performed with post-debridement measurements and was pre-. Prescription for mupirocin 2% cream. Patient is able to return to work today full duty no restrictions. Return to clinic in 2 weeks.   Edrick Kins, DPM [facility]  Dr. Edrick Kins, [address]                                        [place], [place] [address]                Office [phone]  Fax [phone]

## 2016-09-23 ENCOUNTER — Encounter: Payer: Self-pay | Admitting: Podiatry

## 2016-10-02 ENCOUNTER — Ambulatory Visit (INDEPENDENT_AMBULATORY_CARE_PROVIDER_SITE_OTHER): Payer: BLUE CROSS/BLUE SHIELD | Admitting: Podiatry

## 2016-10-02 DIAGNOSIS — Z9889 Other specified postprocedural states: Secondary | ICD-10-CM

## 2016-10-02 DIAGNOSIS — Z4889 Encounter for other specified surgical aftercare: Secondary | ICD-10-CM

## 2016-10-08 NOTE — Progress Notes (Signed)
Subjective: Patient presents today for follow-up evaluation of an incision and drainage performed of the right foot. Date of surgery 08/27/2016. Patient states that he feels much better and is doing fantastic.  Objective: Open ulceration site noted to the dorsum of the right foot has healed. Complete reepithelialization has occurred. No sign of infectious process noted.  Assessment: Status post incision and drainage. Date of surgery 08/27/2016.-Healed.  Plan of care: Today the patient can return to work. Full duty no restrictions. No Court provided. Return to clinic when necessary.

## 2017-05-12 ENCOUNTER — Encounter (HOSPITAL_COMMUNITY): Payer: Self-pay | Admitting: *Deleted

## 2017-05-12 ENCOUNTER — Emergency Department (HOSPITAL_COMMUNITY)
Admission: EM | Admit: 2017-05-12 | Discharge: 2017-05-12 | Disposition: A | Discharge status: Home / Self Care | Payer: BLUE CROSS/BLUE SHIELD | Attending: Emergency Medicine | Admitting: Emergency Medicine

## 2017-05-12 ENCOUNTER — Emergency Department (HOSPITAL_COMMUNITY): Payer: BLUE CROSS/BLUE SHIELD

## 2017-05-12 DIAGNOSIS — Z87891 Personal history of nicotine dependence: Secondary | ICD-10-CM | POA: Diagnosis not present

## 2017-05-12 DIAGNOSIS — M170 Bilateral primary osteoarthritis of knee: Secondary | ICD-10-CM | POA: Diagnosis not present

## 2017-05-12 DIAGNOSIS — Z79899 Other long term (current) drug therapy: Secondary | ICD-10-CM | POA: Diagnosis not present

## 2017-05-12 DIAGNOSIS — M25462 Effusion, left knee: Secondary | ICD-10-CM | POA: Diagnosis not present

## 2017-05-12 DIAGNOSIS — M25569 Pain in unspecified knee: Secondary | ICD-10-CM | POA: Diagnosis present

## 2017-05-12 MED ORDER — MELOXICAM 15 MG PO TABS: 7.5000 mg | ORAL_TABLET | Freq: Every day | ORAL | 0 refills | Status: DC

## 2017-05-12 NOTE — Discharge Instructions (Signed)
Take 1 tablet of meloxicam daily with food. Do not take ibuprofen/motrin/advil or naproxen while taking this medication.   Continue wearing your knee braces at home for comfort. Ice or heat may be applied for 15-20 minutes up to 3-4 times per day to help with pain and swelling. Please keep your knees elevated above the level of your heart when you are resting.   Please call and schedule a follow up appointment with [place].

## 2017-05-12 NOTE — ED Notes (Signed)
Pt verbalized understanding of d/c instructions and has no further questions. VSS, NAD. Removed all belongings from room.

## 2017-05-12 NOTE — ED Triage Notes (Signed)
Pt in c/o L & R knee pain and Swelling, worse to the L knee, onset 3-4 mths, pt reports taking Ibuprofen for pain, pt ambulatory, A&O x4

## 2017-05-12 NOTE — ED Provider Notes (Signed)
[facility] EMERGENCY DEPARTMENT Provider Note   CSN: [id number] Arrival date & time: 05/12/17  1515     History   Chief Complaint Chief Complaint  Patient presents with  . Knee Pain    HPI Chris Fox is a 46 y.o. male who presents to the emergency department with a chief complaint of bilateral knee pain that began 5 months ago without trauma or injury, but has worsened over the last few weeks. He describes the pain as sharp, non-radiating pain over the anteromedial aspect of both knees. He also complains of bilateral knee swelling that worsens throughout the day. He denies fever, chills, or warmth and redness to the bilateral knees. No bilateral ankle or knee pain.   The history is provided by the patient. No language interpreter was used.    Past Medical History:  Diagnosis Date  . Abdominal pain    a. LLQ 11/2011  . GERD (gastroesophageal reflux disease)   . Gout   . Headache(784.0)   . Midsternal chest pain    a. Cardiac cath 12/10/11 with normal coronaries and LV systolic function;  08/3760 Echo EF 55-60%, no valvular abnormalities or pericardial effusion  . Pericarditis    a. presumed - 11/2011  . ST elevation    a. Anterior ST elevation ? early repolarization vs pericarditis    Patient Active Problem List   Diagnosis Date Noted  . Foot abscess 08/25/2016  . Gastroesophageal reflux disease 08/25/2016  . Gout 08/25/2016  . Midsternal chest pain   . ST elevation   . Abdominal pain   . Chest pain 12/10/2011  . Bradycardia 12/10/2011    Past Surgical History:  Procedure Laterality Date  . CARDIAC CATHETERIZATION  12/10/11   normal coronaries and LV systolic function;  Echo with normal LV function, EF 55-60%, no valvular abnormalities or pericardial effusion  . I&D EXTREMITY Right 08/27/2016   Procedure: IRRIGATION AND DEBRIDEMENT RIGHT FOOT;  Surgeon: Edrick Kins, DPM;  Location: [facility];  Service: Podiatry;  Laterality: Right;  . LEFT HEART  CATHETERIZATION WITH CORONARY ANGIOGRAM N/A 12/10/2011   Procedure: LEFT HEART CATHETERIZATION WITH CORONARY ANGIOGRAM;  Surgeon: Larey Dresser, MD;  Location: [facility] CATH LAB;  Service: Cardiovascular;  Laterality: N/A;       Home Medications    Prior to Admission medications   Medication Sig Start Date End Date Taking? Authorizing Provider  acetaminophen (TYLENOL) 500 MG tablet Take 2 tablets (1,000 mg total) by mouth every 6 (six) hours as needed for mild pain. Patient not taking: Reported on 08/25/2016 06/13/16   Leo Grosser, MD  clindamycin (CLEOCIN) 150 MG capsule TAKE 2 CAPSULES 3 TIMES DAILY 08/21/16   [provider]  clotrimazole (LOTRIMIN) 1 % cream Apply in between all toes of right foot cdaily. Patient taking differently: Apply 1 application topically See admin instructions. Apply in between all toes of right foot daily. 08/21/16   Larene Pickett, PA-C  doxycycline (VIBRA-TABS) 100 MG tablet Take 1 tablet (100 mg total) by mouth 2 (two) times daily. 09/04/16   Edrick Kins, DPM  HYDROcodone-acetaminophen (NORCO) 5-325 MG tablet Take 1-2 tablets by mouth every 6 (six) hours as needed for moderate pain. 08/29/16   Lavina Hamman, MD  HYDROcodone-acetaminophen (NORCO/VICODIN) 5-325 MG tablet Take 1 tablet by mouth every 6 (six) hours as needed for moderate pain. 09/04/16   Edrick Kins, DPM  ibuprofen (ADVIL,MOTRIN) 800 MG tablet Take 1 tablet (800 mg total) by mouth  every 8 (eight) hours as needed. Patient not taking: Reported on 08/25/2016 02/21/16   Dalia Heading, PA-C  meloxicam (MOBIC) 15 MG tablet Take 0.5 tablets (7.5 mg total) by mouth daily. 05/12/17   Joas Motton A, PA-C  methocarbamol (ROBAXIN) 500 MG tablet Take 1 tablet (500 mg total) by mouth 3 (three) times daily. 08/29/16   Lavina Hamman, MD  mupirocin cream (BACTROBAN) 2 % Apply 1 application topically 2 (two) times daily. 09/18/16   Edrick Kins, DPM  naproxen sodium (ALEVE) 220 MG tablet Take 440 mg by  mouth 2 (two) times daily as needed (pain).    [provider]    Family History Family History  Problem Relation Age of Onset  . Diabetes Maternal Grandmother     Social History Social History  Substance Use Topics  . Smoking status: Former Smoker    Packs/day: 1.50    Years: 21.00    Types: Cigarettes    Quit date: 09/11/2002  . Smokeless tobacco: Never Used  . Alcohol use No     Allergies   Colchicine   Review of Systems Review of Systems  Musculoskeletal: Positive for arthralgias, joint swelling and myalgias. Negative for back pain and gait problem.  Skin: Negative for color change and wound.     Physical Exam Updated Vital Signs BP 115/69 (BP Location: Left Arm)   Pulse 89   Temp 98.2 F (36.8 C) (Oral)   Resp 14   Ht 5\' 8"  (1.727 m)   Wt 79.4 kg (175 lb)   SpO2 98%   BMI 26.61 kg/m   Physical Exam  Constitutional: He appears well-developed.  HENT:  Head: Normocephalic.  Eyes: Conjunctivae are normal.  Neck: Neck supple.  Cardiovascular: Normal rate and regular rhythm.   No murmur heard. Pulmonary/Chest: Effort normal.  Abdominal: Soft. He exhibits no distension.  Musculoskeletal: Normal range of motion. He exhibits tenderness and deformity. He exhibits no edema.  Tender to palpation over the anteromedial compartment of the bilateral knees. No lateral joint line tenderness. Full active and passive range of motion of the bilateral knees with crepitus noted.Sensation is intact throughout. 2+ DP and PT pulses. Negative anterior and posterior drawer test. Negative Lachman and McMurray.  Exam of the bilateral hips and bilateral ankles is unremarkable. Able to bear weight on the bilateral extremities without difficulty. Symmetric tandem gait.  No overlying warmth, erythema, or edema to the bilateral knees.  Neurological: He is alert.  Skin: Skin is warm and dry.  Psychiatric: His behavior is normal.  Nursing note and vitals reviewed.    ED  Treatments / Results  Labs (all labs ordered are listed, but only abnormal results are displayed) Labs Reviewed - No data to display  EKG  EKG Interpretation None       Radiology Dg Knee Complete 4 Views Left  Result Date: 05/12/2017 CLINICAL DATA:  Left knee pain and swelling for 5 months with no known injury. EXAM: LEFT KNEE - COMPLETE 4+ VIEW COMPARISON:  None. FINDINGS: Small to moderate joint effusion. Osteoarthritis with spurring and joint narrowing at the medial patellofemoral compartments. No fracture deformity or evidence of bone lesion. IMPRESSION: 1. Osteoarthritis with medial and patellofemoral compartment narrowing. 2. Joint effusion. Electronically Signed   By: Monte Fantasia M.D.   On: 05/12/2017 16:22    Procedures Procedures (including critical care time)  Medications Ordered in ED Medications - No data to display   Initial Impression / Assessment and Plan / ED Course  I have reviewed the triage vital signs and the nursing notes.  Pertinent labs & imaging results that were available during my care of the patient were reviewed by me and considered in my medical decision making (see chart for details).     46 year old male with chronic, atraumatic bilateral knee pain, L>R. Left knee x-ray demonstrating tricompartmental osteoarthritis. No overlying edema, erythema, or warmth to the bilateral knees. Doubt gout or septic joint at this time. On exam, the patient is tender to palpation over the anteromedial compartment. Symmetric tandem gait. Discussed discharging the patient home with meloxicam and follow-up to orthopedics, which the patient is agreeable to at this time. No acute distress. Vital signs stable. The patient is safe for discharge at this time.  Final Clinical Impressions(s) / ED Diagnoses   Final diagnoses:  Primary osteoarthritis of both knees    New Prescriptions New Prescriptions   MELOXICAM (MOBIC) 15 MG TABLET    Take 0.5 tablets (7.5 mg total)  by mouth daily.     Joanne Gavel, PA-C 05/12/17 1946    Virgel Manifold, MD 05/13/17 1147

## 2017-05-15 DIAGNOSIS — M170 Bilateral primary osteoarthritis of knee: Secondary | ICD-10-CM | POA: Diagnosis not present

## 2017-07-02 DIAGNOSIS — M170 Bilateral primary osteoarthritis of knee: Secondary | ICD-10-CM | POA: Diagnosis not present

## 2017-07-11 DIAGNOSIS — M170 Bilateral primary osteoarthritis of knee: Secondary | ICD-10-CM | POA: Diagnosis not present

## 2017-07-18 DIAGNOSIS — M170 Bilateral primary osteoarthritis of knee: Secondary | ICD-10-CM | POA: Diagnosis not present

## 2017-10-09 DIAGNOSIS — M170 Bilateral primary osteoarthritis of knee: Secondary | ICD-10-CM | POA: Diagnosis not present

## 2017-10-10 ENCOUNTER — Telehealth: Payer: Self-pay

## 2017-10-10 NOTE — Telephone Encounter (Signed)
    [facility] Pre-operative Risk Assessment    Request for surgical clearance:  1. What type of surgery is being performed? LEFT PARTIAL KNEE REPLACEMENT   2. When is this surgery scheduled? PENDING   3. What type of clearance is required (medical clearance vs. Pharmacy clearance to hold med vs. Both)? MEDICAL  4. Are there any medications that need to be held prior to surgery and how long?NONE LISTED   5. Practice name and name of physician performing surgery?  [facility]  ATTN:SHERRI  6. What is your office phone and fax number? [place] [phone]  7. Anesthesia type (None, local, MAC, general) ? NOT LISTED 8. PLEASE SEND MOST RECENT NOTES,LABS,EKG OR SPECIAL STUDIES-LISTED ON REQUEST   Waylan Rocher 10/10/2017, 11:12 AM  _________________________________________________________________   (provider comments below)

## 2017-10-13 NOTE — Telephone Encounter (Signed)
   Primary Cardiologist:No primary care provider on file.  Chart reviewed as part of pre-operative protocol coverage. Because of Chris Fox's past medical history and time since last visit, he/she will require a follow-up visit in order to better assess preoperative cardiovascular risk.  Pre-op covering staff: - Please schedule appointment and call patient to inform them. - Please contact requesting surgeon's office via preferred method (i.e, phone, fax) to inform them of need for appointment prior to surgery.  Note, patient had cardiac cath in 2013 that showed clean coronaries, felt to be possible pericarditis. Failed to followup since. No established cardiologist, previous seen by Dr. Aundra Dubin who has since transitioned to heart failure. If orthopedic require a preop clearance, then will need to be seen by a cardiologist.   Almyra Deforest, PA  10/13/2017, 5:49 PM

## 2017-10-14 NOTE — Telephone Encounter (Signed)
Pt has schedule appointment with Pur[facility] Nails on 4/3 @ 10am with Arnold Long.

## 2017-10-17 ENCOUNTER — Telehealth: Payer: Self-pay | Admitting: Adult Health

## 2017-10-17 NOTE — Telephone Encounter (Signed)
Received progress note from [facility] on 10/17/17, Appt 10/29/17 @ 10:00am. NV

## 2017-10-28 DIAGNOSIS — Z01818 Encounter for other preprocedural examination: Secondary | ICD-10-CM | POA: Diagnosis not present

## 2017-10-28 DIAGNOSIS — Z6826 Body mass index (BMI) 26.0-26.9, adult: Secondary | ICD-10-CM | POA: Diagnosis not present

## 2017-10-28 DIAGNOSIS — Z1389 Encounter for screening for other disorder: Secondary | ICD-10-CM | POA: Diagnosis not present

## 2017-10-28 NOTE — Progress Notes (Signed)
Cardiology Office Note   Date:  10/29/2017   ID:  Chris Fox, DOB 01-23-1971, MRN [id number]  PCP:  Patient, No Pcp Per  Cardiologist: Dr. Aundra Fox (not seen since 2013)  Chief Complaint  Patient presents with  . Pre-op Exam  . Chest Pain     History of Present Illness: Chris Fox is a 47 y.o. male who presents for preoperative cardiac clearance with left partial knee replacement planned.  He has been seen in the past by Dr. Algernon Fox in 2013 in the setting of pericarditis and recurrent chest pain.  The patient had echocardiogram which was normal, no problems with LV function or valvular heart disease was found.. Patient also had a cardiac catheterization at that time revealing normal coronaries.  He was placed on a course of colchicine but stopped taking it after about a week as it was causing a lot of GI upset.  He has been taking Advil on occasion.  The patient is complaining of recurrent chest pain.  This occurs with and without exertion.  He describes it as sharp taking his breath away radiating into his back.  He denies associated diaphoresis dizziness nausea or vomiting.  He denies changes in his energy level.  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  "If I do not work I do not get paid."  [name].    Past Medical History:  Diagnosis Date  . Abdominal pain    a. LLQ 11/2011  . GERD (gastroesophageal reflux disease)   . Gout   . Headache(784.0)   . Midsternal chest pain    a. Cardiac cath 12/10/11 with normal coronaries and LV systolic function;  07/624 Echo EF 55-60%, no valvular abnormalities or pericardial effusion  . Pericarditis    a. presumed - 11/2011  . ST elevation    a. Anterior ST elevation ? early repolarization vs pericarditis    Past Surgical History:  Procedure Laterality Date  . CARDIAC CATHETERIZATION  12/10/11   normal coronaries and LV systolic function;  Echo with normal LV function, EF 55-60%,  no valvular abnormalities or pericardial effusion  . I&D EXTREMITY Right 08/27/2016   Procedure: IRRIGATION AND DEBRIDEMENT RIGHT FOOT;  Surgeon: Chris Fox, DPM;  Location: [facility];  Service: Podiatry;  Laterality: Right;  . LEFT HEART CATHETERIZATION WITH CORONARY ANGIOGRAM N/A 12/10/2011   Procedure: LEFT HEART CATHETERIZATION WITH CORONARY ANGIOGRAM;  Surgeon: Chris Dresser, MD;  Location: [facility] CATH LAB;  Service: Cardiovascular;  Laterality: N/A;     Current Outpatient Medications  Medication Sig Dispense Refill  . ibuprofen (ADVIL,MOTRIN) 800 MG tablet Take 1 tablet (800 mg total) by mouth every 8 (eight) hours as needed. 30 tablet 0   No current facility-administered medications for this visit.     Allergies:   Colchicine    Social History:  The patient  reports that he quit smoking about 15 years ago. His smoking use included cigarettes. He has a 31.50 pack-year smoking history. He has never used smokeless tobacco. He reports that he does not drink alcohol or use drugs.   Family History:  The patient's family history includes Colon cancer in his mother; Diabetes in his maternal grandmother; Hypertension in his maternal grandmother and mother.    ROS: All other systems are reviewed and negative. Unless otherwise mentioned in H&P    PHYSICAL EXAM: VS:  BP 115/72 (BP Location: Right Arm)   Pulse Chris Fox Kitchen)  52   Ht 5\' 8"  (1.727 m)   Wt 175 lb 6.4 oz (79.6 kg)   BMI 26.67 kg/m  , BMI Body mass index is 26.67 kg/m. GEN: Well nourished, well developed, in no acute distress  HEENT: normal  Neck: no JVD, carotid bruits, or masses Cardiac: RRR; 1/6 systolic murmurs, no rubs, or gallops,no edema  Respiratory:  Clear to auscultation bilaterally, normal work of breathing, no pain with coughing GI: soft, nontender, nondistended, + BS MS: no deformity or atrophy bilateral knee braces in place. Skin: warm and dry, no rash Neuro:  Strength and sensation are intact Psych: euthymic mood, full  affect   EKG: Sinus bradycardia, heart rate of 52 bpm, LVH with early repolarization noted.  Recent Labs: No results found for requested labs within last 8760 hours.    Lipid Panel    Component Value Date/Time   CHOL 151 12/10/2011 1459   TRIG 106 12/10/2011 1459   HDL 35 (L) 12/10/2011 1459   CHOLHDL 4.3 12/10/2011 1459   VLDL 21 12/10/2011 1459   LDLCALC 95 12/10/2011 1459      Wt Readings from Last 3 Encounters:  10/29/17 175 lb 6.4 oz (79.6 kg)  05/12/17 175 lb (79.4 kg)  08/25/16 180 lb (81.6 kg)      Other studies Reviewed: Cardiac Catheterization Procedure Note 12/10/2011  Procedure: Left Heart Cath, Selective Coronary Angiography, LV angiography  Indication: Chest pain, ECG with anteroseptal ST elevation   Procedural details: The right groin was prepped, draped, and anesthetized with 1% lidocaine. Using modified Seldinger technique, a 6 French sheath was introduced into the right femoral artery. Standard Judkins catheters were used for coronary angiography and left ventriculography. Catheter exchanges were performed over a guidewire. There were no immediate procedural complications. The patient was transferred to the post catheterization recovery area for further monitoring.  Procedural Findings: Hemodynamics:  AO 112/70 LV 112/16              Coronary angiography: Coronary dominance: right  Left mainstem: Short, no angiographic coronary disease.   Left anterior descending (LAD): No angiographic coronary disease.   Left circumflex (LCx): No angiographic coronary disease.   Right coronary artery (RCA): Relatively small vessel, no angiographic coronary disease.   Left ventriculography: Left ventricular systolic function is normal, LVEF is estimated at 55%, there is no significant mitral regurgitation   Final Conclusions:  Normal coronaries.  Normal LV systolic function.  Possible early repolarization versus pericarditis pattern on ECG (chest  pain is pleuritic).  Will plan to treat with Ibuprofen and PPI for now.  Awaiting labs including BMET, CBC, cardiac enzymes.   [name] 12/10/2011, 1:08 PM  ASSESSMENT AND PLAN:  1.  Recurrent chest pain: Atypical features described as stabbing on the right side of his chest radiating to his back with and without exertion "coming out of nowhere.".  No associated symptoms surrounding chest discomfort.  We will plan Lexiscan nuclear medicine study for diagnostic prognostic purposes in order to evaluate further prior to surgery clearance.  Echocardiogram is also recommended.  I discussed this with Dr. Skeet Latch, DOD at our [facility] office today.  She is in agreement with my current assessment and plan.  I have spoke with the patient about this.  He becomes upset as he states he does not want to have to come back for another set of tests which require more co-pays.  He states that if he does not work he does not get paid and he is  paying his insurance out of pocket, and each of these visits prior to his surgery is becoming too expensive for him.    He also states that paying separate co-pays for testing is not something he is financially able to do.  He is requesting that we do the nuclear medicine stress test today so that he does not have to come back on the second day, lose a day of work, and pain additional co-pay.  I have spoken with our office nuclear medicine staff, and explained the situation.  They are able to working man around 1:00 this afternoon and completed a [employer].  The patient is unwilling to come back to pay a second co-pay for echocardiogram and therefore this will not be ordered.  2.  Hypertension: Blood pressures currently well controlled no changes at this time.  He is not on any antihypertensive medications.  3.  History of pericarditis: No evidence on review of his EKG although he does have early repolarization abnormalities making it appear abnormal.   EKG has been reviewed by Dr. Oval Linsey as well.  Current medicines are reviewed at length with the patient today.    We will not be able to provide cardiac clearance until nuclear medicine study is completed and reviewed.  The patient refuses echocardiogram as he does not wish to lose another day of work and pain additional co-pay.  If nuclear medicine study is abnormal we may need to push this issue in order for him to be cleared for surgery.  I have explained this to him.  He becomes angry and states that "I guess I want to have knee surgery then." I apologized for the need to have these tests completed but explained that anesthesia is requesting this in order to clear him to proceed.  We have spent greater than 45 minutes with this patient explaining the procedures, discussing with nuclear medicine and insurance staff to be able to proceed with the stress test today.  Labs/ tests ordered today include: Nuclear medicine Lexiscan.  Phill Myron. West Pugh, ANP, AACC   10/29/2017 11:40 AM    [facility] [address], [place], [place] [address] Phone: [phone]; Fax: [phone]

## 2017-10-29 ENCOUNTER — Ambulatory Visit: Payer: BLUE CROSS/BLUE SHIELD | Admitting: Adult Health

## 2017-10-29 ENCOUNTER — Encounter: Payer: Self-pay | Admitting: Adult Health

## 2017-10-29 ENCOUNTER — Ambulatory Visit (HOSPITAL_COMMUNITY)
Admission: RE | Admit: 2017-10-29 | Discharge: 2017-10-29 | Disposition: A | Discharge status: Home / Self Care | Payer: BLUE CROSS/BLUE SHIELD | Source: Ambulatory Visit | Attending: Cardiovascular Disease | Admitting: Cardiovascular Disease

## 2017-10-29 VITALS — BP 115/72 | HR 52 | Ht 68.0 in | Wt 175.4 lb

## 2017-10-29 DIAGNOSIS — R0789 Other chest pain: Secondary | ICD-10-CM | POA: Diagnosis not present

## 2017-10-29 DIAGNOSIS — L02619 Cutaneous abscess of unspecified foot: Secondary | ICD-10-CM | POA: Diagnosis not present

## 2017-10-29 DIAGNOSIS — K219 Gastro-esophageal reflux disease without esophagitis: Secondary | ICD-10-CM | POA: Diagnosis not present

## 2017-10-29 DIAGNOSIS — Z87891 Personal history of nicotine dependence: Secondary | ICD-10-CM | POA: Diagnosis not present

## 2017-10-29 DIAGNOSIS — R931 Abnormal findings on diagnostic imaging of heart and coronary circulation: Secondary | ICD-10-CM | POA: Diagnosis not present

## 2017-10-29 DIAGNOSIS — I252 Old myocardial infarction: Secondary | ICD-10-CM | POA: Diagnosis not present

## 2017-10-29 DIAGNOSIS — R001 Bradycardia, unspecified: Secondary | ICD-10-CM | POA: Diagnosis not present

## 2017-10-29 LAB — MYOCARDIAL PERFUSION IMAGING
CHL CUP NUCLEAR SSS: 1
LVDIAVOL: 116 mL (ref 62–150)
LVSYSVOL: 55 mL
Peak HR: 85 {beats}/min
Rest HR: 43 {beats}/min
SDS: 1
SRS: 0
TID: 1.02

## 2017-10-29 MED ORDER — TECHNETIUM TC 99M TETROFOSMIN IV KIT
11.0000 | PACK | Freq: Once | INTRAVENOUS | Status: AC | PRN
  Administered 2017-10-29: 11 via INTRAVENOUS
  Filled 2017-10-29: qty 11

## 2017-10-29 MED ORDER — TECHNETIUM TC 99M TETROFOSMIN IV KIT
30.2000 | PACK | Freq: Once | INTRAVENOUS | Status: AC | PRN
  Administered 2017-10-29: 30.2 via INTRAVENOUS
  Filled 2017-10-29: qty 31

## 2017-10-29 MED ORDER — REGADENOSON 0.4 MG/5ML IV SOLN
0.4000 mg | Freq: Once | INTRAVENOUS | Status: AC
  Administered 2017-10-29: 0.4 mg via INTRAVENOUS

## 2017-10-29 NOTE — Patient Instructions (Signed)
Your physician has requested that you have a lexiscan myoview. A cardiac stress test is a cardiological test that measures the heart's ability to respond to external stress in a controlled clinical environment. The stress response is induced byintravenous pharmacological stimulation. For further information please visit [facility]. Please follow instructions below. How to prepare for your Myocardial Perfusion Test:   Do not eat or drink 3 hours prior to your test, except you may have water.  Do not consume products containing caffeine (regular or decaffeinated) 12 hours prior to your test. (ex: coffee, chocolate, sodas, tea).  Do wear comfortable clothes (no dresses or overalls) and walking shoes, tennis shoes preferred (No heels or open toe shoes are allowed).  Do NOT wear cologne, perfume, aftershave, or lotions (deodorant is allowed).  If these instructions are not followed, your test will have to be rescheduled.  If you have questions or concerns about your appointment, you can call the Nuclear Lab at [phone].

## 2017-11-17 ENCOUNTER — Telehealth: Payer: Self-pay

## 2017-11-17 NOTE — Telephone Encounter (Signed)
   [facility] Pre-operative Risk Assessment    Request for surgical clearance:  1. What type of surgery is being performed? Left Partial Knee Replacement  2. When is this surgery scheduled? TBD  3. What type of clearance is required (medical clearance vs. Pharmacy clearance to hold med vs. Both)? Medical  4. Are there any medications that need to be held prior to surgery and how long? N/A  5. Practice name and name of physician performing surgery? [facility] (Dr. Elsie Saas)  6. What is your office phone and fax number? [phone] Fax: [phone] Attn:  7. sherri  8. Anesthesia type (None, local, MAC, general) ? Unkown   Meryl Crutch 11/17/2017, 4:37 PM  _________________________________________________________________   (provider comments below)

## 2017-11-18 NOTE — Telephone Encounter (Signed)
   Primary Cardiologist: Dr McLean/ [name], NP  Chart reviewed as part of pre-operative protocol coverage. The patient has hx of normal coronary arteries by cath in 2013. He was recently evaluated for atypical chest pain. He has a low risk myoview with normal EF on 10/29/2017.  Based on ACC/AHA guidelines, Chris Fox would be at acceptable risk for the planned procedure without further cardiovascular testing.   I will route this recommendation to the requesting party via Epic fax function and remove from pre-op pool.  Please call with questions.  Daune Perch, NP 11/18/2017, 3:20 PM

## 2017-12-17 ENCOUNTER — Other Ambulatory Visit (HOSPITAL_COMMUNITY): Payer: BLUE CROSS/BLUE SHIELD

## 2017-12-29 ENCOUNTER — Ambulatory Visit: Admit: 2017-12-29 | Payer: BLUE CROSS/BLUE SHIELD | Admitting: Orthopedic Surgery

## 2017-12-29 SURGERY — ARTHROPLASTY, KNEE, UNICOMPARTMENTAL
Anesthesia: Choice | Laterality: Left

## 2018-06-17 ENCOUNTER — Emergency Department (HOSPITAL_COMMUNITY): Payer: BLUE CROSS/BLUE SHIELD

## 2018-06-17 ENCOUNTER — Emergency Department (HOSPITAL_COMMUNITY)
Admission: EM | Admit: 2018-06-17 | Discharge: 2018-06-17 | Disposition: A | Discharge status: Home / Self Care | Payer: BLUE CROSS/BLUE SHIELD | Attending: Emergency Medicine | Admitting: Emergency Medicine

## 2018-06-17 ENCOUNTER — Encounter (HOSPITAL_COMMUNITY): Payer: Self-pay | Admitting: *Deleted

## 2018-06-17 DIAGNOSIS — Y93K1 Activity, walking an animal: Secondary | ICD-10-CM | POA: Diagnosis not present

## 2018-06-17 DIAGNOSIS — Y9289 Other specified places as the place of occurrence of the external cause: Secondary | ICD-10-CM | POA: Diagnosis not present

## 2018-06-17 DIAGNOSIS — Y999 Unspecified external cause status: Secondary | ICD-10-CM | POA: Diagnosis not present

## 2018-06-17 DIAGNOSIS — W540XXA Bitten by dog, initial encounter: Secondary | ICD-10-CM | POA: Diagnosis not present

## 2018-06-17 DIAGNOSIS — Z2914 Encounter for prophylactic rabies immune globin: Secondary | ICD-10-CM | POA: Insufficient documentation

## 2018-06-17 DIAGNOSIS — Z79899 Other long term (current) drug therapy: Secondary | ICD-10-CM | POA: Insufficient documentation

## 2018-06-17 DIAGNOSIS — Z23 Encounter for immunization: Secondary | ICD-10-CM | POA: Insufficient documentation

## 2018-06-17 DIAGNOSIS — S41151A Open bite of right upper arm, initial encounter: Secondary | ICD-10-CM | POA: Diagnosis not present

## 2018-06-17 DIAGNOSIS — M25531 Pain in right wrist: Secondary | ICD-10-CM | POA: Diagnosis not present

## 2018-06-17 DIAGNOSIS — S61551A Open bite of right wrist, initial encounter: Secondary | ICD-10-CM | POA: Diagnosis not present

## 2018-06-17 DIAGNOSIS — M79641 Pain in right hand: Secondary | ICD-10-CM | POA: Diagnosis not present

## 2018-06-17 DIAGNOSIS — S61451A Open bite of right hand, initial encounter: Secondary | ICD-10-CM | POA: Insufficient documentation

## 2018-06-17 DIAGNOSIS — Z87891 Personal history of nicotine dependence: Secondary | ICD-10-CM | POA: Diagnosis not present

## 2018-06-17 DIAGNOSIS — Z203 Contact with and (suspected) exposure to rabies: Secondary | ICD-10-CM | POA: Diagnosis not present

## 2018-06-17 MED ORDER — LIDOCAINE HCL 2 % IJ SOLN
10.0000 mL | Freq: Once | INTRAMUSCULAR | Status: AC
  Administered 2018-06-17: 200 mg via INTRADERMAL
  Filled 2018-06-17: qty 20

## 2018-06-17 MED ORDER — RABIES IMMUNE GLOBULIN 150 UNIT/ML IM INJ
1500.0000 [IU] | INJECTION | Freq: Once | INTRAMUSCULAR | Status: AC
  Administered 2018-06-17: 1500 [IU] via INTRAMUSCULAR
  Filled 2018-06-17: qty 10

## 2018-06-17 MED ORDER — IBUPROFEN 800 MG PO TABS
800.0000 mg | ORAL_TABLET | Freq: Once | ORAL | Status: AC
  Administered 2018-06-17: 800 mg via ORAL
  Filled 2018-06-17: qty 1

## 2018-06-17 MED ORDER — RABIES VACCINE, PCEC IM SUSR
1.0000 mL | Freq: Once | INTRAMUSCULAR | Status: AC
  Administered 2018-06-17: 1 mL via INTRAMUSCULAR
  Filled 2018-06-17: qty 1

## 2018-06-17 MED ORDER — TETANUS-DIPHTH-ACELL PERTUSSIS 5-2.5-18.5 LF-MCG/0.5 IM SUSP
0.5000 mL | Freq: Once | INTRAMUSCULAR | Status: AC
  Administered 2018-06-17: 0.5 mL via INTRAMUSCULAR
  Filled 2018-06-17: qty 0.5

## 2018-06-17 MED ORDER — AMOXICILLIN-POT CLAVULANATE 875-125 MG PO TABS: 1.0000 | ORAL_TABLET | Freq: Two times a day (BID) | ORAL | 0 refills | Status: AC

## 2018-06-17 NOTE — Discharge Instructions (Signed)
You were given a prescription for antibiotics. Please take the antibiotic prescription fully.   You will need to follow-up at urgent care for the remainder of your rabies vaccinations.  You were also given a referral to a hand doctor, please make an appointment for follow-up within the next week for reevaluation.  Please return to the emergency room immediately if you experience any new or worsening symptoms or any symptoms that indicate worsening infection such as fevers, increased redness/swelling/pain, warmth, or drainage from the affected area.  Please also return to the emergency department for any numbness to the hand or wrist, increased swelling or uncontrolled pain.

## 2018-06-17 NOTE — ED Notes (Signed)
Patient tolerated wound cleaning with wound cleanser without incident.

## 2018-06-17 NOTE — ED Triage Notes (Signed)
Pt in after a dog bite, unknown if dog was vaccinated, punctures to right forearm

## 2018-06-17 NOTE — ED Provider Notes (Signed)
[facility] EMERGENCY DEPARTMENT Provider Note   CSN: [id number] Arrival date & time: 06/17/18  1050     History   Chief Complaint Chief Complaint  Patient presents with  . Animal Bite    HPI Chris Fox is a 47 y.o. male.  HPI  Patient is a 47 year old male with a history of GERD, gout, headaches, pericarditis, who presents emergency department today for evaluation of a dog bite that occurred just prior to arrival.  Patient states he was walking his dog at the dog park when another dog that was unleashed started attacking his dog.  An attempt to get his dog in a safe place, he was ultimately bit by another person's dog.  States the dog was a pit bull.  He was bit on the right arm and hand.   He denies any numbness to the right hand but states that his strength is decreased due to pain.  Also has pain with range of motion.  Has a small abrasion to his face but denies any other significant bite wounds on his body.  He is not sure if the dog is vaccinated but he assumes that it is not.  He denies ever having rabies vaccines in the past.  His tetanus shot is not up-to-date.   Past Medical History:  Diagnosis Date  . Abdominal pain    a. LLQ 11/2011  . GERD (gastroesophageal reflux disease)   . Gout   . Headache(784.0)   . Midsternal chest pain    a. Cardiac cath 12/10/11 with normal coronaries and LV systolic function;  09/4191 Echo EF 55-60%, no valvular abnormalities or pericardial effusion  . Pericarditis    a. presumed - 11/2011  . ST elevation    a. Anterior ST elevation ? early repolarization vs pericarditis    Patient Active Problem List   Diagnosis Date Noted  . Foot abscess 08/25/2016  . Gastroesophageal reflux disease 08/25/2016  . Gout 08/25/2016  . Midsternal chest pain   . ST elevation   . Abdominal pain   . Chest pain 12/10/2011  . Bradycardia 12/10/2011    Past Surgical History:  Procedure Laterality Date  . CARDIAC CATHETERIZATION   12/10/11   normal coronaries and LV systolic function;  Echo with normal LV function, EF 55-60%, no valvular abnormalities or pericardial effusion  . I&D EXTREMITY Right 08/27/2016   Procedure: IRRIGATION AND DEBRIDEMENT RIGHT FOOT;  Surgeon: Edrick Kins, DPM;  Location: [facility];  Service: Podiatry;  Laterality: Right;  . LEFT HEART CATHETERIZATION WITH CORONARY ANGIOGRAM N/A 12/10/2011   Procedure: LEFT HEART CATHETERIZATION WITH CORONARY ANGIOGRAM;  Surgeon: Larey Dresser, MD;  Location: [facility] CATH LAB;  Service: Cardiovascular;  Laterality: N/A;        Home Medications    Prior to Admission medications   Medication Sig Start Date End Date Taking? Authorizing Provider  amoxicillin-clavulanate (AUGMENTIN) 875-125 MG tablet Take 1 tablet by mouth every 12 (twelve) hours for 7 days. 06/17/18 06/24/18  Shereena Berquist S, PA-C  ibuprofen (ADVIL,MOTRIN) 800 MG tablet Take 1 tablet (800 mg total) by mouth every 8 (eight) hours as needed. 02/21/16   Dalia Heading, PA-C    Family History Family History  Problem Relation Age of Onset  . Diabetes Maternal Grandmother   . Hypertension Maternal Grandmother   . Colon cancer Mother   . Hypertension Mother     Social History Social History   Tobacco Use  . Smoking status: Former Smoker  Packs/day: 1.50    Years: 21.00    Pack years: 31.50    Types: Cigarettes    Last attempt to quit: 09/11/2002    Years since quitting: 15.7  . Smokeless tobacco: Never Used  Substance Use Topics  . Alcohol use: No  . Drug use: No    Frequency: 20.0 times per week    Types: Marijuana, Other-see comments    Comment: quit 1 month ago     Allergies   Colchicine   Review of Systems Review of Systems  Constitutional: Negative for chills and fever.  Musculoskeletal:       Right hand/wrist pain  Skin: Positive for wound.  Neurological: Negative for numbness.     Physical Exam Updated Vital Signs BP 121/87 (BP Location: Left Arm)   Pulse  68   Temp 98.1 F (36.7 C) (Oral)   Resp 18   Ht 5\' 8"  (1.727 m)   Wt 77.1 kg   SpO2 98%   BMI 25.85 kg/m   Physical Exam  Constitutional: He is oriented to person, place, and time. He appears well-developed and well-nourished. No distress.  HENT:  Head: Normocephalic.  Small abrasion to the right cheek.  No tenderness throughout the bones of the face.  No crepitus of the face.  Eyes: Conjunctivae are normal.  Cardiovascular: Normal rate.  Pulmonary/Chest: Effort normal.  Musculoskeletal:  Multiple abrasions to the right wrist (pictured below).  There is tenderness to the distal radius and ulna with overlying soft tissue swelling.  Patient can move all fingers and there is brisk cap refill to all fingers on the right hand.  Radial and ulnar pulses are intact.  Normal sensation to the fingers bilaterally.  Decreased grip strength on the right secondary to pain.  Decreased ability to flex/extend wrist secondary to pain on the right. Compartments are soft.  Neurological: He is alert and oriented to person, place, and time.  Skin: Skin is warm and dry.         ED Treatments / Results  Labs (all labs ordered are listed, but only abnormal results are displayed) Labs Reviewed - No data to display  EKG None  Radiology Dg Wrist Complete Right  Result Date: 06/17/2018 CLINICAL DATA:  Acute RIGHT wrist pain following dog bite. Initial encounter. EXAM: RIGHT WRIST - COMPLETE 3+ VIEW COMPARISON:  None. FINDINGS: Soft tissue swelling and gas noted. No radiopaque foreign bodies are present. No fracture, subluxation or dislocation identified. No focal bony lesions are present. IMPRESSION: Soft tissue injury without acute bony abnormality or radiopaque foreign body. Electronically Signed   By: Margarette Canada M.D.   On: 06/17/2018 12:32   Dg Hand Complete Right  Result Date: 06/17/2018 CLINICAL DATA:  Acute RIGHT hand pain following dog bite today. Initial encounter. EXAM: RIGHT HAND -  COMPLETE 3+ VIEW COMPARISON:  None. FINDINGS: No acute fracture, subluxation or dislocation. No radiopaque foreign bodies are identified. No focal bony lesions or joint abnormalities noted. IMPRESSION: No acute bony abnormality or radiopaque foreign body. Electronically Signed   By: Margarette Canada M.D.   On: 06/17/2018 12:32    Procedures Procedures (including critical care time)  Medications Ordered in ED Medications  rabies immune globulin (HYPERAB/KEDRAB) injection 1,500 Units (has no administration in time range)  Tdap (BOOSTRIX) injection 0.5 mL (0.5 mLs Intramuscular Given 06/17/18 1144)  rabies vaccine (RABAVERT) injection 1 mL (1 mL Intramuscular Given 06/17/18 1329)  ibuprofen (ADVIL,MOTRIN) tablet 800 mg (800 mg Oral Given 06/17/18 1143)  lidocaine (XYLOCAINE) 2 % (with pres) injection 200 mg (200 mg Intradermal Given by Other 06/17/18 1143)     Initial Impression / Assessment and Plan / ED Course  I have reviewed the triage vital signs and the nursing notes.  Pertinent labs & imaging results that were available during my care of the patient were reviewed by me and considered in my medical decision making (see chart for details).     Final Clinical Impressions(s) / ED Diagnoses   Final diagnoses:  Dog bite, initial encounter   Patient presents with lacerations from a dog bite.  Pt wounds irrigated well with with sterile saline.  Wounds examined with visualization of the base and no foreign bodies seen.  Pt Alert and oriented, NAD, nontoxic, nonseptic appearing.  Capillary refill intact and pt without neurologic deficit.  Distal pulses intact. x-rays of the right wrist and hand reviewed without evidence of bony injury or foreign body. Patient tetanus updated.  Patient rabies vaccine and immunoglobulin risk and benefit discussed.  Pt consents. Pain treated in the emergency department. Wounds not closed secondary to concern for infection. We'll discharge home with pain medication,  Augmentin and requests for close follow-up with PCP or back in the ER. Will also give referral to hand surgery given location of the wounds and decreased rom of the right hand. Have advised to return to the ed for new or worsening sxs. All questions answered.  ED Discharge Orders         Ordered    amoxicillin-clavulanate (AUGMENTIN) 875-125 MG tablet  Every 12 hours     06/17/18 1336           Rodney Booze, PA-C 06/17/18 1336    Tegeler, Gwenyth Allegra, MD 06/17/18 [phone]

## 2018-08-24 ENCOUNTER — Encounter (HOSPITAL_COMMUNITY): Payer: Self-pay

## 2018-08-24 ENCOUNTER — Emergency Department (HOSPITAL_COMMUNITY): Payer: Self-pay

## 2018-08-24 ENCOUNTER — Emergency Department (HOSPITAL_COMMUNITY)
Admission: EM | Admit: 2018-08-24 | Discharge: 2018-08-25 | Disposition: A | Discharge status: Home / Self Care | Payer: Self-pay | Attending: Emergency Medicine | Admitting: Emergency Medicine

## 2018-08-24 DIAGNOSIS — M25562 Pain in left knee: Secondary | ICD-10-CM | POA: Diagnosis not present

## 2018-08-24 DIAGNOSIS — M25462 Effusion, left knee: Secondary | ICD-10-CM | POA: Diagnosis not present

## 2018-08-24 DIAGNOSIS — G8929 Other chronic pain: Secondary | ICD-10-CM

## 2018-08-24 DIAGNOSIS — Z87891 Personal history of nicotine dependence: Secondary | ICD-10-CM | POA: Insufficient documentation

## 2018-08-24 DIAGNOSIS — Z79899 Other long term (current) drug therapy: Secondary | ICD-10-CM | POA: Insufficient documentation

## 2018-08-24 NOTE — ED Triage Notes (Signed)
Patient here for left knee pain and swelling.  Seen previously for the same and told it was a cartilage issue.  Talking ibuprofen/alieve for the pain.  Now swelling and pain has become unbearable.

## 2018-08-25 MED ORDER — DICLOFENAC SODIUM 1 % TD GEL: 4.0000 g | Freq: Four times a day (QID) | TRANSDERMAL | 1 refills | Status: DC

## 2018-08-25 NOTE — ED Provider Notes (Signed)
[facility] EMERGENCY DEPARTMENT Provider Note   CSN: [id number] Arrival date & time: 08/24/18  1925     History   Chief Complaint Chief Complaint  Patient presents with  . Knee Pain    HPI Chris Fox is a 48 y.o. male.  HPI  Chris Fox is a 48 y.o. male, with a history of gout, presenting to the ED with acute on chronic left knee pain.  Pain has been present intermittently for the past 2 years.  He has routinely been using ibuprofen and naproxen.  He has been intermittently wearing a knee sleeve. Has previously been seen at [facility] and surgery was recommended, however, patient lost his insurance.  He now has insurance again.  Denies falls/trauma, weakness, numbness, fever, lower leg swelling or pain, or any other complaints.   Past Medical History:  Diagnosis Date  . Abdominal pain    a. LLQ 11/2011  . GERD (gastroesophageal reflux disease)   . Gout   . Headache(784.0)   . Midsternal chest pain    a. Cardiac cath 12/10/11 with normal coronaries and LV systolic function;  11/5730 Echo EF 55-60%, no valvular abnormalities or pericardial effusion  . Pericarditis    a. presumed - 11/2011  . ST elevation    a. Anterior ST elevation ? early repolarization vs pericarditis    Patient Active Problem List   Diagnosis Date Noted  . Foot abscess 08/25/2016  . Gastroesophageal reflux disease 08/25/2016  . Gout 08/25/2016  . Midsternal chest pain   . ST elevation   . Abdominal pain   . Chest pain 12/10/2011  . Bradycardia 12/10/2011    Past Surgical History:  Procedure Laterality Date  . CARDIAC CATHETERIZATION  12/10/11   normal coronaries and LV systolic function;  Echo with normal LV function, EF 55-60%, no valvular abnormalities or pericardial effusion  . I&D EXTREMITY Right 08/27/2016   Procedure: IRRIGATION AND DEBRIDEMENT RIGHT FOOT;  Surgeon: Edrick Kins, DPM;  Location: [facility];  Service: Podiatry;  Laterality: Right;  .  LEFT HEART CATHETERIZATION WITH CORONARY ANGIOGRAM N/A 12/10/2011   Procedure: LEFT HEART CATHETERIZATION WITH CORONARY ANGIOGRAM;  Surgeon: Larey Dresser, MD;  Location: [facility] CATH LAB;  Service: Cardiovascular;  Laterality: N/A;        Home Medications    Prior to Admission medications   Medication Sig Start Date End Date Taking? Authorizing Provider  diclofenac sodium (VOLTAREN) 1 % GEL Apply 4 g topically 4 (four) times daily. 08/25/18   Ginette Bradway C, PA-C  ibuprofen (ADVIL,MOTRIN) 800 MG tablet Take 1 tablet (800 mg total) by mouth every 8 (eight) hours as needed. 02/21/16   Dalia Heading, PA-C    Family History Family History  Problem Relation Age of Onset  . Diabetes Maternal Grandmother   . Hypertension Maternal Grandmother   . Colon cancer Mother   . Hypertension Mother     Social History Social History   Tobacco Use  . Smoking status: Former Smoker    Packs/day: 1.50    Years: 21.00    Pack years: 31.50    Types: Cigarettes    Last attempt to quit: 09/11/2002    Years since quitting: 15.9  . Smokeless tobacco: Never Used  Substance Use Topics  . Alcohol use: No  . Drug use: No    Frequency: 20.0 times per week    Types: Marijuana, Other-see comments    Comment: quit 1 month ago  Allergies   Colchicine   Review of Systems Review of Systems  Constitutional: Negative for fever.  Musculoskeletal: Positive for arthralgias and joint swelling.  Skin: Negative for color change.  Neurological: Negative for weakness and numbness.     Physical Exam Updated Vital Signs BP 114/88   Pulse 62   Temp 98 F (36.7 C) (Oral)   Resp 16   Ht 5' 7.5" (1.715 m)   Wt 77.1 kg   SpO2 97%   BMI 26.23 kg/m   Physical Exam Vitals signs and nursing note reviewed.  Constitutional:      General: He is not in acute distress.    Appearance: He is well-developed. He is not diaphoretic.  HENT:     Head: Normocephalic and atraumatic.  Eyes:      Conjunctiva/sclera: Conjunctivae normal.  Neck:     Musculoskeletal: Neck supple.  Cardiovascular:     Rate and Rhythm: Normal rate and regular rhythm.     Pulses:          Posterior tibial pulses are 2+ on the right side and 2+ on the left side.  Pulmonary:     Effort: Pulmonary effort is normal.  Musculoskeletal:     Comments: Some tenderness and swelling to the lateral and medial left knee. Full range of motion without noted difficulty.  Some evidence of joint effusion. No increased warmth, erythema, or pain out of portion.  Skin:    General: Skin is warm and dry.     Coloration: Skin is not pale.  Neurological:     Mental Status: He is alert.     Comments: Sensation grossly intact to light touch in the lower extremities bilaterally.  Strength 5/5 in the bilateral lower extremities. Ambulatory without assistance.  Psychiatric:        Behavior: Behavior normal.      ED Treatments / Results  Labs (all labs ordered are listed, but only abnormal results are displayed) Labs Reviewed - No data to display  EKG None  Radiology Dg Knee Complete 4 Views Left  Result Date: 08/24/2018 CLINICAL DATA:  Left knee pain for months. Patient reports pain and swelling for years. EXAM: LEFT KNEE - COMPLETE 4+ VIEW COMPARISON:  Radiograph 05/12/2017 FINDINGS: Tricompartmental peripheral spurring, most prominent in the patellofemoral and medial tibiofemoral compartments. Medial compartment joint space narrowing appears similar to prior exam. Moderate joint effusion. No fracture or focal bone abnormality. IMPRESSION: 1. Moderate tricompartmental osteoarthritis, similar to prior exam. No acute osseous abnormalities. 2. Moderate joint effusion. Electronically Signed   By: Keith Rake M.D.   On: 08/24/2018 20:35    Procedures Procedures (including critical care time)  Medications Ordered in ED Medications - No data to display   Initial Impression / Assessment and Plan / ED Course  I  have reviewed the triage vital signs and the nursing notes.  Pertinent labs & imaging results that were available during my care of the patient were reviewed by me and considered in my medical decision making (see chart for details).     Patient presents with chronic left knee pain.  Neurovascularly intact.  Tricompartmental osteoarthritis on x-ray.  Orthopedic follow-up. The patient was given instructions for home care as well as return precautions. Patient voices understanding of these instructions, accepts the plan, and is comfortable with discharge.  Final Clinical Impressions(s) / ED Diagnoses   Final diagnoses:  Chronic pain of left knee    ED Discharge Orders  Ordered    diclofenac sodium (VOLTAREN) 1 % GEL  4 times daily     08/25/18 0624           Lorayne Bender, PA-C 08/25/18 0352    Ezequiel Essex, MD 08/25/18 [phone]

## 2018-08-25 NOTE — Discharge Instructions (Addendum)
Apply the diclofenac gel up to 4 times daily. Elevate the extremity whenever possible. Apply compression with Ace wrap or knee sleeve. Follow-up with the orthopedist on this matter.

## 2019-02-11 ENCOUNTER — Encounter (HOSPITAL_COMMUNITY): Payer: Self-pay

## 2019-02-11 ENCOUNTER — Other Ambulatory Visit: Payer: Self-pay

## 2019-02-11 ENCOUNTER — Ambulatory Visit (HOSPITAL_COMMUNITY)
Admission: EM | Admit: 2019-02-11 | Discharge: 2019-02-11 | Disposition: A | Discharge status: Home / Self Care | Payer: HRSA Program | Attending: Family Medicine | Admitting: Family Medicine

## 2019-02-11 DIAGNOSIS — R109 Unspecified abdominal pain: Secondary | ICD-10-CM | POA: Diagnosis not present

## 2019-02-11 DIAGNOSIS — Z833 Family history of diabetes mellitus: Secondary | ICD-10-CM | POA: Insufficient documentation

## 2019-02-11 DIAGNOSIS — Z800 Family history of malignant neoplasm of digestive organs: Secondary | ICD-10-CM | POA: Insufficient documentation

## 2019-02-11 DIAGNOSIS — Z888 Allergy status to other drugs, medicaments and biological substances status: Secondary | ICD-10-CM | POA: Insufficient documentation

## 2019-02-11 DIAGNOSIS — R197 Diarrhea, unspecified: Secondary | ICD-10-CM | POA: Diagnosis not present

## 2019-02-11 DIAGNOSIS — Z87891 Personal history of nicotine dependence: Secondary | ICD-10-CM | POA: Insufficient documentation

## 2019-02-11 DIAGNOSIS — Z1159 Encounter for screening for other viral diseases: Secondary | ICD-10-CM | POA: Diagnosis not present

## 2019-02-11 NOTE — ED Triage Notes (Signed)
Pt cc states he was out work because he had a stomach aches. Pt states he missed work.  Pt states he needs a Covid Test.

## 2019-02-11 NOTE — Discharge Instructions (Signed)
COVID testing done  We will call with any positive results.  You can also check my chart Follow up as needed for continued or worsening symptoms

## 2019-02-12 LAB — NOVEL CORONAVIRUS, NAA (HOSP ORDER, SEND-OUT TO REF LAB; TAT 18-24 HRS): SARS-CoV-2, NAA: NOT DETECTED

## 2019-02-12 NOTE — ED Provider Notes (Signed)
Chris Fox    CSN: [id number] Arrival date & time: 02/11/19  1010     History   Chief Complaint Chief Complaint  Patient presents with  . Covid test    HPI Chris Fox is a 48 y.o. male.   Pt is a 48 year old male that presents with abd pain, diarrhea. This has improved. No symptoms currently. No fever. He was concerned for COVID. No sore throat, cough, congestion. Possible exposure at work.   ROS per HPI      Past Medical History:  Diagnosis Date  . Abdominal pain    a. LLQ 11/2011  . GERD (gastroesophageal reflux disease)   . Gout   . Headache(784.0)   . Midsternal chest pain    a. Cardiac cath 12/10/11 with normal coronaries and LV systolic function;  09/2949 Echo EF 55-60%, no valvular abnormalities or pericardial effusion  . Pericarditis    a. presumed - 11/2011  . ST elevation    a. Anterior ST elevation ? early repolarization vs pericarditis    Patient Active Problem List   Diagnosis Date Noted  . Foot abscess 08/25/2016  . Gastroesophageal reflux disease 08/25/2016  . Gout 08/25/2016  . Midsternal chest pain   . ST elevation   . Abdominal pain   . Chest pain 12/10/2011  . Bradycardia 12/10/2011    Past Surgical History:  Procedure Laterality Date  . CARDIAC CATHETERIZATION  12/10/11   normal coronaries and LV systolic function;  Echo with normal LV function, EF 55-60%, no valvular abnormalities or pericardial effusion  . I&D EXTREMITY Right 08/27/2016   Procedure: IRRIGATION AND DEBRIDEMENT RIGHT FOOT;  Surgeon: Edrick Kins, DPM;  Location: [facility];  Service: Podiatry;  Laterality: Right;  . LEFT HEART CATHETERIZATION WITH CORONARY ANGIOGRAM N/A 12/10/2011   Procedure: LEFT HEART CATHETERIZATION WITH CORONARY ANGIOGRAM;  Surgeon: Larey Dresser, MD;  Location: [facility] CATH LAB;  Service: Cardiovascular;  Laterality: N/A;       Home Medications    Prior to Admission medications   Medication Sig Start Date End Date Taking? Authorizing  Provider  diclofenac sodium (VOLTAREN) 1 % GEL Apply 4 g topically 4 (four) times daily. Patient not taking: Reported on 02/11/2019 08/25/18   Lorayne Bender, PA-C  ibuprofen (ADVIL,MOTRIN) 800 MG tablet Take 1 tablet (800 mg total) by mouth every 8 (eight) hours as needed. 02/21/16   Dalia Heading, PA-C    Family History Family History  Problem Relation Age of Onset  . Diabetes Maternal Grandmother   . Hypertension Maternal Grandmother   . Colon cancer Mother   . Hypertension Mother     Social History Social History   Tobacco Use  . Smoking status: Former Smoker    Packs/day: 1.50    Years: 21.00    Pack years: 31.50    Types: Cigarettes    Quit date: 09/11/2002    Years since quitting: 16.4  . Smokeless tobacco: Never Used  Substance Use Topics  . Alcohol use: No  . Drug use: No    Frequency: 20.0 times per week    Types: Marijuana, Other-see comments    Comment: quit 1 month ago     Allergies   Colchicine   Review of Systems Review of Systems   Physical Exam Triage Vital Signs ED Triage Vitals  Enc Vitals Group     BP 02/11/19 1113 139/73     Pulse Rate 02/11/19 1113 78     Resp 02/11/19  1113 15     Temp 02/11/19 1113 98.4 F (36.9 C)     Temp Source 02/11/19 1113 Oral     SpO2 02/11/19 1113 100 %     Weight 02/11/19 1108 170 lb (77.1 kg)     Height --      Head Circumference --      Peak Flow --      Pain Score 02/11/19 1108 1     Pain Loc --      Pain Edu? --      Excl. in [place]? --    No data found.  Updated Vital Signs BP 139/73 (BP Location: Right Arm)   Pulse 78   Temp 98.4 F (36.9 C) (Oral)   Resp 15   Wt 170 lb (77.1 kg)   SpO2 100%   BMI 26.23 kg/m   Visual Acuity Right Eye Distance:   Left Eye Distance:   Bilateral Distance:    Right Eye Near:   Left Eye Near:    Bilateral Near:     Physical Exam Vitals signs and nursing note reviewed.  Constitutional:      Appearance: Normal appearance.  HENT:     Head:  Normocephalic and atraumatic.     Nose: Nose normal.  Eyes:     Conjunctiva/sclera: Conjunctivae normal.  Neck:     Musculoskeletal: Normal range of motion.  Pulmonary:     Effort: Pulmonary effort is normal.  Abdominal:     Palpations: Abdomen is soft.     Tenderness: There is no abdominal tenderness.  Musculoskeletal: Normal range of motion.  Skin:    General: Skin is warm and dry.  Neurological:     Mental Status: He is alert.  Psychiatric:        Mood and Affect: Mood normal.      UC Treatments / Results  Labs (all labs ordered are listed, but only abnormal results are displayed) Labs Reviewed  NOVEL CORONAVIRUS, NAA (HOSPITAL ORDER, SEND-OUT TO REF LAB)    EKG   Radiology No results found.  Procedures Procedures (including critical care time)  Medications Ordered in UC Medications - No data to display  Initial Impression / Assessment and Plan / UC Course  I have reviewed the triage vital signs and the nursing notes.  Pertinent labs & imaging results that were available during my care of the patient were reviewed by me and considered in my medical decision making (see chart for details).     Symptoms have resolved Worried about COVID based on exposure and diarrhea.  COVID testing done in clinic Precautions given Follow up as needed for continued or worsening symptoms  Final Clinical Impressions(s) / UC Diagnoses   Final diagnoses:  Diarrhea, unspecified type     Discharge Instructions     COVID testing done  We will call with any positive results.  You can also check my chart Follow up as needed for continued or worsening symptoms     ED Prescriptions    None     Controlled Substance Prescriptions [place] Controlled Substance Registry consulted? no   Orvan July, NP 02/12/19 1214

## 2019-02-22 ENCOUNTER — Telehealth: Payer: Self-pay | Admitting: General Practice

## 2019-02-22 ENCOUNTER — Telehealth (HOSPITAL_COMMUNITY): Payer: Self-pay | Admitting: Emergency Medicine

## 2019-02-22 NOTE — Telephone Encounter (Signed)
Pt called asking for work note, work note printed

## 2019-02-22 NOTE — Telephone Encounter (Signed)
Pt called in for his covid results.   Advised of NOT DETECTED result.

## 2020-02-21 ENCOUNTER — Encounter (HOSPITAL_COMMUNITY): Payer: Self-pay | Admitting: Emergency Medicine

## 2020-02-21 ENCOUNTER — Emergency Department (HOSPITAL_COMMUNITY): Payer: Self-pay

## 2020-02-21 ENCOUNTER — Emergency Department (HOSPITAL_COMMUNITY)
Admission: EM | Admit: 2020-02-21 | Discharge: 2020-02-21 | Disposition: A | Discharge status: Home / Self Care | Payer: Self-pay | Attending: Emergency Medicine | Admitting: Emergency Medicine

## 2020-02-21 ENCOUNTER — Other Ambulatory Visit: Payer: Self-pay

## 2020-02-21 DIAGNOSIS — R0789 Other chest pain: Secondary | ICD-10-CM | POA: Insufficient documentation

## 2020-02-21 DIAGNOSIS — F1590 Other stimulant use, unspecified, uncomplicated: Secondary | ICD-10-CM | POA: Insufficient documentation

## 2020-02-21 DIAGNOSIS — R519 Headache, unspecified: Secondary | ICD-10-CM | POA: Insufficient documentation

## 2020-02-21 DIAGNOSIS — R10819 Abdominal tenderness, unspecified site: Secondary | ICD-10-CM | POA: Insufficient documentation

## 2020-02-21 DIAGNOSIS — M791 Myalgia, unspecified site: Secondary | ICD-10-CM | POA: Insufficient documentation

## 2020-02-21 DIAGNOSIS — Z87891 Personal history of nicotine dependence: Secondary | ICD-10-CM | POA: Insufficient documentation

## 2020-02-21 DIAGNOSIS — R0602 Shortness of breath: Secondary | ICD-10-CM | POA: Insufficient documentation

## 2020-02-21 DIAGNOSIS — W57XXXA Bitten or stung by nonvenomous insect and other nonvenomous arthropods, initial encounter: Secondary | ICD-10-CM | POA: Insufficient documentation

## 2020-02-21 LAB — CBC
HCT: 39.2 % (ref 39.0–52.0)
Hemoglobin: 12.6 g/dL — ABNORMAL LOW (ref 13.0–17.0)
MCH: 31.7 pg (ref 26.0–34.0)
MCHC: 32.1 g/dL (ref 30.0–36.0)
MCV: 98.5 fL (ref 80.0–100.0)
Platelets: 179 10*3/uL (ref 150–400)
RBC: 3.98 MIL/uL — ABNORMAL LOW (ref 4.22–5.81)
RDW: 11.6 % (ref 11.5–15.5)
WBC: 3.5 10*3/uL — ABNORMAL LOW (ref 4.0–10.5)
nRBC: 0 % (ref 0.0–0.2)

## 2020-02-21 LAB — TROPONIN I (HIGH SENSITIVITY)
Troponin I (High Sensitivity): 4 ng/L (ref <18)
Troponin I (High Sensitivity): 4 ng/L (ref <18)

## 2020-02-21 LAB — BASIC METABOLIC PANEL
Anion gap: 7 (ref 5–15)
BUN: 8 mg/dL (ref 6–20)
CO2: 25 mmol/L (ref 22–32)
Calcium: 8.7 mg/dL — ABNORMAL LOW (ref 8.9–10.3)
Chloride: 107 mmol/L (ref 98–111)
Creatinine, Ser: 0.77 mg/dL (ref 0.61–1.24)
GFR calc Af Amer: >60 mL/min (ref >60)
GFR calc non Af Amer: >60 mL/min (ref >60)
Glucose, Bld: 108 mg/dL — ABNORMAL HIGH (ref 70–99)
Potassium: 3.4 mmol/L — ABNORMAL LOW (ref 3.5–5.1)
Sodium: 139 mmol/L (ref 135–145)

## 2020-02-21 MED ORDER — SODIUM CHLORIDE 0.9% FLUSH: 3.0000 mL | Freq: Once | INTRAVENOUS | Status: DC

## 2020-02-21 MED ORDER — DOXYCYCLINE HYCLATE 100 MG PO CAPS: 100.0000 mg | ORAL_CAPSULE | Freq: Two times a day (BID) | ORAL | 0 refills | Status: AC

## 2020-02-21 NOTE — ED Triage Notes (Signed)
Pt reports removing a tick from his back on Monday.  Tuesday he began to experience substernal chest pressure and headaches.  Denies fevers/chills/n/v/d.

## 2020-02-21 NOTE — Discharge Instructions (Addendum)
You have been seen here for chest pain and a tick bite.  Labs and imaging all look reassuring.  I prescribed you antibiotics for your Lyme's disease please take as prescribed.  This medication can make you more susceptible to sunburn please wear sunscreen as well as a hat to protect your face.  I have also recommend that you follow-up with your cardiologist in the next week as I feel you need close follow-up.  Please contact them at your earliest convenience.  I want to come back to the emergency department if you develop chest pain, shortness of breath, uncontrolled nausea, vomiting, diarrhea as the symptoms require further evaluation management.

## 2020-02-21 NOTE — ED Provider Notes (Signed)
[facility] EMERGENCY DEPARTMENT Provider Note   CSN: [id number] Arrival date & time: 02/21/20  0459     History Chief Complaint  Patient presents with  . Chest Pain  . tick bite    JAEVIN MEDEARIS is a 49 y.o. male.  HPI   Patient presents to the emergency department with chief complaint of a tick bite as well as intermittent chest pain that has been going on since last Monday.  Patient describes the chest pain as a pressure-like sensation along his sternum that comes and goes sporadically, and lasts about 1 minute and goes away on its own.  He admits to occasional shortness of breath which he feels is from the pain.  He states he is experience chest pain like this in the past and went for a stress test which slight decrease in ejection fraction.  Patient states he found a tick bite on his upper left shoulder last Monday.  He is unsure how long the tick was on him but admits that since the take he has had frequent headaches, and continued joint pain which is abnormal for him.  He denies any other neurological complaints.  Patient has significant medical history of GERD, pericarditis.  He denies fever, chills, sore throat, cough, chest pain, shortness of breath, abdominal pain, nausea, vomiting, pedal edema.  Past Medical History:  Diagnosis Date  . Abdominal pain    a. LLQ 11/2011  . GERD (gastroesophageal reflux disease)   . Gout   . Headache(784.0)   . Midsternal chest pain    a. Cardiac cath 12/10/11 with normal coronaries and LV systolic function;  09/5699 Echo EF 55-60%, no valvular abnormalities or pericardial effusion  . Pericarditis    a. presumed - 11/2011  . ST elevation    a. Anterior ST elevation ? early repolarization vs pericarditis    Patient Active Problem List   Diagnosis Date Noted  . Foot abscess 08/25/2016  . Gastroesophageal reflux disease 08/25/2016  . Gout 08/25/2016  . Midsternal chest pain   . ST elevation   . Abdominal pain   .  Chest pain 12/10/2011  . Bradycardia 12/10/2011    Past Surgical History:  Procedure Laterality Date  . CARDIAC CATHETERIZATION  12/10/11   normal coronaries and LV systolic function;  Echo with normal LV function, EF 55-60%, no valvular abnormalities or pericardial effusion  . I & D EXTREMITY Right 08/27/2016   Procedure: IRRIGATION AND DEBRIDEMENT RIGHT FOOT;  Surgeon: Edrick Kins, DPM;  Location: [place];  Service: Podiatry;  Laterality: Right;  . LEFT HEART CATHETERIZATION WITH CORONARY ANGIOGRAM N/A 12/10/2011   Procedure: LEFT HEART CATHETERIZATION WITH CORONARY ANGIOGRAM;  Surgeon: Larey Dresser, MD;  Location: [facility] CATH LAB;  Service: Cardiovascular;  Laterality: N/A;       Family History  Problem Relation Age of Onset  . Diabetes Maternal Grandmother   . Hypertension Maternal Grandmother   . Colon cancer Mother   . Hypertension Mother     Social History   Tobacco Use  . Smoking status: Former Smoker    Packs/day: 1.50    Years: 21.00    Pack years: 31.50    Types: Cigarettes    Quit date: 09/11/2002    Years since quitting: 17.4  . Smokeless tobacco: Never Used  Substance Use Topics  . Alcohol use: No  . Drug use: No    Frequency: 20.0 times per week    Types: Marijuana, Other-see comments  Comment: quit 1 month ago    Home Medications Prior to Admission medications   Medication Sig Start Date End Date Taking? Authorizing Provider  diclofenac sodium (VOLTAREN) 1 % GEL Apply 4 g topically 4 (four) times daily. Patient not taking: Reported on 02/11/2019 08/25/18   Arlean Hopping C, PA-C  doxycycline (VIBRAMYCIN) 100 MG capsule Take 1 capsule (100 mg total) by mouth 2 (two) times daily for 14 days. 02/21/20 03/06/20  Marcello Fennel, PA-C  ibuprofen (ADVIL,MOTRIN) 800 MG tablet Take 1 tablet (800 mg total) by mouth every 8 (eight) hours as needed. Patient not taking: Reported on 02/21/2020 02/21/16   Dalia Heading, PA-C    Allergies    Colchicine  Review of  Systems   Review of Systems  Constitutional: Negative for chills and fever.  HENT: Negative for congestion, sore throat and trouble swallowing.   Eyes: Negative for visual disturbance.  Respiratory: Positive for shortness of breath. Negative for cough.        Admits to occasional shortness of breath.  Cardiovascular: Positive for chest pain.       Admits to occasional chest pain  Gastrointestinal: Negative for abdominal pain, diarrhea, nausea and vomiting.  Genitourinary: Negative for enuresis.  Musculoskeletal: Positive for myalgias. Negative for back pain.  Skin: Negative for rash.  Neurological: Positive for headaches. Negative for dizziness, weakness and numbness.  Hematological: Does not bruise/bleed easily.    Physical Exam Updated Vital Signs BP (!) 118/89   Pulse (!) 42   Temp 97.8 F (36.6 C)   Resp 12   Ht 5\' 8"  (1.727 m)   Wt 77.1 kg   SpO2 100%   BMI 25.85 kg/m   Physical Exam Vitals and nursing note reviewed.  Constitutional:      General: He is not in acute distress.    Appearance: He is not ill-appearing.  HENT:     Head: Normocephalic and atraumatic.     Nose: No congestion.     Mouth/Throat:     Mouth: Mucous membranes are moist.     Pharynx: Oropharynx is clear.  Eyes:     General: No scleral icterus.    Pupils: Pupils are equal, round, and reactive to light.  Cardiovascular:     Rate and Rhythm: Normal rate and regular rhythm.     Pulses: Normal pulses.     Heart sounds: No murmur heard.  No friction rub. No gallop.   Pulmonary:     Effort: No respiratory distress.     Breath sounds: No wheezing, rhonchi or rales.  Abdominal:     General: There is no distension.     Palpations: Abdomen is soft. There is no mass.     Tenderness: There is abdominal tenderness. There is no guarding.     Hernia: No hernia is present.     Comments: Patient's abdomen was visualized, no surgical scars noted, none distended, normoactive bowel sounds, dull to  percussion, slight epigastric pain upon palpation, no signs of acute abdomen.  Musculoskeletal:        General: No swelling.     Right lower leg: No edema.     Left lower leg: No edema.  Skin:    General: Skin is warm and dry.     Capillary Refill: Capillary refill takes less than 2 seconds.     Findings: No rash.  Neurological:     Mental Status: He is alert and oriented to person, place, and time.  Psychiatric:  Mood and Affect: Mood normal.     ED Results / Procedures / Treatments   Labs (all labs ordered are listed, but only abnormal results are displayed) Labs Reviewed  BASIC METABOLIC PANEL - Abnormal; Notable for the following components:      Result Value   Potassium 3.4 (*)    Glucose, Bld 108 (*)    Calcium 8.7 (*)    All other components within normal limits  CBC - Abnormal; Notable for the following components:   WBC 3.5 (*)    RBC 3.98 (*)    Hemoglobin 12.6 (*)    All other components within normal limits  TROPONIN I (HIGH SENSITIVITY)  TROPONIN I (HIGH SENSITIVITY)    EKG EKG Interpretation  Date/Time:  Monday February 21 2020 05:06:39 EDT Ventricular Rate:  58 PR Interval:  138 QRS Duration: 94 QT Interval:  404 QTC Calculation: 396 R Axis:   76 Text Interpretation: Sinus bradycardia Minimal voltage criteria for LVH, may be normal variant ( Sokolow-Lyon ) Early repolarization Borderline ECG Similar to prior. No STEMI Confirmed by Nanda Quinton [phone]) on 02/21/2020 5:11:46 AM Also confirmed by Gareth Morgan [phone])  on 02/21/2020 11:28:55 AM Also confirmed by Gareth Morgan [phone]), editor Hattie Perch [phone])  on 02/21/2020 1:58:12 PM   Radiology DG Chest 2 View  Result Date: 02/21/2020 CLINICAL DATA:  Chest pain EXAM: CHEST - 2 VIEW COMPARISON:  October 13, 2015 FINDINGS: The lungs are clear. The heart size and pulmonary vascularity are normal. No adenopathy. There is mild degenerative change in the thoracic spine. IMPRESSION: Lungs clear.   Cardiac silhouette within normal limits. Electronically Signed   By: Lowella Grip III M.D.   On: 02/21/2020 05:25    Procedures Procedures (including critical care time)  Medications Ordered in ED Medications  sodium chloride flush (NS) 0.9 % injection 3 mL (3 mLs Intravenous Not Given 02/21/20 1121)    ED Course  I have reviewed the triage vital signs and the nursing notes.  Pertinent labs & imaging results that were available during my care of the patient were reviewed by me and considered in my medical decision making (see chart for details).    MDM Rules/Calculators/A&P                          I have personally reviewed all imaging, labs and have interpreted them.  Due to patient presentation concern for MI versus PE versus pneumonia versus metabolic abnormality.  I have low suspicion for MI or cardiac abnormality as patient's description of chest pain is atypical intermediate pain that last minute and goes away on its own, initial troponin was 4, second troponin was 4 making a delta troponin of 0, EKG showed sinus bradycardia without signs of ischemia.  Unlikely patient suffering from a heart block as EKG showed sinus bradycardia, when patient was not sleeping and talking with me her rate was mid 50s low 60s.  Low suspicion for pneumonia as chest x-ray did not show any acute abnormalities, no edema, consolidation, infiltrates which is consistent with physical exam of clear lung sounds bilaterally.  I have low suspicion for PE as he does not meet Wells criteria for pulmonary embolism, denies pleuritic chest pain, pedal edema, recent surgeries, hormone use, smoking.  Unlikely patient suffering from a metabolic abnormality as BMP does not show electrolyte abnormalities, no signs of AKI.  Unlikely patient suffering from a systemic infection as patient is nontoxic-appearing, CBC does not  show leukocytosis, shows slight normocytic anemia.  I have low suspicion for Lyme's disease as there is  no bull's-eye rash noted on patient but patient did complain of joint pain and headaches we will treat him for possible Lyme disease.  Patient appears to be resting comfortably in bed showing no acute signs stress.  Vital signs remained stable does not meet criteria to be admitted to the hospital.  Patient's chest pain differential includes acid reflux versus anxiety versus muscle strain versus cardiac abnormality recommend follow-up with cardiology for further evaluation management.  Patient will be sent home with antibiotics treatment for Lyme.  Patient discussed with attending who agrees with assessment and plan.  Patient was given at home care as well as strict return precautions.  Patient verbalized that he understood and agreed with said plan. Final Clinical Impression(s) / ED Diagnoses Final diagnoses:  Tick bite, initial encounter  Atypical chest pain    Rx / DC Orders ED Discharge Orders         Ordered    doxycycline (VIBRAMYCIN) 100 MG capsule  2 times daily     Discontinue  Reprint     02/21/20 1141           Marcello Fennel, PA-C 02/21/20 1402    Gareth Morgan, MD 02/21/20 2307

## 2020-03-14 ENCOUNTER — Telehealth: Payer: Self-pay

## 2020-03-14 NOTE — Telephone Encounter (Signed)
   Primary Cardiologist:  Previously Dr. Aundra Dubin - last seen by [name], NP at [facility]  Chart reviewed as part of pre-operative protocol coverage. Because of Chris Fox's past medical history and time since last visit, they will require a follow-up visit in order to better assess preoperative cardiovascular risk.  Pre-op covering staff: - Please schedule appointment and call patient to inform them. - Please add "pre-op clearance" to the appointment notes so provider is aware. - Please contact requesting surgeon's office via preferred method (i.e, phone, fax) to inform them of need for appointment prior to surgery.  [name], PA-C  03/14/2020, 4:33 PM

## 2020-03-14 NOTE — Telephone Encounter (Signed)
   [facility] Pre-operative Risk Assessment    HEARTCARE STAFF: - Please ensure there is not already an duplicate clearance open for this procedure. - Under Visit Info/Reason for Call, type in Other and utilize the format Clearance MM/DD/YY or Clearance TBD. Do not use dashes or single digits. - If request is for dental extraction, please clarify the # of teeth to be extracted.  Request for surgical clearance:  1. What type of surgery is being performed? Left total knee replacement  2. When is this surgery scheduled? tbd  3. What type of clearance is required (medical clearance vs. Pharmacy clearance to hold med vs. Both)? both  4. Are there any medications that need to be held prior to surgery and how long? Not listed  5. Practice name and name of physician performing surgery? [facility]- Dr. Elsie Saas  6. What is the office phone number? [phone]   7.   What is the office fax number? [phone]  8.   Anesthesia type (None, local, MAC, general) ? choice   Darlyn Chamber Pharoah Goggins 03/14/2020, 9:47 AM  _________________________________________________________________   (provider comments below)

## 2020-03-14 NOTE — Telephone Encounter (Signed)
Patient needs appointment for clearance. Lvm

## 2020-03-15 NOTE — Telephone Encounter (Signed)
Attempted to call patient to get him set up for an appointment for his procedure I did not get an answer and could not leave a voice message. Will route to scheduling to help with getting patient scheduled with Dr. Aundra Dubin or [name], NP since he was last seen in 2019 by [name]

## 2020-05-08 ENCOUNTER — Encounter: Payer: Self-pay | Admitting: Gastroenterology

## 2020-05-12 ENCOUNTER — Ambulatory Visit (HOSPITAL_COMMUNITY)
Admission: EM | Admit: 2020-05-12 | Discharge: 2020-05-12 | Disposition: A | Discharge status: Home / Self Care | Payer: PRIVATE HEALTH INSURANCE

## 2020-05-12 ENCOUNTER — Other Ambulatory Visit: Payer: Self-pay

## 2020-05-12 ENCOUNTER — Encounter (HOSPITAL_COMMUNITY): Payer: Self-pay

## 2020-05-12 DIAGNOSIS — R079 Chest pain, unspecified: Secondary | ICD-10-CM | POA: Diagnosis not present

## 2020-05-12 NOTE — ED Triage Notes (Signed)
Pt reports having right-sided chest pain and right sided back pain x 1 day. Pain is worse when taking a deep breath or getting up from  A chair. Denies nausea, jaw pain, diarrhea, numbness or tingling in arms. Denies new exercises, recreational drugs, heavy lifting.

## 2020-05-12 NOTE — ED Provider Notes (Signed)
Emergency Department Provider Note  ____________________________________________  Time seen: Approximately 3:40 PM  I have reviewed the triage vital signs and the nursing notes.   HISTORY  Chief Complaint Chest Pain and Back Pain   Historian Patient     HPI Chris Fox is a 49 y.o. male with a history of prior cardiac cath in 2013 and pericarditis, presents to the emergency department with pleuritic, right-sided chest pain that radiates to the back for 1 day.  Patient states that pain does not change with exertion.  He denies associated shortness of breath or chest tightness.  Patient denies daily smoking, prolonged immobilization or recent surgery.  He denies swelling of the lower extremities.  States that he has a family history of cardiac issues.  No history of hyperlipidemia.  Denies prior history of DVT or PE.  States that he does not have to take any medications for hypertension.   Past Medical History:  Diagnosis Date  . Abdominal pain    a. LLQ 11/2011  . GERD (gastroesophageal reflux disease)   . Gout   . Headache(784.0)   . Midsternal chest pain    a. Cardiac cath 12/10/11 with normal coronaries and LV systolic function;  07/256 Echo EF 55-60%, no valvular abnormalities or pericardial effusion  . Pericarditis    a. presumed - 11/2011  . ST elevation    a. Anterior ST elevation ? early repolarization vs pericarditis     Immunizations up to date:  Yes.     Past Medical History:  Diagnosis Date  . Abdominal pain    a. LLQ 11/2011  . GERD (gastroesophageal reflux disease)   . Gout   . Headache(784.0)   . Midsternal chest pain    a. Cardiac cath 12/10/11 with normal coronaries and LV systolic function;  11/2776 Echo EF 55-60%, no valvular abnormalities or pericardial effusion  . Pericarditis    a. presumed - 11/2011  . ST elevation    a. Anterior ST elevation ? early repolarization vs pericarditis    Patient Active Problem List   Diagnosis Date Noted   . Foot abscess 08/25/2016  . Gastroesophageal reflux disease 08/25/2016  . Gout 08/25/2016  . Midsternal chest pain   . ST elevation   . Abdominal pain   . Chest pain 12/10/2011  . Bradycardia 12/10/2011    Past Surgical History:  Procedure Laterality Date  . CARDIAC CATHETERIZATION  12/10/11   normal coronaries and LV systolic function;  Echo with normal LV function, EF 55-60%, no valvular abnormalities or pericardial effusion  . I & D EXTREMITY Right 08/27/2016   Procedure: IRRIGATION AND DEBRIDEMENT RIGHT FOOT;  Surgeon: Edrick Kins, DPM;  Location: [place];  Service: Podiatry;  Laterality: Right;  . LEFT HEART CATHETERIZATION WITH CORONARY ANGIOGRAM N/A 12/10/2011   Procedure: LEFT HEART CATHETERIZATION WITH CORONARY ANGIOGRAM;  Surgeon: Larey Dresser, MD;  Location: [facility] CATH LAB;  Service: Cardiovascular;  Laterality: N/A;    Prior to Admission medications   Medication Sig Start Date End Date Taking? Authorizing Provider  diclofenac sodium (VOLTAREN) 1 % GEL Apply 4 g topically 4 (four) times daily. Patient not taking: Reported on 02/11/2019 08/25/18   Lorayne Bender, PA-C  ibuprofen (ADVIL,MOTRIN) 800 MG tablet Take 1 tablet (800 mg total) by mouth every 8 (eight) hours as needed. Patient not taking: Reported on 02/21/2020 02/21/16   Dalia Heading, PA-C    Allergies Colchicine  Family History  Problem Relation Age of Onset  . Diabetes  Maternal Grandmother   . Hypertension Maternal Grandmother   . Colon cancer Mother   . Hypertension Mother     Social History Social History   Tobacco Use  . Smoking status: Former Smoker    Packs/day: 1.50    Years: 21.00    Pack years: 31.50    Types: Cigarettes    Quit date: 09/11/2002    Years since quitting: 17.6  . Smokeless tobacco: Never Used  Substance Use Topics  . Alcohol use: No  . Drug use: No    Frequency: 20.0 times per week    Types: Marijuana, Other-see comments    Comment: quit 1 month ago     Review of  Systems  Constitutional: No fever/chills Eyes:  No discharge ENT: No upper respiratory complaints. Respiratory: no cough. No SOB/ use of accessory muscles to breath. Cardiac:  Patient has pleuritic right sided chest pain.  Gastrointestinal:   No nausea, no vomiting.  No diarrhea.  No constipation. Musculoskeletal: Negative for musculoskeletal pain. Skin: Negative for rash, abrasions, lacerations, ecchymosis.    ____________________________________________   PHYSICAL EXAM:  VITAL SIGNS: ED Triage Vitals  Enc Vitals Group     BP 05/12/20 1513 119/70     Pulse Rate 05/12/20 1513 62     Resp 05/12/20 1513 17     Temp 05/12/20 1513 98.4 F (36.9 C)     Temp Source 05/12/20 1513 Oral     SpO2 05/12/20 1513 96 %     Weight --      Height --      Head Circumference --      Peak Flow --      Pain Score 05/12/20 1511 6     Pain Loc --      Pain Edu? --      Excl. in [place]? --      Constitutional: Alert and oriented. Well appearing and in no acute distress. Eyes: Conjunctivae are normal. PERRL. EOMI. Head: Atraumatic. Cardiovascular: Normal rate, regular rhythm. Normal S1 and S2.  Good peripheral circulation. Respiratory: Normal respiratory effort without tachypnea or retractions. Lungs CTAB. Good air entry to the bases with no decreased or absent breath sounds Gastrointestinal: Bowel sounds x 4 quadrants. Soft and nontender to palpation. No guarding or rigidity. No distention. Musculoskeletal: Full range of motion to all extremities. No obvious deformities noted Neurologic:  Normal for age. No gross focal neurologic deficits are appreciated.  Skin:  Skin is warm, dry and intact. No rash noted. Psychiatric: Mood and affect are normal for age. Speech and behavior are normal.   ____________________________________________   LABS (all labs ordered are listed, but only abnormal results are displayed)  Labs Reviewed - No data to  display ____________________________________________  EKG   ____________________________________________  RADIOLOGY   No results found.  ____________________________________________    PROCEDURES  Procedure(s) performed:     Procedures     Medications - No data to display   ____________________________________________   INITIAL IMPRESSION / ASSESSMENT AND PLAN / ED COURSE  Pertinent labs & imaging results that were available during my care of the patient were reviewed by me and considered in my medical decision making (see chart for details).      Assessment and Plan:  Pleuritic chest pain 49 year old male presents to the emergency department with right-sided pleuritic chest pain that radiates to the back for 1 day.  Vital signs were reassuring at triage.  On physical exam, patient was resting comfortably in no apparent distress  with no increased work of breathing  Differential diagnosis includes pleuritis, community-acquired pneumonia, ST segment elevation, pericarditis, PE...  Patient PERC score is 1 and PE cannot be ruled out.  Patient was referred to the emergency department for further care and management.    ____________________________________________  FINAL CLINICAL IMPRESSION(S) / ED DIAGNOSES  Final diagnoses:  Chest pain, unspecified type      NEW MEDICATIONS STARTED DURING THIS VISIT:  ED Discharge Orders    None          This chart was dictated using voice recognition software/Dragon. Despite best efforts to proofread, errors can occur which can change the meaning. Any change was purely unintentional.     Lannie Fields, PA-C 05/12/20 1546

## 2020-05-12 NOTE — Discharge Instructions (Signed)
Please go straight to emergency department for further care and management for chest pain.

## 2020-05-31 ENCOUNTER — Ambulatory Visit: Payer: Self-pay | Admitting: Gastroenterology

## 2020-06-20 ENCOUNTER — Encounter: Payer: Self-pay | Admitting: Gastroenterology

## 2020-06-20 ENCOUNTER — Ambulatory Visit (INDEPENDENT_AMBULATORY_CARE_PROVIDER_SITE_OTHER): Payer: PRIVATE HEALTH INSURANCE | Admitting: Gastroenterology

## 2020-06-20 ENCOUNTER — Other Ambulatory Visit (INDEPENDENT_AMBULATORY_CARE_PROVIDER_SITE_OTHER): Payer: PRIVATE HEALTH INSURANCE

## 2020-06-20 VITALS — BP 122/80 | HR 73 | Ht 67.0 in | Wt 161.0 lb

## 2020-06-20 DIAGNOSIS — D539 Nutritional anemia, unspecified: Secondary | ICD-10-CM | POA: Diagnosis not present

## 2020-06-20 DIAGNOSIS — Z800 Family history of malignant neoplasm of digestive organs: Secondary | ICD-10-CM | POA: Diagnosis not present

## 2020-06-20 DIAGNOSIS — K625 Hemorrhage of anus and rectum: Secondary | ICD-10-CM | POA: Diagnosis not present

## 2020-06-20 LAB — CBC WITH DIFFERENTIAL/PLATELET
Basophils Absolute: 0.1 K/uL (ref 0.0–0.1)
Basophils Relative: 1.1 % (ref 0.0–3.0)
Eosinophils Absolute: 0.2 K/uL (ref 0.0–0.7)
Eosinophils Relative: 3.5 % (ref 0.0–5.0)
HCT: 39 % (ref 39.0–52.0)
Hemoglobin: 12.8 g/dL — ABNORMAL LOW (ref 13.0–17.0)
Lymphocytes Relative: 37.1 % (ref 12.0–46.0)
Lymphs Abs: 1.8 K/uL (ref 0.7–4.0)
MCHC: 32.9 g/dL (ref 30.0–36.0)
MCV: 94.9 fl (ref 78.0–100.0)
Monocytes Absolute: 0.4 K/uL (ref 0.1–1.0)
Monocytes Relative: 8.8 % (ref 3.0–12.0)
Neutro Abs: 2.4 K/uL (ref 1.4–7.7)
Neutrophils Relative %: 49.5 % (ref 43.0–77.0)
Platelets: 185 K/uL (ref 150.0–400.0)
RBC: 4.11 Mil/uL — ABNORMAL LOW (ref 4.22–5.81)
RDW: 12.3 % (ref 11.5–15.5)
WBC: 4.9 K/uL (ref 4.0–10.5)

## 2020-06-20 LAB — IBC PANEL
Iron: 143 ug/dL (ref 42–165)
Saturation Ratios: 36 % (ref 20.0–50.0)
Transferrin: 284 mg/dL (ref 212.0–360.0)

## 2020-06-20 LAB — FERRITIN: Ferritin: 24.7 ng/mL (ref 22.0–322.0)

## 2020-06-20 MED ORDER — NA SULFATE-K SULFATE-MG SULF 17.5-3.13-1.6 GM/177ML PO SOLN
1.0000 | Freq: Once | ORAL | 0 refills | Status: AC
Start: 2020-06-20 — End: 2020-06-20

## 2020-06-20 NOTE — Progress Notes (Signed)
Attending Physician's Attestation   I have reviewed the chart.   I agree with the Advanced Practitioner's note, impression, and recommendations with any updates as below.    Charrise Lardner Mansouraty, MD [facility] Advanced [facility]oscopy Office # [phone]  

## 2020-06-20 NOTE — Progress Notes (Signed)
06/20/2020 Chris Fox [id number] 1970-09-11   HISTORY OF PRESENT ILLNESS:  This is a 49 year old male who is new to our office.  He has been referred here by his PCP, Earle Gell, FNP, for evaluation regarding family history of colon cancer and rectal bleeding.  He tells me that his mother had colon cancer twice and he also has a family history of colon cancer in his maternal grandfather and a great uncle on his mother side as well.  He reports having a colonoscopy he thinks probably about 3 or so years ago.  Sounds like probably [facility].  From what he recalls it was normal.  He tells me that he has intermittent rectal bleeding.  Describes this as moderate to large amounts at times.  He says that it comes and goes.  Will usually last a couple of days before resolving on its own.  He has not seen any blood now in a couple of months.  He denies any issues with moving his bowels including constipation or diarrhea.  No abdominal pain.  No rectal discomfort.  Says his appetite is good.  Hgb 11.7 grams.  MCV is 98.4.  Past Medical History:  Diagnosis Date  . Abdominal pain    a. LLQ 11/2011  . GERD (gastroesophageal reflux disease)   . Gout   . Headache(784.0)   . Midsternal chest pain    a. Cardiac cath 12/10/11 with normal coronaries and LV systolic function;  07/6071 Echo EF 55-60%, no valvular abnormalities or pericardial effusion  . Pericarditis    a. presumed - 11/2011  . ST elevation    a. Anterior ST elevation ? early repolarization vs pericarditis   Past Surgical History:  Procedure Laterality Date  . CARDIAC CATHETERIZATION  12/10/11   normal coronaries and LV systolic function;  Echo with normal LV function, EF 55-60%, no valvular abnormalities or pericardial effusion  . I & D EXTREMITY Right 08/27/2016   Procedure: IRRIGATION AND DEBRIDEMENT RIGHT FOOT;  Surgeon: Edrick Kins, DPM;  Location: [place];  Service: Podiatry;  Laterality: Right;  . LEFT HEART CATHETERIZATION WITH  CORONARY ANGIOGRAM N/A 12/10/2011   Procedure: LEFT HEART CATHETERIZATION WITH CORONARY ANGIOGRAM;  Surgeon: Larey Dresser, MD;  Location: [facility] CATH LAB;  Service: Cardiovascular;  Laterality: N/A;    reports that he quit smoking about 17 years ago. His smoking use included cigarettes. He has a 31.50 pack-year smoking history. He has never used smokeless tobacco. He reports that he does not drink alcohol and does not use drugs. family history includes Colon cancer in his maternal grandfather and mother; Diabetes in his maternal grandmother; Hypertension in his maternal grandmother and mother. Allergies  Allergen Reactions  . Colchicine Other (See Comments)    GI pain      Outpatient Encounter Medications as of 06/20/2020  Medication Sig  . acetaminophen (TYLENOL) 325 MG tablet Take 650 mg by mouth every 6 (six) hours as needed.  Marland Kitchen ibuprofen (ADVIL,MOTRIN) 800 MG tablet Take 1 tablet (800 mg total) by mouth every 8 (eight) hours as needed.  . [DISCONTINUED] diclofenac sodium (VOLTAREN) 1 % GEL Apply 4 g topically 4 (four) times daily. (Patient not taking: Reported on 02/11/2019)   No facility-administered encounter medications on file as of 06/20/2020.    REVIEW OF SYSTEMS  : All other systems reviewed and negative except where noted in the History of Present Illness.   PHYSICAL EXAM: BP 122/80   Pulse 73  Ht 5\' 7"  (1.702 m)   Wt 161 lb (73 kg)   BMI 25.22 kg/m  General: Well developed AA male in no acute distress Head: Normocephalic and atraumatic Eyes:  Sclerae anicteric, conjunctiva pink. Ears: Normal auditory acuity Lungs: Clear throughout to auscultation; no W/R/R. Heart: Regular rate and rhythm; no M/R/G Abdomen: Soft, non-distended.  BS present.  Non-tender. Rectal:  Will be done at the time of colonoscopy. Musculoskeletal: Symmetrical with no gross deformities  Skin: No lesions on visible extremities Extremities: No edema  Neurological: Alert oriented x 4, grossly  non-focal Psychological:  Alert and cooperative. Normal mood and affect  ASSESSMENT AND PLAN: *Family history of colon cancer in his mother x2, maternal grandfather, and a great uncle on his mom's side. *Rectal bleeding: He describes this as being intermittent, but moderate to large volume at times.  No bleeding in the past 1 to 2 months.  Resolves spontaneously after a couple of days. *Macrocytic anemia:  Hemoglobin was 11.7 g in September with an MCV of 98.4.    -We will plan for colonoscopy with Dr. Rush Landmark.  The risks, benefits, and alternatives to colonoscopy were discussed with the patient and he consents to proceed.  -We will recheck a CBC along with iron studies, B12, and folate today. -We will try to obtain previous colonoscopy records from [facility].  CC:  Geoffery Lyons, NP

## 2020-06-20 NOTE — Patient Instructions (Signed)
If you are age 50 or older, your body mass index should be between 23-30. Your Body mass index is 25.22 kg/m. If this is out of the aforementioned range listed, please consider follow up with your Primary Care Provider.  If you are age 63 or younger, your body mass index should be between 19-25. Your Body mass index is 25.22 kg/m. If this is out of the aformentioned range listed, please consider follow up with your Primary Care Provider.   Your provider has requested that you go to the basement level for lab work before leaving today. Press "B" on the elevator. The lab is located at the first door on the left as you exit the elevator.  You have been scheduled for a colonoscopy. Please follow written instructions given to you at your visit today.  Please pick up your prep supplies at the pharmacy within the next 1-3 days. If you use inhalers (even only as needed), please bring them with you on the day of your procedure.

## 2020-06-21 ENCOUNTER — Other Ambulatory Visit (INDEPENDENT_AMBULATORY_CARE_PROVIDER_SITE_OTHER): Payer: PRIVATE HEALTH INSURANCE

## 2020-06-21 DIAGNOSIS — K625 Hemorrhage of anus and rectum: Secondary | ICD-10-CM | POA: Diagnosis not present

## 2020-06-21 DIAGNOSIS — Z800 Family history of malignant neoplasm of digestive organs: Secondary | ICD-10-CM | POA: Diagnosis not present

## 2020-06-21 DIAGNOSIS — D539 Nutritional anemia, unspecified: Secondary | ICD-10-CM | POA: Diagnosis not present

## 2020-06-21 LAB — B12 AND FOLATE PANEL
Folate: 13.9 ng/mL (ref >5.9)
Vitamin B-12: 316 pg/mL (ref 211–911)

## 2020-06-21 NOTE — Addendum Note (Signed)
Addended by: Horris Latino on: 06/21/2020 08:29 AM   Modules accepted: Orders

## 2020-07-05 ENCOUNTER — Ambulatory Visit (HOSPITAL_COMMUNITY)
Admission: EM | Admit: 2020-07-05 | Discharge: 2020-07-05 | Disposition: A | Discharge status: Home / Self Care | Payer: PRIVATE HEALTH INSURANCE | Attending: Urgent Care | Admitting: Urgent Care

## 2020-07-05 ENCOUNTER — Other Ambulatory Visit: Payer: Self-pay

## 2020-07-05 ENCOUNTER — Encounter (HOSPITAL_COMMUNITY): Payer: Self-pay | Admitting: Emergency Medicine

## 2020-07-05 DIAGNOSIS — M25562 Pain in left knee: Secondary | ICD-10-CM | POA: Diagnosis not present

## 2020-07-05 DIAGNOSIS — M1990 Unspecified osteoarthritis, unspecified site: Secondary | ICD-10-CM

## 2020-07-05 DIAGNOSIS — M25561 Pain in right knee: Secondary | ICD-10-CM | POA: Diagnosis not present

## 2020-07-05 MED ORDER — PREDNISONE 20 MG PO TABS
ORAL_TABLET | ORAL | 0 refills | Status: DC
Start: 2020-07-05 — End: 2020-07-25

## 2020-07-05 NOTE — ED Triage Notes (Signed)
Pt presents with bilateral knee pain. States is supposed to get bilateral replacements but has not scheduled surgery. States is taking ibuprofen around the clock with no relief. States knee caps are burning and front of shins are hurting.

## 2020-07-05 NOTE — ED Provider Notes (Signed)
[place]   MRN: [id number] DOB: 10-Feb-1971  Subjective:   Chris Fox is a 49 y.o. male presenting for acute onset of recurrent and persistent bilateral knee pain. Patient has consistently used APAP, ibuprofen with very temporary relief. Has a hx of OA for both knees and has been advised he needs knee replacements but has not scheduled this. He has difficulty living on is own and has a lot of financial restraints. Has a hx of ST elevation, pericarditis. No active chest pain, diabetes, belly pain, fall, trauma. Has a hx of gout.   No current facility-administered medications for this encounter.  Current Outpatient Medications:  .  acetaminophen (TYLENOL) 325 MG tablet, Take 650 mg by mouth every 6 (six) hours as needed., Disp: , Rfl:  .  ibuprofen (ADVIL,MOTRIN) 800 MG tablet, Take 1 tablet (800 mg total) by mouth every 8 (eight) hours as needed., Disp: 30 tablet, Rfl: 0   Allergies  Allergen Reactions  . Colchicine Other (See Comments)    GI pain    Past Medical History:  Diagnosis Date  . Abdominal pain    a. LLQ 11/2011  . GERD (gastroesophageal reflux disease)   . Gout   . Headache(784.0)   . Midsternal chest pain    a. Cardiac cath 12/10/11 with normal coronaries and LV systolic function;  02/7563 Echo EF 55-60%, no valvular abnormalities or pericardial effusion  . Pericarditis    a. presumed - 11/2011  . ST elevation    a. Anterior ST elevation ? early repolarization vs pericarditis     Past Surgical History:  Procedure Laterality Date  . CARDIAC CATHETERIZATION  12/10/11   normal coronaries and LV systolic function;  Echo with normal LV function, EF 55-60%, no valvular abnormalities or pericardial effusion  . I & D EXTREMITY Right 08/27/2016   Procedure: IRRIGATION AND DEBRIDEMENT RIGHT FOOT;  Surgeon: Edrick Kins, DPM;  Location: [place];  Service: Podiatry;  Laterality: Right;  . LEFT HEART CATHETERIZATION WITH CORONARY ANGIOGRAM N/A 12/10/2011    Procedure: LEFT HEART CATHETERIZATION WITH CORONARY ANGIOGRAM;  Surgeon: Larey Dresser, MD;  Location: [facility] CATH LAB;  Service: Cardiovascular;  Laterality: N/A;    Family History  Problem Relation Age of Onset  . Diabetes Maternal Grandmother   . Hypertension Maternal Grandmother   . Colon cancer Mother   . Hypertension Mother   . Colon cancer Maternal Grandfather     Social History   Tobacco Use  . Smoking status: Former Smoker    Packs/day: 1.50    Years: 21.00    Pack years: 31.50    Types: Cigarettes    Quit date: 09/11/2002    Years since quitting: 17.8  . Smokeless tobacco: Never Used  Vaping Use  . Vaping Use: Never used  Substance Use Topics  . Alcohol use: No  . Drug use: No    Frequency: 20.0 times per week    Types: Marijuana, Other-see comments    Comment: quit 1 month ago    ROS   Objective:   Vitals: BP 118/77 (BP Location: Right Arm)   Pulse (!) 57   Temp 97.9 F (36.6 C) (Oral)   Resp 16   SpO2 100%   Physical Exam Constitutional:      General: He is not in acute distress.    Appearance: Normal appearance. He is well-developed and normal weight. He is not ill-appearing, toxic-appearing or diaphoretic.  HENT:     Head:  Normocephalic and atraumatic.     Right Ear: External ear normal.     Left Ear: External ear normal.     Nose: Nose normal.     Mouth/Throat:     Pharynx: Oropharynx is clear.  Eyes:     General: No scleral icterus.       Right eye: No discharge.        Left eye: No discharge.     Extraocular Movements: Extraocular movements intact.     Pupils: Pupils are equal, round, and reactive to light.  Cardiovascular:     Rate and Rhythm: Normal rate.  Pulmonary:     Effort: Pulmonary effort is normal.  Musculoskeletal:     Cervical back: Normal range of motion.     Comments: Bilateral knee pain mostly over patellar tendon and posterior knees, L>R. Trace swelling but no warmth or erythema.   Skin:    General: Skin is warm  and dry.  Neurological:     Mental Status: He is alert and oriented to person, place, and time.  Psychiatric:        Mood and Affect: Mood normal.        Behavior: Behavior normal.        Thought Content: Thought content normal.        Judgment: Judgment normal.      Assessment and Plan :   PDMP not reviewed this encounter.  1. Acute pain of both knees   2. Arthritis     Emphasized need for f/u and scheduling his bilateral knee replacement. Stop NSAID use, continue APAP, add 5 day course of prednisone. Counseled patient on potential for adverse effects with medications prescribed/recommended today, ER and return-to-clinic precautions discussed, patient verbalized understanding.    Jaynee Eagles, [place] 07/05/20 1237

## 2020-07-25 ENCOUNTER — Ambulatory Visit (AMBULATORY_SURGERY_CENTER): Payer: PRIVATE HEALTH INSURANCE | Admitting: Gastroenterology

## 2020-07-25 ENCOUNTER — Other Ambulatory Visit: Payer: Self-pay

## 2020-07-25 ENCOUNTER — Encounter: Payer: Self-pay | Admitting: Gastroenterology

## 2020-07-25 VITALS — BP 109/52 | HR 49 | Temp 98.0°F | Resp 18 | Ht 67.0 in | Wt 161.0 lb

## 2020-07-25 DIAGNOSIS — D126 Benign neoplasm of colon, unspecified: Secondary | ICD-10-CM | POA: Diagnosis not present

## 2020-07-25 DIAGNOSIS — D539 Nutritional anemia, unspecified: Secondary | ICD-10-CM

## 2020-07-25 DIAGNOSIS — K625 Hemorrhage of anus and rectum: Secondary | ICD-10-CM | POA: Diagnosis present

## 2020-07-25 DIAGNOSIS — K641 Second degree hemorrhoids: Secondary | ICD-10-CM

## 2020-07-25 DIAGNOSIS — K5730 Diverticulosis of large intestine without perforation or abscess without bleeding: Secondary | ICD-10-CM

## 2020-07-25 DIAGNOSIS — D123 Benign neoplasm of transverse colon: Secondary | ICD-10-CM

## 2020-07-25 DIAGNOSIS — K921 Melena: Secondary | ICD-10-CM | POA: Diagnosis not present

## 2020-07-25 MED ORDER — SODIUM CHLORIDE 0.9 % IV SOLN: 500.0000 mL | Freq: Once | INTRAVENOUS | Status: DC

## 2020-07-25 NOTE — Op Note (Signed)
[place] Patient Name: Chris Fox Procedure Date: 07/25/2020 10:49 AM MRN: [id number] Endoscopist: Justice Britain , MD Age: 49 Referring MD:  Date of Birth: 11-30-1970 Gender: Male Account #: [id number] Procedure:                Colonoscopy Indications:              Hematochezia, Rectal bleeding Medicines:                Monitored Anesthesia Care Procedure:                Pre-Anesthesia Assessment:                           - Prior to the procedure, a History and Physical                            was performed, and patient medications and                            allergies were reviewed. The patient's tolerance of                            previous anesthesia was also reviewed. The risks                            and benefits of the procedure and the sedation                            options and risks were discussed with the patient.                            All questions were answered, and informed consent                            was obtained. Prior Anticoagulants: The patient has                            taken no previous anticoagulant or antiplatelet                            agents except for NSAID medication. ASA Grade                            Assessment: II - A patient with mild systemic                            disease. After reviewing the risks and benefits,                            the patient was deemed in satisfactory condition to                            undergo the procedure.  After obtaining informed consent, the colonoscope                            was passed under direct vision. Throughout the                            procedure, the patient's blood pressure, pulse, and                            oxygen saturations were monitored continuously. The                            Olympus PCF-H190DL ([id number]) Colonoscope was                            introduced through the anus and advanced to the 6                             cm into the ileum. The colonoscopy was performed                            without difficulty. The patient tolerated the                            procedure. The quality of the bowel preparation was                            good. The terminal ileum, ileocecal valve,                            appendiceal orifice, and rectum were photographed. Scope In: 11:02:02 AM Scope Out: 11:18:07 AM Scope Withdrawal Time: 0 hours 12 minutes 56 seconds  Total Procedure Duration: 0 hours 16 minutes 5 seconds  Findings:                 The digital rectal exam findings include                            hemorrhoids. Pertinent negatives include no                            palpable rectal lesions.                           The terminal ileum and ileocecal valve appeared                            normal.                           Multiple small-mouthed diverticula were found in                            the transverse colon, hepatic flexure and ascending  colon.                           Three sessile polyps were found in the splenic                            flexure (1) and transverse colon (2). The polyps                            were 3 to 5 mm in size. These polyps were removed                            with a cold snare. Resection and retrieval were                            complete.                           A scattered area of mildly erythematous mucosa was                            found in the recto-sigmoid colon, in the sigmoid                            colon and in the descending colon. This was                            biopsied with a cold forceps for histology to rule                            out chronic colitis.                           Normal mucosa was found in the entire colon                            otherwise.                           Non-bleeding non-thrombosed external and internal                             hemorrhoids were found during retroflexion, during                            perianal exam and during digital exam. The                            hemorrhoids were Grade II (internal hemorrhoids                            that prolapse but reduce spontaneously). Complications:            No immediate complications. Estimated Blood Loss:     Estimated blood loss was minimal. Impression:               -  Hemorrhoids found on digital rectal exam.                           - The examined portion of the ileum was normal.                           - Diverticulosis in the transverse colon, at the                            hepatic flexure and in the ascending colon.                           - Thre, 3 to 5 mm polyps at the splenic flexure and                            in the transverse colon, removed with a cold snare.                            Resected and retrieved.                           - Erythematous mucosa in the recto-sigmoid colon,                            in the sigmoid colon and in the descending colon.                            Biopsied to rule out chronic colitis.                           - Normal mucosa in the entire examined colon                            otherwise.                           - Non-bleeding non-thrombosed external and internal                            hemorrhoids. Recommendation:           - The patient will be observed post-procedure,                            until all discharge criteria are met.                           - Discharge patient to home.                           - Patient has a contact number available for                            emergencies. The signs and symptoms of potential  delayed complications were discussed with the                            patient. Return to normal activities tomorrow.                            Written discharge instructions were provided to the                             patient.                           - High fiber diet.                           - Use FiberCon 1-2 tablets PO daily.                           - Preparation H cream: Apply externally once to BID.                           - May consider Anusol suppositories in the future.                            Discussion depending on how things go moving                            forward as to whether he may benefit from attempt                            at internal hemorrhoidal banding by my partners vs                            Colorectal surgery evaluation for banding +                            hemorrhoidectomy can be considered in future.                           - Await pathology results.                           - Repeat colonoscopy in 3 years for surveillance                            based on pathology results.                           - The findings and recommendations were discussed                            with the patient.                           - The findings and recommendations were discussed  with the designated responsible adult. Justice Britain, MD 07/25/2020 11:29:24 AM

## 2020-07-25 NOTE — Progress Notes (Signed)
Called to room to assist during endoscopic procedure.  Patient ID and intended procedure confirmed with present staff. Received instructions for my participation in the procedure from the performing physician.  

## 2020-07-25 NOTE — Progress Notes (Signed)
pt tolerated well. VSS. awake and to recovery. Report given to RN.  

## 2020-07-25 NOTE — Progress Notes (Signed)
Patient consents to observer being present for procedure.   

## 2020-07-25 NOTE — Patient Instructions (Signed)
Handouts given:  Diverticulosis, Hemorrhoids, Polyps, High Fiber Diet Start using FiberCon 1-2 tabs daily Use Preparation H cream - applying once or twice daily May use Anusol suppositories Await pathology results Repeat colonoscopy in 3 years  YOU HAD AN ENDOSCOPIC PROCEDURE TODAY AT THE  ENDOSCOPY CENTER:   Refer to the procedure report that was given to you for any specific questions about what was found during the examination.  If the procedure report does not answer your questions, please call your gastroenterologist to clarify.  If you requested that your care partner not be given the details of your procedure findings, then the procedure report has been included in a sealed envelope for you to review at your convenience later.  YOU SHOULD EXPECT: Some feelings of bloating in the abdomen. Passage of more gas than usual.  Walking can help get rid of the air that was put into your GI tract during the procedure and reduce the bloating. If you had a lower endoscopy (such as a colonoscopy or flexible sigmoidoscopy) you may notice spotting of blood in your stool or on the toilet paper. If you underwent a bowel prep for your procedure, you may not have a normal bowel movement for a few days.  Please Note:  You might notice some irritation and congestion in your nose or some drainage.  This is from the oxygen used during your procedure.  There is no need for concern and it should clear up in a day or so.  SYMPTOMS TO REPORT IMMEDIATELY:   Following lower endoscopy (colonoscopy or flexible sigmoidoscopy):  Excessive amounts of blood in the stool  Significant tenderness or worsening of abdominal pains  Swelling of the abdomen that is new, acute  Fever of 100F or higher  For urgent or emergent issues, a gastroenterologist can be reached at any hour by calling [phone]. Do not use MyChart messaging for urgent concerns.   DIET:  We do recommend a small meal at first, but then you may  proceed to your regular diet.  Drink plenty of fluids but you should avoid alcoholic beverages for 24 hours.  ACTIVITY:  You should plan to take it easy for the rest of today and you should NOT DRIVE or use heavy machinery until tomorrow (because of the sedation medicines used during the test).    FOLLOW UP: Our staff will call the number listed on your records 48-72 hours following your procedure to check on you and address any questions or concerns that you may have regarding the information given to you following your procedure. If we do not reach you, we will leave a message.  We will attempt to reach you two times.  During this call, we will ask if you have developed any symptoms of COVID 19. If you develop any symptoms (ie: fever, flu-like symptoms, shortness of breath, cough etc.) before then, please call [phone].  If you test positive for Covid 19 in the 2 weeks post procedure, please call and report this information to [place].    If any biopsies were taken you will be contacted by phone or by letter within the next 1-3 weeks.  Please call us at [phone] if you have not heard about the biopsies in 3 weeks.   SIGNATURES/CONFIDENTIALITY: You and/or your care partner have signed paperwork which will be entered into your electronic medical record.  These signatures attest to the fact that that the information above on your After Visit Summary has been reviewed and is  understood.  Full responsibility of the confidentiality of this discharge information lies with you and/or your care-partner. 

## 2020-07-25 NOTE — Progress Notes (Signed)
Pt's states no medical or surgical changes since previsit or office visit.  FD vitals. 

## 2020-07-25 NOTE — Progress Notes (Signed)
Patient consents to observer being present for procedure.   Student:  Volanda Napoleon

## 2020-07-26 ENCOUNTER — Telehealth: Payer: Self-pay

## 2020-07-26 NOTE — Telephone Encounter (Signed)
Attempted to contact patient, left message regarding follow up of procedure. LN 

## 2020-07-26 NOTE — Telephone Encounter (Signed)
  Follow up Call-  Call back number 07/25/2020  Post procedure Call Back phone  # [phone]  Permission to leave phone message Yes  Some recent data might be hidden     Patient questions:  Do you have a fever, pain , or abdominal swelling? No. Pain Score  0 *  Have you tolerated food without any problems? Yes.    Have you been able to return to your normal activities? Yes.    Do you have any questions about your discharge instructions: Diet   No. Medications  No. Follow up visit  No.  Do you have questions or concerns about your Care? Yes.    Actions: * If pain score is 4 or above: No action needed, pain <4. 1. Have you developed a fever since your procedure? no  2.   Have you had an respiratory symptoms (SOB or cough) since your procedure? no  3.   Have you tested positive for COVID 19 since your procedure no  4.   Have you had any family members/close contacts diagnosed with the COVID 19 since your procedure?  No  Patient states that he left work early today because he felt a little nauseated and "drained".  He plans to take a COVID test today.  I explained to him that we need him to call us if he tests positive to let us know. Patient verbalizes understanding.     If yes to any of these questions please route to Laverna Peace, RN and Karlton Lemon, RN

## 2020-07-27 ENCOUNTER — Ambulatory Visit (HOSPITAL_COMMUNITY)
Admission: EM | Admit: 2020-07-27 | Discharge: 2020-07-27 | Disposition: A | Discharge status: Home / Self Care | Payer: PRIVATE HEALTH INSURANCE | Attending: Family Medicine | Admitting: Family Medicine

## 2020-07-27 ENCOUNTER — Other Ambulatory Visit: Payer: Self-pay

## 2020-07-27 DIAGNOSIS — Z1152 Encounter for screening for COVID-19: Secondary | ICD-10-CM

## 2020-07-27 DIAGNOSIS — Z20822 Contact with and (suspected) exposure to covid-19: Secondary | ICD-10-CM | POA: Diagnosis present

## 2020-07-27 LAB — SARS CORONAVIRUS 2 (TAT 6-24 HRS): SARS Coronavirus 2: NEGATIVE

## 2020-07-27 NOTE — ED Triage Notes (Signed)
Patient presents to clinic for COVID-19 testing.   Patient has no symptoms of COVID-19. Patient has no complaints.

## 2020-08-06 ENCOUNTER — Encounter: Payer: Self-pay | Admitting: Gastroenterology

## 2020-09-07 ENCOUNTER — Encounter (HOSPITAL_COMMUNITY): Payer: Self-pay | Admitting: Emergency Medicine

## 2020-09-07 ENCOUNTER — Ambulatory Visit (HOSPITAL_COMMUNITY)
Admission: EM | Admit: 2020-09-07 | Discharge: 2020-09-07 | Disposition: A | Discharge status: Home / Self Care | Payer: PRIVATE HEALTH INSURANCE | Attending: Urgent Care | Admitting: Urgent Care

## 2020-09-07 ENCOUNTER — Ambulatory Visit (INDEPENDENT_AMBULATORY_CARE_PROVIDER_SITE_OTHER): Payer: PRIVATE HEALTH INSURANCE

## 2020-09-07 ENCOUNTER — Other Ambulatory Visit: Payer: Self-pay

## 2020-09-07 DIAGNOSIS — S6991XA Unspecified injury of right wrist, hand and finger(s), initial encounter: Secondary | ICD-10-CM

## 2020-09-07 DIAGNOSIS — M79641 Pain in right hand: Secondary | ICD-10-CM

## 2020-09-07 DIAGNOSIS — T1490XA Injury, unspecified, initial encounter: Secondary | ICD-10-CM

## 2020-09-07 DIAGNOSIS — S60221A Contusion of right hand, initial encounter: Secondary | ICD-10-CM

## 2020-09-07 MED ORDER — NAPROXEN 500 MG PO TABS: 500.0000 mg | ORAL_TABLET | Freq: Two times a day (BID) | ORAL | 0 refills | Status: DC

## 2020-09-07 NOTE — Discharge Instructions (Signed)
You can do icing for only 20 minutes every 2 hours for your hand/knuckle. Make sure you have a barrier between the ice and your hand. Use naproxen twice daily for pain and inflammation.
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
## 2020-11-06 ENCOUNTER — Encounter (HOSPITAL_COMMUNITY): Payer: Self-pay

## 2020-11-06 ENCOUNTER — Other Ambulatory Visit: Payer: Self-pay

## 2020-11-06 ENCOUNTER — Ambulatory Visit (HOSPITAL_COMMUNITY)
Admission: EM | Admit: 2020-11-06 | Discharge: 2020-11-06 | Disposition: A | Discharge status: Home / Self Care | Payer: PRIVATE HEALTH INSURANCE | Attending: Urgent Care | Admitting: Urgent Care

## 2020-11-06 DIAGNOSIS — M25562 Pain in left knee: Secondary | ICD-10-CM | POA: Diagnosis not present

## 2020-11-06 DIAGNOSIS — M1712 Unilateral primary osteoarthritis, left knee: Secondary | ICD-10-CM

## 2020-11-06 DIAGNOSIS — M25561 Pain in right knee: Secondary | ICD-10-CM

## 2020-11-06 MED ORDER — METHYLPREDNISOLONE ACETATE 80 MG/ML IJ SUSP
INTRAMUSCULAR | Status: AC
  Filled 2020-11-06: qty 1

## 2020-11-06 MED ORDER — METHYLPREDNISOLONE ACETATE 80 MG/ML IJ SUSP
80.0000 mg | Freq: Once | INTRAMUSCULAR | Status: AC
  Administered 2020-11-06: 80 mg via INTRAMUSCULAR

## 2020-11-06 NOTE — ED Triage Notes (Addendum)
Pt c/o bilateral knee pain ongoing chronically, but worsening over the past couple days. States his knees are bone on bone and is going to have surgery on them. C/o burning in right knee, and tightness/swelling in left knee.   Pt reports taking tylenol/ibuprofen with no significant relief.

## 2020-11-06 NOTE — Discharge Instructions (Signed)
Please make sure you schedule of follow-up appointment with your orthopedist for further management of your knee arthritis.  In the meantime, you can use Tylenol for your pain control at a dose of 500mg -650mg  every 6 hours as needed.  [facility] for a recheck on your heart and clearance for your knee surgery.

## 2020-11-06 NOTE — ED Provider Notes (Signed)
Chris Fox   MRN: [id number] DOB: 1970/12/24  Subjective:   Chris Fox is a 50 y.o. male presenting for 2 day history of recurrent bilateral knee pain, left worse than right knee. Has had swelling in same knee. Sees an orthopedist and has been advised that he needs bilateral knee replacement. Cannot have his surgery until he is cleared by cardiology. Denies fall, trauma, redness, warmth. Has a history of gout as well.   No current facility-administered medications for this encounter.  Current Outpatient Medications:  .  naproxen (NAPROSYN) 500 MG tablet, Take 1 tablet (500 mg total) by mouth 2 (two) times daily with a meal., Disp: 30 tablet, Rfl: 0   Allergies  Allergen Reactions  . Colchicine Other (See Comments)    GI pain    Past Medical History:  Diagnosis Date  . Abdominal pain    a. LLQ 11/2011  . GERD (gastroesophageal reflux disease)   . Gout   . Headache(784.0)   . Midsternal chest pain    a. Cardiac cath 12/10/11 with normal coronaries and LV systolic function;  12/3147 Echo EF 55-60%, no valvular abnormalities or pericardial effusion  . Pericarditis    a. presumed - 11/2011  . ST elevation    a. Anterior ST elevation ? early repolarization vs pericarditis     Past Surgical History:  Procedure Laterality Date  . CARDIAC CATHETERIZATION  12/10/11   normal coronaries and LV systolic function;  Echo with normal LV function, EF 55-60%, no valvular abnormalities or pericardial effusion  . I & D EXTREMITY Right 08/27/2016   Procedure: IRRIGATION AND DEBRIDEMENT RIGHT FOOT;  Surgeon: Edrick Kins, DPM;  Location: [place];  Service: Podiatry;  Laterality: Right;  . LEFT HEART CATHETERIZATION WITH CORONARY ANGIOGRAM N/A 12/10/2011   Procedure: LEFT HEART CATHETERIZATION WITH CORONARY ANGIOGRAM;  Surgeon: Larey Dresser, MD;  Location: [facility] CATH LAB;  Service: Cardiovascular;  Laterality: N/A;    Family History  Problem Relation Age of Onset  .  Diabetes Maternal Grandmother   . Hypertension Maternal Grandmother   . Colon cancer Mother   . Hypertension Mother   . Colon cancer Maternal Grandfather   . Rectal cancer Neg Hx   . Stomach cancer Neg Hx   . Esophageal cancer Neg Hx     Social History   Tobacco Use  . Smoking status: Former Smoker    Packs/day: 1.50    Years: 21.00    Pack years: 31.50    Types: Cigarettes    Quit date: 09/11/2002    Years since quitting: 18.1  . Smokeless tobacco: Never Used  Vaping Use  . Vaping Use: Never used  Substance Use Topics  . Alcohol use: No  . Drug use: No    Frequency: 20.0 times per week    Types: Marijuana, Other-see comments    Comment: quit 1 month ago    ROS   Objective:   Vitals: BP 135/82   Pulse (!) 55 Comment: variable 50-68, Bernece Gall PA notified  Temp 98.6 F (37 C)   Resp 18   SpO2 100%   Physical Exam Constitutional:      General: He is not in acute distress.    Appearance: Normal appearance. He is well-developed and normal weight. He is not ill-appearing, toxic-appearing or diaphoretic.  HENT:     Head: Normocephalic and atraumatic.     Right Ear: External ear normal.     Left Ear: External ear normal.  Nose: Nose normal.     Mouth/Throat:     Pharynx: Oropharynx is clear.  Eyes:     General: No scleral icterus.       Right eye: No discharge.        Left eye: No discharge.     Extraocular Movements: Extraocular movements intact.     Pupils: Pupils are equal, round, and reactive to light.  Cardiovascular:     Rate and Rhythm: Normal rate.  Pulmonary:     Effort: Pulmonary effort is normal.  Musculoskeletal:     Cervical back: Normal range of motion.     Right knee: No swelling, deformity, erythema, ecchymosis, lacerations, bony tenderness or crepitus. Normal range of motion. Tenderness present over the medial joint line, lateral joint line and patellar tendon. Normal alignment and normal patellar mobility.     Left knee: Swelling and bony  tenderness present. No deformity, erythema, ecchymosis, lacerations or crepitus. Decreased range of motion. Tenderness present over the medial joint line and patellar tendon. Normal alignment and normal patellar mobility.  Skin:    General: Skin is warm and dry.  Neurological:     Mental Status: He is alert and oriented to person, place, and time.     Motor: No weakness.     Coordination: Coordination normal.     Gait: Gait normal.     Deep Tendon Reflexes: Reflexes normal.  Psychiatric:        Mood and Affect: Mood normal.        Behavior: Behavior normal.        Thought Content: Thought content normal.        Judgment: Judgment normal.     Assessment and Plan :   PDMP not reviewed this encounter.  1. Acute pain of left knee   2. Osteoarthritis of left knee, unspecified osteoarthritis type   3. Acute bilateral knee pain     IM depomedrol in clinic today for suspected arthritis flare in his knees as he has not had surgery for this. Needs close follow up with his orthopedist. Counseled patient on potential for adverse effects with medications prescribed/recommended today, ER and return-to-clinic precautions discussed, patient verbalized understanding.    Jaynee Eagles, PA-C 11/06/20 1242

## 2020-11-27 ENCOUNTER — Telehealth: Payer: Self-pay

## 2020-11-27 NOTE — Telephone Encounter (Signed)
   [facility] Pre-operative Risk Assessment    Patient Name: NATHANAL HERMIZ  DOB: 1970/08/10  MRN: [id number]   HEARTCARE STAFF: - Please ensure there is not already an duplicate clearance open for this procedure. - Under Visit Info/Reason for Call, type in Other and utilize the format Clearance MM/DD/YY or Clearance TBD. Do not use dashes or single digits. - If request is for dental extraction, please clarify the # of teeth to be extracted.  Request for surgical clearance:  1. What type of surgery is being performed? Left total Knee replacement   2. When is this surgery scheduled? TBD   3. What type of clearance is required (medical clearance vs. Pharmacy clearance to hold med vs. Both)? Medical   4. Are there any medications that need to be held prior to surgery and how long? None listed    5. Practice name and name of physician performing surgery? [facility], Dr. Audree Camel. [place]   6. What is the office phone number? [phone]    7.   What is the office fax number? [phone]  8.   Anesthesia type (None, local, MAC, general) ? Not listed    Jacqulynn Cadet 11/27/2020, 11:42 AM  _________________________________________________________________   (provider comments below)

## 2020-11-27 NOTE — Telephone Encounter (Signed)
Pt is scheduled to see Sande Rives, PA-C, 12/20/2020, and clearance will addressed at that visit.  Will route back to the requesting surgeon's office to make them aware.

## 2020-11-27 NOTE — Telephone Encounter (Signed)
1st attempt to reach pt regarding surgical clearance request.  Pt hasn't been seen in over 3 years and will need to make a new pt appointment in order to get cleared for surgery. Left pt a detailed message to call back to make an appointment.

## 2020-11-27 NOTE — Telephone Encounter (Signed)
   Name: Chris Fox  DOB: 1971/01/24  MRN: [id number]  Primary Cardiologist: Previously seen by Dr. Aundra Dubin in 2013, last seen by [name] NP on 10/29/2017. Technically considered a new patient at this point  Chart reviewed as part of pre-operative protocol coverage. Because of Chris Fox's past medical history and time since last visit, he will require a follow-up visit in order to better assess preoperative cardiovascular risk.  Pre-op covering staff: - Please schedule appointment and call patient to inform them. If patient already had an upcoming appointment within acceptable timeframe, please add "pre-op clearance" to the appointment notes so provider is aware. - Please contact requesting surgeon's office via preferred method (i.e, phone, fax) to inform them of need for appointment prior to surgery.  If applicable, this message will also be routed to pharmacy pool and/or primary cardiologist for input on holding anticoagulant/antiplatelet agent as requested below so that this information is available to the clearing provider at time of patient's appointment.   [place], [place]  11/27/2020, 11:51 AM

## 2020-12-16 NOTE — Progress Notes (Signed)
Cardiology Office Note:    Date:  12/20/2020   ID:  Chris Fox, DOB August 02, 1970, MRN [id number]  PCP:  Patient, No Pcp Per (Inactive)  Cardiologist:  Previously seen by Dr. Aundra Dubin in 2013. Now will follow with Dr. Audie Box. Electrophysiologist:  None   Referring MD: No ref. provider found   Chief Complaint: pre-op evaluation for upcoming knee surgery  History of Present Illness:    Chris Fox is a 50 y.o. male with a history of pericarditis in 2013, hypertension, and GERD who presents today for pre-op evaluation for upcoming left total knee replacement.   Patient was admitted in 2013 with chest pain. He was noted to have anterior ST segment elevation which was felt to be most consistent with LVH and early repolarization. Echo showed normal LV function without evidence of pericardial effusion. Patient underwent cardiac catheterization which showed normal coronaries. It was felt that his symptoms were likely related to pericarditis and he was placed on NSAID therapy. He did not tolerate Colchicine. He was last seen by [name], NP, in 2019 for pre-op evaluation after not having been seen since 2013. At that visit, he reported recurrent chest pain with and without exertion that he described as a sharp pain radiating to his neck that takes his breath away. Myoview and Echo were recommended for further risk stratification. Of note, patient became upset and did not want to come back for another set of tests that would require additional copays. It was arranged for him to have [place] that same day but patient was unwilling to come back for Echo so this was not ordered. Myoview was low risk with no evidence of ischemia. Therefore, it was felt like he was at acceptable risk for surgery. Patient has not been since that time.  Patient presents today for pre-op evaluation for upcoming left total knee replacement. He denies any chest pain but does report shortness of breath with  activities such as walking his dog a short ways as well as decreased exercise tolerance. He as significant shortness of breath and fatigue when walking up a flight of steps. He states he almost has to pull himself up each step. He states this is new but does think it may be due to his significant bilateral knee pain. No orthopnea or PND. He has some swelling of his knees but no other edema No palpitations, lightheadedness/dizziness, or near syncope/syncope.   Past Medical History:  Diagnosis Date  . Abdominal pain    a. LLQ 11/2011  . GERD (gastroesophageal reflux disease)   . Gout   . Headache(784.0)   . Midsternal chest pain    a. Cardiac cath 12/10/11 with normal coronaries and LV systolic function;  09/5571 Echo EF 55-60%, no valvular abnormalities or pericardial effusion  . Pericarditis    a. presumed - 11/2011  . ST elevation    a. Anterior ST elevation ? early repolarization vs pericarditis    Past Surgical History:  Procedure Laterality Date  . CARDIAC CATHETERIZATION  12/10/11   normal coronaries and LV systolic function;  Echo with normal LV function, EF 55-60%, no valvular abnormalities or pericardial effusion  . I & D EXTREMITY Right 08/27/2016   Procedure: IRRIGATION AND DEBRIDEMENT RIGHT FOOT;  Surgeon: Edrick Kins, DPM;  Location: [facility];  Service: Podiatry;  Laterality: Right;  . LEFT HEART CATHETERIZATION WITH CORONARY ANGIOGRAM N/A 12/10/2011   Procedure: LEFT HEART CATHETERIZATION WITH CORONARY ANGIOGRAM;  Surgeon: Larey Dresser, MD;  Location:  [facility] CATH [facility];  Service: Cardiovascular;  Laterality: N/A;    Current Medications: Current Meds  Medication Sig  . acetaminophen (TYLENOL) 500 MG tablet Take 500 mg by mouth. As Needed  . ibuprofen (ADVIL) 200 MG tablet Take 200 mg by mouth. As Needed     Allergies:   Colchicine   Social History   Socioeconomic History  . Marital status: Single    Spouse name: Not on file  . Number of children: Not on file  . Years of  education: Not on file  . Highest education level: Not on file  Occupational History  . Not on file  Tobacco Use  . Smoking status: Former Smoker    Packs/day: 1.50    Years: 21.00    Pack years: 31.50    Types: Cigarettes    Quit date: 09/11/2002    Years since quitting: 18.2  . Smokeless tobacco: Never Used  Vaping Use  . Vaping Use: Never used  Substance and Sexual Activity  . Alcohol use: No  . Drug use: No    Frequency: 20.0 times per week    Types: Marijuana, Other-see comments    Comment: quit 1 month ago  . Sexual activity: Never  Other Topics Concern  . Not on file  Social History Narrative  . Not on file   Social Determinants of Health   Financial Resource Strain: Not on file  Food Insecurity: Not on file  Transportation Needs: Not on file  Physical Activity: Not on file  Stress: Not on file  Social Connections: Not on file     Family History: The patient's family history includes Colon cancer in his maternal grandfather and mother; Diabetes in his maternal grandmother; Hypertension in his maternal grandmother and mother. There is no history of Rectal cancer, Stomach cancer, or Esophageal cancer.  ROS:   Please see the history of present illness.     EKGs/Labs/Other Studies Reviewed:    The following studies were reviewed today:  Left Cardiac Catheterization 12/09/2020: Coronary angiography: - Coronary dominance: right - Left mainstem: Short, no angiographic coronary disease.  - Left anterior descending (LAD): No angiographic coronary disease.  - Left circumflex (LCx): No angiographic coronary disease.  - Right coronary artery (RCA): Relatively small vessel, no angiographic coronary disease.   Left ventriculography: Left ventricular systolic function is normal, LVEF is estimated at 55%, there is no significant mitral regurgitation   Final Conclusions:  Normal coronaries.  Normal LV systolic function.  Possible early repolarization versus pericarditis  pattern on ECG (chest pain is pleuritic).  Will plan to treat with Ibuprofen and PPI for now.  Awaiting labs including BMET, CBC, cardiac enzymes.  _______________  Echocardiogram 12/10/2011: Study Conclusions: Left ventricle: The cavity size was normal. Wall thickness  was normal. Systolic function was normal. The estimated  ejection fraction was in the range of 55% to 60%.  _______________  Myoview 10/29/2017:  Nuclear stress EF: 53%.  There was no ST segment deviation noted during stress.  Defect 1: There is a small defect of mild severity present in the apical lateral location.  This is a low risk study. No ischemia identified.   EKG:  EKG  ordered today. EKG personally reviewed and demonstrates sinus bradycardia, rate 51 bpm, with LVH and diffuse concave ST elevation in lead II and anterolateral leads consistent with early repolarization. No significant changes from prior tracings.  Recent Labs: 02/21/2020: BUN 8; Creatinine, Ser 0.77; Potassium 3.4; Sodium 139 06/20/2020: Hemoglobin 12.8; Platelets 185.0  Recent Lipid Panel    Component Value Date/Time   CHOL 151 12/10/2011 1459   TRIG 106 12/10/2011 1459   HDL 35 (L) 12/10/2011 1459   CHOLHDL 4.3 12/10/2011 1459   VLDL 21 12/10/2011 1459   LDLCALC 95 12/10/2011 1459    Physical Exam:    Vital Signs: BP 110/70   Pulse (!) 51   Ht 5\' 7"  (1.702 m)   Wt 166 lb 9.6 oz (75.6 kg)   BMI 26.09 kg/m     Wt Readings from Last 3 Encounters:  12/20/20 166 lb 9.6 oz (75.6 kg)  07/27/20 160 lb (72.6 kg)  07/25/20 161 lb (73 kg)     General: 50 y.o. African-American male in no acute distress. HEENT: Normocephalic and atraumatic. Sclera clear.  Neck: Supple. No carotid bruits. No JVD. Heart: Bradycardic with normal rhythm. Distinct S1 and S2. No murmurs, gallops, or rubs.  Lungs: No increased work of breathing. Clear to ausculation bilaterally. No wheezes, rhonchi, or rales.  Abdomen: Soft, non-distended, and non-tender to  palpation.  Extremities: No lower extremity edema.    Skin: Warm and dry. Neuro: Alert and oriented x3. No focal deficits. Psych: Normal affect. Responds appropriately.  Assessment:    1. Pre-op evaluation   2. History of pericarditis   3. Primary hypertension   4. Dyspnea on exertion   5. Decreased exercise tolerance   6. Sinus bradycardia     Plan:    Pre-Op Evaluation - Patient has upcoming total knee replacement planned.  - Patient reports dyspnea on exertion and decreased exercise tolerance/fatigue which is new.  - EKG shows diffuse concave ST elevation consistent with early repolarization. No significant changes compared to prior tracings.  - Will order Lexiscan Myoview and Echo for further evaluation prior to surgery. If these are unremarkable, he will be at acceptable risk for surgery.   Dyspnea on Exertion Decreased Exercise Tolerance - Patient reports dyspnea on exertion and decreased exercise tolerance.  - He does not look volume overloaded on exam. No other signs/symptoms of CHF. - Will order Lexiscan Myoview and Echo for further evaluation.  Shared Decision Making/Informed Consent{ The risks [chest pain, shortness of breath, cardiac arrhythmias, dizziness, blood pressure fluctuations, myocardial infarction, stroke/transient ischemic attack, nausea, vomiting, allergic reaction, radiation exposure, metallic taste sensation and life-threatening complications (estimated to be 1 in 10,000)], benefits (risk stratification, diagnosing coronary artery disease, treatment guidance) and alternatives of a nuclear stress test were discussed in detail with Mr. Kralicek and he agrees to proceed.  History of Pericarditis  - History of pericarditis in 2013. LHC at the time showed normal coronaries.  - No chest pain.  Hypertension - History of hypertension but not on any medications at home. - BP well controlled. - No changes at this time.  Sinus Bradycardia - Chronic.  Asymptomatic. - Patient is not on any AV nodal agents.  - Will check BMET, Magnesium, and TSH when patient comes for above studies next month.   Disposition: Follow up in 1 year with Dr. Audie Box. Patient has not been seen by a MD since 2013 when he saw Dr. Aundra Dubin. He has agreed to have Dr. Audie Box as his primary Cardiologist.   Medication Adjustments/Labs and Tests Ordered: Current medicines are reviewed at length with the patient today.  Concerns regarding medicines are outlined above.  Orders Placed This Encounter  Procedures  . Cardiac Stress Test: Informed Consent Details: Physician/Practitioner Attestation; Transcribe to consent form and obtain patient signature  . MYOCARDIAL PERFUSION IMAGING  .  EKG 12-Lead  . ECHOCARDIOGRAM COMPLETE   No orders of the defined types were placed in this encounter.   Patient Instructions  Medication Instructions:  Your physician recommends that you continue on your current medications as directed. Please refer to the Current Medication list given to you today.  *If you need a refill on your cardiac medications before your next appointment, please call your pharmacy*  Lab Work: NONE ordered at this time of appointment   If you have labs (blood work) drawn today and your tests are completely normal, you will receive your results only by: Marland Kitchen MyChart Message (if you have MyChart) OR . A paper copy in the mail If you have any lab test that is abnormal or we need to change your treatment, we will call you to review the results.  Testing/Procedures: Your physician has requested that you have an echocardiogram. Echocardiography is a painless test that uses sound waves to create images of your heart. It provides your doctor with information about the size and shape of your heart and how well your heart's chambers and valves are working. This procedure takes approximately one hour. There are no restrictions for this procedure.   Please schedule for 3-4  week  Your physician has requested that you have a lexiscan myoview. For further information please visit [email or web address]. Please follow instruction sheet, as given.   Please schedule for 3-4 weeks   Follow-Up: At [facility], you and your health needs are our priority.  As part of our continuing mission to provide you with exceptional heart care, we have created designated Provider Care Teams.  These Care Teams include your primary Cardiologist (physician) and Advanced Practice Providers (APPs -  Physician Assistants and Nurse Practitioners) who all work together to provide you with the care you need, when you need it.  We recommend signing up for the patient portal called "MyChart".  Sign up information is provided on this After Visit Summary.  MyChart is used to connect with patients for Virtual Visits (Telemedicine).  Patients are able to view lab/test results, encounter notes, upcoming appointments, etc.  Non-urgent messages can be sent to your provider as well.   To learn more about what you can do with MyChart, go to [email or web address].    Your next appointment:   12 month(s)  The format for your next appointment:   In Person  Provider:   Eleonore Chiquito, MD  Other Instructions      Signed, Darreld Mclean, PA-C  12/20/2020 3:16 PM    [place]

## 2020-12-20 ENCOUNTER — Encounter: Payer: Self-pay | Admitting: Student

## 2020-12-20 ENCOUNTER — Ambulatory Visit: Payer: PRIVATE HEALTH INSURANCE | Admitting: Student

## 2020-12-20 ENCOUNTER — Other Ambulatory Visit: Payer: Self-pay

## 2020-12-20 VITALS — BP 110/70 | HR 51 | Ht 67.0 in | Wt 166.6 lb

## 2020-12-20 DIAGNOSIS — R0600 Dyspnea, unspecified: Secondary | ICD-10-CM

## 2020-12-20 DIAGNOSIS — Z01818 Encounter for other preprocedural examination: Secondary | ICD-10-CM

## 2020-12-20 DIAGNOSIS — R6889 Other general symptoms and signs: Secondary | ICD-10-CM | POA: Diagnosis not present

## 2020-12-20 DIAGNOSIS — R001 Bradycardia, unspecified: Secondary | ICD-10-CM

## 2020-12-20 DIAGNOSIS — R0609 Other forms of dyspnea: Secondary | ICD-10-CM

## 2020-12-20 DIAGNOSIS — I10 Essential (primary) hypertension: Secondary | ICD-10-CM

## 2020-12-20 DIAGNOSIS — Z8679 Personal history of other diseases of the circulatory system: Secondary | ICD-10-CM

## 2020-12-20 NOTE — Patient Instructions (Signed)
Medication Instructions:  Your physician recommends that you continue on your current medications as directed. Please refer to the Current Medication list given to you today.  *If you need a refill on your cardiac medications before your next appointment, please call your pharmacy*  Lab Work: NONE ordered at this time of appointment   If you have labs (blood work) drawn today and your tests are completely normal, you will receive your results only by: Marland Kitchen MyChart Message (if you have MyChart) OR . A paper copy in the mail If you have any lab test that is abnormal or we need to change your treatment, we will call you to review the results.  Testing/Procedures: Your physician has requested that you have an echocardiogram. Echocardiography is a painless test that uses sound waves to create images of your heart. It provides your doctor with information about the size and shape of your heart and how well your heart's chambers and valves are working. This procedure takes approximately one hour. There are no restrictions for this procedure.   Please schedule for 3-4 week  Your physician has requested that you have a lexiscan myoview. For further information please visit [email or web address]. Please follow instruction sheet, as given.   Please schedule for 3-4 weeks   Follow-Up: At [facility], you and your health needs are our priority.  As part of our continuing mission to provide you with exceptional heart care, we have created designated Provider Care Teams.  These Care Teams include your primary Cardiologist (physician) and Advanced Practice Providers (APPs -  Physician Assistants and Nurse Practitioners) who all work together to provide you with the care you need, when you need it.  We recommend signing up for the patient portal called "MyChart".  Sign up information is provided on this After Visit Summary.  MyChart is used to connect with patients for Virtual Visits (Telemedicine).   Patients are able to view lab/test results, encounter notes, upcoming appointments, etc.  Non-urgent messages can be sent to your provider as well.   To learn more about what you can do with MyChart, go to [email or web address].    Your next appointment:   12 month(s)  The format for your next appointment:   In Person  Provider:   Eleonore Chiquito, MD  Other Instructions

## 2021-01-16 ENCOUNTER — Telehealth (HOSPITAL_COMMUNITY): Payer: Self-pay | Admitting: *Deleted

## 2021-01-16 NOTE — Telephone Encounter (Signed)
Left message on voicemail per DPR in reference to upcoming appointment scheduled on 01/22/21 at 10:45 with detailed instructions given per Myocardial Perfusion Study Information Sheet for the test. LM to arrive 15 minutes early, and that it is imperative to arrive on time for appointment to keep from having the test rescheduled. If you need to cancel or reschedule your appointment, please call the office within 24 hours of your appointment. Failure to do so may result in a cancellation of your appointment, and a $50 no show fee. Phone number given for call back for any questions.

## 2021-01-22 ENCOUNTER — Other Ambulatory Visit: Payer: Self-pay

## 2021-01-22 ENCOUNTER — Ambulatory Visit (HOSPITAL_BASED_OUTPATIENT_CLINIC_OR_DEPARTMENT_OTHER): Payer: PRIVATE HEALTH INSURANCE

## 2021-01-22 ENCOUNTER — Ambulatory Visit (HOSPITAL_COMMUNITY): Payer: PRIVATE HEALTH INSURANCE | Attending: Cardiovascular Disease

## 2021-01-22 DIAGNOSIS — R0600 Dyspnea, unspecified: Secondary | ICD-10-CM | POA: Insufficient documentation

## 2021-01-22 DIAGNOSIS — R0609 Other forms of dyspnea: Secondary | ICD-10-CM

## 2021-01-22 DIAGNOSIS — R6889 Other general symptoms and signs: Secondary | ICD-10-CM

## 2021-01-22 LAB — MYOCARDIAL PERFUSION IMAGING
LV dias vol: 117 mL (ref 62–150)
LV sys vol: 55 mL
Peak HR: 93 {beats}/min
Rest HR: 42 {beats}/min
SDS: 0
SRS: 0
SSS: 0
TID: 1.09

## 2021-01-22 LAB — ECHOCARDIOGRAM COMPLETE
Area-P 1/2: 3.6 cm2
S' Lateral: 3.1 cm

## 2021-01-22 MED ORDER — TECHNETIUM TC 99M TETROFOSMIN IV KIT
8.9000 | PACK | Freq: Once | INTRAVENOUS | Status: AC | PRN
  Administered 2021-01-22: 8.9 via INTRAVENOUS
  Filled 2021-01-22: qty 9

## 2021-01-22 MED ORDER — TECHNETIUM TC 99M TETROFOSMIN IV KIT
29.3000 | PACK | Freq: Once | INTRAVENOUS | Status: AC | PRN
  Administered 2021-01-22: 29.3 via INTRAVENOUS
  Filled 2021-01-22: qty 30

## 2021-01-22 MED ORDER — REGADENOSON 0.4 MG/5ML IV SOLN
0.4000 mg | Freq: Once | INTRAVENOUS | Status: AC
  Administered 2021-01-22: 0.4 mg via INTRAVENOUS

## 2021-01-22 MED ORDER — PERFLUTREN LIPID MICROSPHERE
1.0000 mL | INTRAVENOUS | Status: AC | PRN
  Administered 2021-01-22: 1 mL via INTRAVENOUS

## 2021-01-22 NOTE — Telephone Encounter (Signed)
    Chris Fox DOB:  May 07, 1971  MRN:  [id number]   Primary Cardiologist: Evalina Field, MD  Chart reviewed as part of pre-operative protocol coverage.  He underwent a NST which was without ischemia and an echocardiogram which showed a normal heart pumping function and no valvular abnormalities.   Based on ACC/AHA guidelines, Chris Fox would be at acceptable risk for the planned procedure without further cardiovascular testing.   Abigail Butts, PA-C 01/22/2021, 3:41 PM

## 2021-02-26 ENCOUNTER — Ambulatory Visit (HOSPITAL_COMMUNITY)
Admission: EM | Admit: 2021-02-26 | Discharge: 2021-02-26 | Disposition: A | Discharge status: Home / Self Care | Payer: No Typology Code available for payment source | Attending: Student | Admitting: Student

## 2021-02-26 ENCOUNTER — Encounter (HOSPITAL_COMMUNITY): Payer: Self-pay

## 2021-02-26 ENCOUNTER — Other Ambulatory Visit: Payer: Self-pay

## 2021-02-26 DIAGNOSIS — M170 Bilateral primary osteoarthritis of knee: Secondary | ICD-10-CM

## 2021-02-26 MED ORDER — KETOROLAC TROMETHAMINE 10 MG PO TABS: 10.0000 mg | ORAL_TABLET | Freq: Four times a day (QID) | ORAL | 0 refills | Status: DC | PRN

## 2021-02-26 NOTE — ED Triage Notes (Signed)
Pt presents with chronic bilateral knee pain  for about 2 years that is progressing and causing falls.

## 2021-02-26 NOTE — ED Provider Notes (Signed)
[facility]    CSN: [id number] Arrival date & time: 02/26/21  1031      History   Chief Complaint Chief Complaint  Patient presents with   Knee Pain    HPI Chris Fox is a 50 y.o. male presenting with bilateral knee osteoarthritis, current exacerbation.  Patient has been evaluated for this by urgent care multiple times, and is followed by Ortho; states he needs a bilateral knee replacement, he is in the process of scheduling this.  He has a job that involves a lot of standing and kneeling, so he thinks this exacerbates the pain.  Denies any new trauma.  Tylenol and ibuprofen providing minimal relief.  Has tried knee brace in the past, this provides no relief.  HPI  Past Medical History:  Diagnosis Date   Abdominal pain    a. LLQ 11/2011   GERD (gastroesophageal reflux disease)    Gout    Headache(784.0)    Midsternal chest pain    a. Cardiac cath 12/10/11 with normal coronaries and LV systolic function;  [id number] Echo EF 55-60%, no valvular abnormalities or pericardial effusion   Pericarditis    a. presumed - 11/2011   ST elevation    a. Anterior ST elevation ? early repolarization vs pericarditis    Patient Active Problem List   Diagnosis Date Noted   Macrocytic anemia 06/20/2020   Family history of colon cancer 06/20/2020   Rectal bleeding 06/20/2020   Foot abscess 08/25/2016   Gastroesophageal reflux disease 08/25/2016   Gout 08/25/2016   Midsternal chest pain    ST elevation    Abdominal pain    Chest pain 12/10/2011   Bradycardia 12/10/2011    Past Surgical History:  Procedure Laterality Date   CARDIAC CATHETERIZATION  12/10/11   normal coronaries and LV systolic function;  Echo with normal LV function, EF 55-60%, no valvular abnormalities or pericardial effusion   I & D EXTREMITY Right 08/27/2016   Procedure: IRRIGATION AND DEBRIDEMENT RIGHT FOOT;  Surgeon: Edrick Kins, DPM;  Location: [facility];  Service: Podiatry;  Laterality: Right;   LEFT  HEART CATHETERIZATION WITH CORONARY ANGIOGRAM N/A 12/10/2011   Procedure: LEFT HEART CATHETERIZATION WITH CORONARY ANGIOGRAM;  Surgeon: Larey Dresser, MD;  Location: [facility] CATH LAB;  Service: Cardiovascular;  Laterality: N/A;       Home Medications    Prior to Admission medications   Medication Sig Start Date End Date Taking? Authorizing Provider  ketorolac (TORADOL) 10 MG tablet Take 1 tablet (10 mg total) by mouth every 6 (six) hours as needed. 02/26/21  Yes Hazel Sams, PA-C  acetaminophen (TYLENOL) 500 MG tablet Take 500 mg by mouth. As Needed 04/18/20   [provider]    Family History Family History  Problem Relation Age of Onset   Diabetes Maternal Grandmother    Hypertension Maternal Grandmother    Colon cancer Mother    Hypertension Mother    Colon cancer Maternal Grandfather    Rectal cancer Neg Hx    Stomach cancer Neg Hx    Esophageal cancer Neg Hx     Social History Social History   Tobacco Use   Smoking status: Former    Packs/day: 1.50    Years: 21.00    Pack years: 31.50    Types: Cigarettes    Quit date: 09/11/2002    Years since quitting: 18.4   Smokeless tobacco: Never  Vaping Use   Vaping Use: Never used  Substance Use  Topics   Alcohol use: No   Drug use: No    Frequency: 20.0 times per week    Types: Marijuana, Other-see comments    Comment: quit 1 month ago     Allergies   Colchicine   Review of Systems Review of Systems  Musculoskeletal:        Bilateral knee pain  All other systems reviewed and are negative.   Physical Exam Triage Vital Signs ED Triage Vitals  Enc Vitals Group     BP 02/26/21 1158 123/84     Pulse Rate 02/26/21 1158 60     Resp 02/26/21 1158 17     Temp 02/26/21 1158 98.4 F (36.9 C)     Temp Source 02/26/21 1158 Oral     SpO2 02/26/21 1158 96 %     Weight --      Height --      Head Circumference --      Peak Flow --      Pain Score 02/26/21 1157 9     Pain Loc --      Pain Edu? --       Excl. in [place]? --    No data found.  Updated Vital Signs BP 123/84 (BP Location: Left Arm)   Pulse 60   Temp 98.4 F (36.9 C) (Oral)   Resp 17   SpO2 96%   Visual Acuity Right Eye Distance:   Left Eye Distance:   Bilateral Distance:    Right Eye Near:   Left Eye Near:    Bilateral Near:     Physical Exam Vitals reviewed.  Constitutional:      General: He is not in acute distress.    Appearance: Normal appearance. He is not ill-appearing or diaphoretic.  HENT:     Head: Normocephalic and atraumatic.  Cardiovascular:     Rate and Rhythm: Normal rate and regular rhythm.     Heart sounds: Normal heart sounds.  Pulmonary:     Effort: Pulmonary effort is normal.     Breath sounds: Normal breath sounds.  Musculoskeletal:     Comments: Bilateral knees with crepitus on flexion and extension. L knee effusion proximal to patella. No joint laxity. Negative valgus and varus stress tests. Negative mcmurray.  Skin:    General: Skin is warm.  Neurological:     General: No focal deficit present.     Mental Status: He is alert and oriented to person, place, and time.  Psychiatric:        Mood and Affect: Mood normal.        Behavior: Behavior normal.        Thought Content: Thought content normal.        Judgment: Judgment normal.     UC Treatments / Results  Labs (all labs ordered are listed, but only abnormal results are displayed) Labs Reviewed - No data to display  EKG   Radiology No results found.  Procedures Procedures (including critical care time)  Medications Ordered in UC Medications - No data to display  Initial Impression / Assessment and Plan / UC Course  I have reviewed the triage vital signs and the nursing notes.  Pertinent labs & imaging results that were available during my care of the patient were reviewed by me and considered in my medical decision making (see chart for details).     This patient is a very pleasant 50 y.o. year old male  presenting with bilateral knee OA. Trial of  toradol. He is in the process of scheduling knee replacement- f/u with ortho. ED return precautions discussed. Patient verbalizes understanding and agreement.     Final Clinical Impressions(s) / UC Diagnoses   Final diagnoses:  Primary osteoarthritis of both knees     Discharge Instructions      -Toradol (Ketorolac), one pill up to every 6 hours for pain. Make sure to take this with food. Avoid other NSAIDs like ibuprofen, alleve, naproxen while taking this medication.  -You can continue Tylenol '1000mg'$  up to 3x daily -Follow-up with orthopedist if symptoms persist   ED Prescriptions     Medication Sig Dispense Auth. Provider   ketorolac (TORADOL) 10 MG tablet Take 1 tablet (10 mg total) by mouth every 6 (six) hours as needed. 25 tablet Hazel Sams, PA-C      PDMP not reviewed this encounter.   Hazel Sams, PA-C 02/26/21 1227

## 2021-02-26 NOTE — Discharge Instructions (Addendum)
-  Toradol (Ketorolac), one pill up to every 6 hours for pain. Make sure to take this with food. Avoid other NSAIDs like ibuprofen, alleve, naproxen while taking this medication.  -You can continue Tylenol '1000mg'$  up to 3x daily -Follow-up with orthopedist if symptoms persist

## 2021-03-06 NOTE — Telephone Encounter (Signed)
Patient was calling in regarding this clearance. He stated that he hasnt heard anything for him to have surgery. Patients states his knees are getting worse and he needs this done as soon as possible. Please advise

## 2021-03-06 NOTE — Telephone Encounter (Signed)
It appears [name] has previously clear the patient on 01/22/2021 after his stress test came back.  [name] has already forwarded a cardiac clearance letter to the surgeon's office.  I attempted to call the patient to let him know as well, however as soon as I requested Mr. Chris Fox's attention, the phone call was hanged up.  I ended up calling him back and left a message informing him that I will send over another cardiac clearance letter to the surgeon's office and he should contact Dr. Laureen Abrahams office to see when the surgery can be set up.

## 2021-05-14 ENCOUNTER — Other Ambulatory Visit: Payer: Self-pay

## 2021-05-14 ENCOUNTER — Ambulatory Visit (HOSPITAL_COMMUNITY)
Admission: EM | Admit: 2021-05-14 | Discharge: 2021-05-14 | Disposition: A | Discharge status: Home / Self Care | Payer: No Typology Code available for payment source | Attending: Sports Medicine | Admitting: Sports Medicine

## 2021-05-14 ENCOUNTER — Encounter (HOSPITAL_COMMUNITY): Payer: Self-pay

## 2021-05-14 DIAGNOSIS — G8929 Other chronic pain: Secondary | ICD-10-CM | POA: Diagnosis not present

## 2021-05-14 DIAGNOSIS — M1711 Unilateral primary osteoarthritis, right knee: Secondary | ICD-10-CM | POA: Diagnosis not present

## 2021-05-14 DIAGNOSIS — M25562 Pain in left knee: Secondary | ICD-10-CM | POA: Diagnosis not present

## 2021-05-14 DIAGNOSIS — M25462 Effusion, left knee: Secondary | ICD-10-CM | POA: Diagnosis not present

## 2021-05-14 DIAGNOSIS — M25561 Pain in right knee: Secondary | ICD-10-CM | POA: Diagnosis not present

## 2021-05-14 DIAGNOSIS — M1712 Unilateral primary osteoarthritis, left knee: Secondary | ICD-10-CM

## 2021-05-14 DIAGNOSIS — M170 Bilateral primary osteoarthritis of knee: Secondary | ICD-10-CM

## 2021-05-14 MED ORDER — METHYLPREDNISOLONE ACETATE 40 MG/ML IJ SUSP
INTRAMUSCULAR | Status: AC
  Filled 2021-05-14: qty 1

## 2021-05-14 MED ORDER — METHYLPREDNISOLONE ACETATE 40 MG/ML IJ SUSP
40.0000 mg | Freq: Once | INTRAMUSCULAR | Status: AC
  Administered 2021-05-14: 40 mg via INTRA_ARTICULAR

## 2021-05-14 NOTE — ED Provider Notes (Signed)
[facility]    CSN: [id number] Arrival date & time: 05/14/21  1547      History   Chief Complaint Chief Complaint  Patient presents with   Knee Pain    HPI Chris Fox is a 50 y.o. male who presents with acute on chronic b/l knee pain.  Patient has a history of chronic knee pain bilaterally, although left > right. He has followed with orthopedics in the past, and tells me today that they are in the process of evaluating for b/l knee replacements. He has tried injections, toradol, NSAIDs with only temporary relief. His left knee is worse than right and will swell on him. He does get crepitus in both knees and the pain will cause his legs to "give out" and he has had a few near falls. Pain is worse with activity, bending the knees and prolonged standing. He has not taken any Ibuprofen or tylenol for the knees in a few days. He denies any redness, warmth to the knees. Denies any fever/chills, or signs of systemic illness.   X-rays from 07/2018 demonstrate moderate tricompartmental OA of both knees.   Past Medical History:  Diagnosis Date   Abdominal pain    a. LLQ 11/2011   GERD (gastroesophageal reflux disease)    Gout    Headache(784.0)    Midsternal chest pain    a. Cardiac cath 12/10/11 with normal coronaries and LV systolic function;  11/930 Echo EF 55-60%, no valvular abnormalities or pericardial effusion   Pericarditis    a. presumed - 11/2011   ST elevation    a. Anterior ST elevation ? early repolarization vs pericarditis    Patient Active Problem List   Diagnosis Date Noted   Macrocytic anemia 06/20/2020   Family history of colon cancer 06/20/2020   Rectal bleeding 06/20/2020   Foot abscess 08/25/2016   Gastroesophageal reflux disease 08/25/2016   Gout 08/25/2016   Midsternal chest pain    ST elevation    Abdominal pain    Chest pain 12/10/2011   Bradycardia 12/10/2011    Past Surgical History:  Procedure Laterality Date   CARDIAC  CATHETERIZATION  12/10/11   normal coronaries and LV systolic function;  Echo with normal LV function, EF 55-60%, no valvular abnormalities or pericardial effusion   I & D EXTREMITY Right 08/27/2016   Procedure: IRRIGATION AND DEBRIDEMENT RIGHT FOOT;  Surgeon: Edrick Kins, DPM;  Location: [facility];  Service: Podiatry;  Laterality: Right;   LEFT HEART CATHETERIZATION WITH CORONARY ANGIOGRAM N/A 12/10/2011   Procedure: LEFT HEART CATHETERIZATION WITH CORONARY ANGIOGRAM;  Surgeon: Larey Dresser, MD;  Location: [facility] CATH LAB;  Service: Cardiovascular;  Laterality: N/A;       Home Medications    Prior to Admission medications   Medication Sig Start Date End Date Taking? Authorizing Provider  acetaminophen (TYLENOL) 500 MG tablet Take 500 mg by mouth. As Needed 04/18/20   [provider]  ketorolac (TORADOL) 10 MG tablet Take 1 tablet (10 mg total) by mouth every 6 (six) hours as needed. 02/26/21   Hazel Sams, PA-C    Family History Family History  Problem Relation Age of Onset   Diabetes Maternal Grandmother    Hypertension Maternal Grandmother    Colon cancer Mother    Hypertension Mother    Colon cancer Maternal Grandfather    Rectal cancer Neg Hx    Stomach cancer Neg Hx    Esophageal cancer Neg Hx  Social History Social History   Tobacco Use   Smoking status: Former    Packs/day: 1.50    Years: 21.00    Pack years: 31.50    Types: Cigarettes    Quit date: 09/11/2002    Years since quitting: 18.6   Smokeless tobacco: Never  Vaping Use   Vaping Use: Never used  Substance Use Topics   Alcohol use: No   Drug use: No    Frequency: 20.0 times per week    Types: Marijuana, Other-see comments    Comment: quit 1 month ago     Allergies   Colchicine   Review of Systems Review of Systems  Constitutional:  Negative for chills, fatigue and fever.  Musculoskeletal:  Positive for arthralgias (b/l knee pain, L > R) and gait problem.       + left knee swelling;  denies redness or increased warmth    Physical Exam Triage Vital Signs ED Triage Vitals  Enc Vitals Group     BP 05/14/21 1718 110/74     Pulse Rate 05/14/21 1718 60     Resp 05/14/21 1718 19     Temp 05/14/21 1718 98.2 F (36.8 C)     Temp Source 05/14/21 1718 Oral     SpO2 05/14/21 1718 97 %     Weight --      Height --      Head Circumference --      Peak Flow --      Pain Score 05/14/21 1716 10     Pain Loc --      Pain Edu? --      Excl. in [place]? --    No data found.  Updated Vital Signs BP 110/74 (BP Location: Right Arm)   Pulse 60   Temp 98.2 F (36.8 C) (Oral)   Resp 19   SpO2 97%   Visual Acuity Right Eye Distance:   Left Eye Distance:   Bilateral Distance:    Right Eye Near:   Left Eye Near:    Bilateral Near:     Physical Exam Gen: Well-appearing, in no acute distress; non-toxic CV: Regular Rate. Well-perfused. Warm.  Resp: Breathing unlabored on room air; no wheezing. Psych: Fluid speech in conversation; appropriate affect; normal thought process Neuro: Sensation intact throughout. No gross coordination deficits.  MSK:  - Left knee: no erythema, warmth, ecchymosis. Mild-moderate effusion noted. Restricted flexion to approx 90degrees, negative Ant/Post drawer, lachman's test. No instability to varus/valgus stress testing.  - Right knee: no erythema, warmth, ecchymosis, or effusion. Restricted flexion to approx 115degrees, negative Ant/Post drawer, lachman's test. No instability to varus/valgus stress testing.   UC Treatments / Results  Labs (all labs ordered are listed, but only abnormal results are displayed) Labs Reviewed - No data to display  EKG   Radiology No results found.  Procedures Procedures (including critical care time): Left knee aspiration and CS injection:  After discussion on R/B/I, informed verbal consent timeout was performed, patient was lying supine on exam table.  The left knee was prepped with Betadine and alcohol swab.   Utilizing superolateral approach, 7 mL of lidocaine was used for local anesthesia.  Then using an 18g needle on 60cc syringe, approximately 38 mL of clear straw-colored fluid was aspirated from the left knee. Knee was then injected with 3:1 lidocaine:depomedrol. Patient tolerated procedure well without immediate complications.   Medications Ordered in UC Medications  methylPREDNISolone acetate (DEPO-MEDROL) injection 40 mg (has no administration in time range)  Initial Impression / Assessment and Plan / UC Course  I have reviewed the triage vital signs and the nursing notes.  Pertinent labs & imaging results that were available during my care of the patient were reviewed by me and considered in my medical decision making (see chart for details).     1.) Chronic bilateral knee pain with known primary osteoarthritis 2.) Left knee effusion  Patient presents with acute on chronic knee pain in the setting of known primary OA. Patient's left knee was much worse over the past few weeks and with associated effusion. No s/s of infection. Through shared decision making we elected to aspirate and then inject steroid into the knee for pain relief. He was cautioned on his consistent use of NSAIDs for gastritis/GI bleed/sequalae. PRICE therapy was recommended going forward. He is to follow-up with his regular orthopedic physician. We discussed no cortisone injection into knee for the next 3 months. Return precautions given.  Final Clinical Impressions(s) / UC Diagnoses   Final diagnoses:  Effusion of left knee  Chronic pain of both knees  Primary osteoarthritis of left knee  Primary osteoarthritis of right knee     Discharge Instructions      Ice knee for next 48 hrs May take tylenol or ibuprofen if painful If knee becomes red or hot, return to urgent care or ED right away     ED Prescriptions   None    PDMP not reviewed this encounter.   Elba Barman, [place] 05/14/21 2423

## 2021-05-14 NOTE — ED Triage Notes (Signed)
Pt presents with knee pain x 1 year. Pt states he is going to have his knee caps replaced.

## 2021-05-14 NOTE — Discharge Instructions (Signed)
Ice knee for next 48 hrs May take tylenol or ibuprofen if painful If knee becomes red or hot, return to urgent care or ED right away

## 2021-08-31 ENCOUNTER — Ambulatory Visit (HOSPITAL_COMMUNITY)
Admission: EM | Admit: 2021-08-31 | Discharge: 2021-08-31 | Disposition: A | Discharge status: Home / Self Care | Payer: PRIVATE HEALTH INSURANCE | Attending: Internal Medicine | Admitting: Internal Medicine

## 2021-08-31 ENCOUNTER — Encounter (HOSPITAL_COMMUNITY): Payer: Self-pay | Admitting: Emergency Medicine

## 2021-08-31 ENCOUNTER — Other Ambulatory Visit: Payer: Self-pay

## 2021-08-31 DIAGNOSIS — Z20822 Contact with and (suspected) exposure to covid-19: Secondary | ICD-10-CM | POA: Insufficient documentation

## 2021-08-31 NOTE — ED Triage Notes (Signed)
Pt reports wanting a covid test. Pt denies any symptoms at this time.

## 2021-09-01 LAB — SARS CORONAVIRUS 2 (TAT 6-24 HRS): SARS Coronavirus 2: NEGATIVE

## 2021-09-20 ENCOUNTER — Ambulatory Visit (HOSPITAL_COMMUNITY)
Admission: EM | Admit: 2021-09-20 | Discharge: 2021-09-20 | Disposition: A | Discharge status: Home / Self Care | Payer: PRIVATE HEALTH INSURANCE | Attending: Internal Medicine | Admitting: Internal Medicine

## 2021-09-20 ENCOUNTER — Other Ambulatory Visit: Payer: Self-pay

## 2021-09-20 ENCOUNTER — Encounter (HOSPITAL_COMMUNITY): Payer: Self-pay

## 2021-09-20 DIAGNOSIS — G8929 Other chronic pain: Secondary | ICD-10-CM

## 2021-09-20 DIAGNOSIS — M25562 Pain in left knee: Secondary | ICD-10-CM | POA: Diagnosis not present

## 2021-09-20 MED ORDER — PREDNISONE 10 MG (21) PO TBPK: ORAL_TABLET | Freq: Every day | ORAL | 0 refills | Status: DC

## 2021-09-20 NOTE — Discharge Instructions (Addendum)
Gentle range of motion exercises Icing of the right knee Do not take ibuprofen or Toradol or Advil while you are on steroids Follow-up with orthopedic surgery for knee replacement evaluation. Return to urgent care if symptoms worsen.

## 2021-09-20 NOTE — ED Triage Notes (Signed)
Pt reports left knee pain, he hit his knee on the steps last night. He is taking advil.

## 2021-09-20 NOTE — ED Provider Notes (Signed)
[facility]    CSN: [id number] Arrival date & time: 09/20/21  4854      History   Chief Complaint Chief Complaint  Patient presents with   Knee Pain    HPI Chris Fox is a 51 y.o. male with a history of chronic knee pain currently being managed on NSAIDs and knee brace use comes to urgent care with worsening left knee pain that happened after he fell on his left knee.  He fell on his left knee yesterday.  Pain is sharp in nature and of moderate severity.  He has tried NSAIDs and knee brace use with no improvement in symptoms.  Pain is aggravated by movement and is associated with some knee swelling.  No bruising on the left knee.  Patient follows up with orthopedic surgery and he is a candidate for knee replacement but because of insurance issues he has not been able to get a knee replacement done. HPI  Past Medical History:  Diagnosis Date   Abdominal pain    a. LLQ 11/2011   GERD (gastroesophageal reflux disease)    Gout    Headache(784.0)    Midsternal chest pain    a. Cardiac cath 12/10/11 with normal coronaries and LV systolic function;  12/2701 Echo EF 55-60%, no valvular abnormalities or pericardial effusion   Pericarditis    a. presumed - 11/2011   ST elevation    a. Anterior ST elevation ? early repolarization vs pericarditis    Patient Active Problem List   Diagnosis Date Noted   Macrocytic anemia 06/20/2020   Family history of colon cancer 06/20/2020   Rectal bleeding 06/20/2020   Foot abscess 08/25/2016   Gastroesophageal reflux disease 08/25/2016   Gout 08/25/2016   Midsternal chest pain    ST elevation    Abdominal pain    Chest pain 12/10/2011   Bradycardia 12/10/2011    Past Surgical History:  Procedure Laterality Date   CARDIAC CATHETERIZATION  12/10/11   normal coronaries and LV systolic function;  Echo with normal LV function, EF 55-60%, no valvular abnormalities or pericardial effusion   I & D EXTREMITY Right 08/27/2016    Procedure: IRRIGATION AND DEBRIDEMENT RIGHT FOOT;  Surgeon: Edrick Kins, DPM;  Location: [facility];  Service: Podiatry;  Laterality: Right;   LEFT HEART CATHETERIZATION WITH CORONARY ANGIOGRAM N/A 12/10/2011   Procedure: LEFT HEART CATHETERIZATION WITH CORONARY ANGIOGRAM;  Surgeon: Larey Dresser, MD;  Location: [facility] CATH LAB;  Service: Cardiovascular;  Laterality: N/A;       Home Medications    Prior to Admission medications   Medication Sig Start Date End Date Taking? Authorizing Provider  predniSONE (STERAPRED UNI-PAK 21 TAB) 10 MG (21) TBPK tablet Take by mouth daily. Take 6 tabs by mouth daily  for 2 days, then 5 tabs for 2 days, then 4 tabs for 2 days, then 3 tabs for 2 days, 2 tabs for 2 days, then 1 tab by mouth daily for 2 days 09/20/21  Yes Bryanda Mikel, Myrene Galas, MD  acetaminophen (TYLENOL) 500 MG tablet Take 500 mg by mouth. As Needed 04/18/20   [provider]  ketorolac (TORADOL) 10 MG tablet Take 1 tablet (10 mg total) by mouth every 6 (six) hours as needed. 02/26/21   Hazel Sams, PA-C    Family History Family History  Problem Relation Age of Onset   Diabetes Maternal Grandmother    Hypertension Maternal Grandmother    Colon cancer Mother    Hypertension  Mother    Colon cancer Maternal Grandfather    Rectal cancer Neg Hx    Stomach cancer Neg Hx    Esophageal cancer Neg Hx     Social History Social History   Tobacco Use   Smoking status: Former    Packs/day: 1.50    Years: 21.00    Pack years: 31.50    Types: Cigarettes    Quit date: 09/11/2002    Years since quitting: 19.0   Smokeless tobacco: Never  Vaping Use   Vaping Use: Never used  Substance Use Topics   Alcohol use: No   Drug use: No    Frequency: 20.0 times per week    Types: Marijuana, Other-see comments    Comment: quit 1 month ago     Allergies   Colchicine   Review of Systems Review of Systems  Cardiovascular: Negative.   Gastrointestinal: Negative.   Genitourinary: Negative.    Musculoskeletal:  Positive for arthralgias and joint swelling. Negative for neck pain and neck stiffness.    Physical Exam Triage Vital Signs ED Triage Vitals [09/20/21 1000]  Enc Vitals Group     BP 122/82     Pulse Rate (!) 52     Resp 16     Temp 98.1 F (36.7 C)     Temp Source Oral     SpO2 96 %     Weight      Height      Head Circumference      Peak Flow      Pain Score      Pain Loc      Pain Edu?      Excl. in [place]?    No data found.  Updated Vital Signs BP 122/82 (BP Location: Left Arm)    Pulse (!) 52    Temp 98.1 F (36.7 C) (Oral)    Resp 16    SpO2 96%   Visual Acuity Right Eye Distance:   Left Eye Distance:   Bilateral Distance:    Right Eye Near:   Left Eye Near:    Bilateral Near:     Physical Exam Vitals and nursing note reviewed.  Constitutional:      Appearance: Normal appearance.  Cardiovascular:     Rate and Rhythm: Normal rate and regular rhythm.  Musculoskeletal:        General: Swelling present. No tenderness, deformity or signs of injury. Normal range of motion.  Skin:    General: Skin is warm.     Findings: No bruising or erythema.  Neurological:     Mental Status: He is alert.     UC Treatments / Results  Labs (all labs ordered are listed, but only abnormal results are displayed) Labs Reviewed - No data to display  EKG   Radiology No results found.  Procedures Procedures (including critical care time)  Medications Ordered in UC Medications - No data to display  Initial Impression / Assessment and Plan / UC Course  I have reviewed the triage vital signs and the nursing notes.  Pertinent labs & imaging results that were available during my care of the patient were reviewed by me and considered in my medical decision making (see chart for details).     1.  Acute on chronic left knee pain: Tapering dose of prednisone Gentle range of motion exercises Icing of the left knee Return to urgent care if symptoms  worsens Follow-up with orthopedic surgery for further evaluation. Final Clinical Impressions(s) /  UC Diagnoses   Final diagnoses:  Chronic pain of left knee     Discharge Instructions      Gentle range of motion exercises Icing of the right knee Do not take ibuprofen or Toradol or Advil while you are on steroids Follow-up with orthopedic surgery for knee replacement evaluation. Return to urgent care if symptoms worsen.   ED Prescriptions     Medication Sig Dispense Auth. Provider   predniSONE (STERAPRED UNI-PAK 21 TAB) 10 MG (21) TBPK tablet Take by mouth daily. Take 6 tabs by mouth daily  for 2 days, then 5 tabs for 2 days, then 4 tabs for 2 days, then 3 tabs for 2 days, 2 tabs for 2 days, then 1 tab by mouth daily for 2 days 42 tablet Jasdeep Kepner, Myrene Galas, MD      PDMP not reviewed this encounter.   Chase Picket, MD 09/20/21 [phone]

## 2021-10-24 ENCOUNTER — Ambulatory Visit (HOSPITAL_COMMUNITY)
Admission: EM | Admit: 2021-10-24 | Discharge: 2021-10-24 | Disposition: A | Discharge status: Home / Self Care | Payer: PRIVATE HEALTH INSURANCE

## 2021-10-24 ENCOUNTER — Other Ambulatory Visit: Payer: Self-pay

## 2021-10-24 ENCOUNTER — Encounter (HOSPITAL_COMMUNITY): Payer: Self-pay

## 2021-10-24 DIAGNOSIS — K625 Hemorrhage of anus and rectum: Secondary | ICD-10-CM

## 2021-10-24 NOTE — ED Provider Notes (Signed)
Pt presents with c/o stomach pain x 1 week.  Pt states he has always had trouble with bowel movements and also reports a history of hemorrhoids. States that since he was given anti-inflammatory pain pills for his knee pain, he has had abdominal cramping and a significant amount of blood in the toilet water when moving his bowels every day.  Patient also reports he has noticed that lately he has been very lightheaded.  Patient advised to go to the emergency room for further evaluation of bright red blood per rectum. ?  ?Lynden Oxford Scales, PA-C ?10/24/21 2018 ? ?

## 2021-10-24 NOTE — ED Triage Notes (Signed)
Pt presents with c/o stomach pain x 1 week.  ? ?Pt states he has had trouble with bowel movements. States he has blood in his stool. States he was given pills for his knee pain and states since he has taken that he has had abdominal cramping and bloody stools every day.  ?

## 2021-10-24 NOTE — Discharge Instructions (Addendum)
Please go to the emergency room now for further evaluation. ?

## 2021-10-25 ENCOUNTER — Emergency Department (HOSPITAL_COMMUNITY): Payer: PRIVATE HEALTH INSURANCE

## 2021-10-25 ENCOUNTER — Other Ambulatory Visit: Payer: Self-pay

## 2021-10-25 ENCOUNTER — Emergency Department (HOSPITAL_COMMUNITY)
Admission: EM | Admit: 2021-10-25 | Discharge: 2021-10-25 | Disposition: A | Discharge status: Home / Self Care | Payer: PRIVATE HEALTH INSURANCE | Attending: Emergency Medicine | Admitting: Emergency Medicine

## 2021-10-25 ENCOUNTER — Encounter (HOSPITAL_COMMUNITY): Payer: Self-pay | Admitting: Radiology

## 2021-10-25 DIAGNOSIS — R202 Paresthesia of skin: Secondary | ICD-10-CM | POA: Insufficient documentation

## 2021-10-25 DIAGNOSIS — K644 Residual hemorrhoidal skin tags: Secondary | ICD-10-CM | POA: Insufficient documentation

## 2021-10-25 DIAGNOSIS — K649 Unspecified hemorrhoids: Secondary | ICD-10-CM

## 2021-10-25 DIAGNOSIS — R1031 Right lower quadrant pain: Secondary | ICD-10-CM | POA: Diagnosis not present

## 2021-10-25 DIAGNOSIS — K625 Hemorrhage of anus and rectum: Secondary | ICD-10-CM

## 2021-10-25 DIAGNOSIS — R42 Dizziness and giddiness: Secondary | ICD-10-CM | POA: Diagnosis not present

## 2021-10-25 DIAGNOSIS — R001 Bradycardia, unspecified: Secondary | ICD-10-CM | POA: Insufficient documentation

## 2021-10-25 LAB — CBC
HCT: 34 % — ABNORMAL LOW (ref 39.0–52.0)
Hemoglobin: 10.5 g/dL — ABNORMAL LOW (ref 13.0–17.0)
MCH: 31.1 pg (ref 26.0–34.0)
MCHC: 30.9 g/dL (ref 30.0–36.0)
MCV: 100.6 fL — ABNORMAL HIGH (ref 80.0–100.0)
Platelets: 188 10*3/uL (ref 150–400)
RBC: 3.38 MIL/uL — ABNORMAL LOW (ref 4.22–5.81)
RDW: 12.5 % (ref 11.5–15.5)
WBC: 5 10*3/uL (ref 4.0–10.5)
nRBC: 0 % (ref 0.0–0.2)

## 2021-10-25 LAB — COMPREHENSIVE METABOLIC PANEL
ALT: 32 U/L (ref 0–44)
AST: 34 U/L (ref 15–41)
Albumin: 3.5 g/dL (ref 3.5–5.0)
Alkaline Phosphatase: 54 U/L (ref 38–126)
Anion gap: 7 (ref 5–15)
BUN: 9 mg/dL (ref 6–20)
CO2: 27 mmol/L (ref 22–32)
Calcium: 8.6 mg/dL — ABNORMAL LOW (ref 8.9–10.3)
Chloride: 108 mmol/L (ref 98–111)
Creatinine, Ser: 0.91 mg/dL (ref 0.61–1.24)
GFR, Estimated: >60 mL/min (ref >60)
Glucose, Bld: 98 mg/dL (ref 70–99)
Potassium: 3.5 mmol/L (ref 3.5–5.1)
Sodium: 142 mmol/L (ref 135–145)
Total Bilirubin: 0.6 mg/dL (ref 0.3–1.2)
Total Protein: 6 g/dL — ABNORMAL LOW (ref 6.5–8.1)

## 2021-10-25 LAB — TYPE AND SCREEN
ABO/RH(D): O POS
Antibody Screen: NEGATIVE

## 2021-10-25 LAB — ABO/RH: ABO/RH(D): O POS

## 2021-10-25 LAB — POC OCCULT BLOOD, ED: Fecal Occult Bld: NEGATIVE

## 2021-10-25 MED ORDER — IOHEXOL 300 MG/ML  SOLN
100.0000 mL | Freq: Once | INTRAMUSCULAR | Status: AC | PRN
  Administered 2021-10-25: 100 mL via INTRAVENOUS

## 2021-10-25 NOTE — ED Provider Notes (Signed)
?[facility] ?Provider Note ? ? ?CSN: [id number] ?Arrival date & time: 10/25/21  8101 ? ?  ? ?History ?Chief Complaint  ?Patient presents with  ? GI Bleeding  ? ? ?Chris Fox is a 51 y.o. male with history of hemorrhoids and rectal bleeding presents to the emergency department for evaluation of right lower quadrant pain and bright red blood per rectum for the past 2 weeks.  He reports he has had this problem off and on for over a year now.  He denies any melena.  He reports he has been taking "2 Tylenol and 3 ibuprofen" every 2-3 hours occasionally for his knee pain.  His last colonoscopy reports was a year ago that just showed hemorrhoids and polyps.  He denies any dysuria, hematuria, fevers.  He reports he does have some lightheadedness off and on over the past 2 weeks as well.  He also reports some numbness in his fingertips bilaterally that is been going on for the past 1 to 2 weeks.  Denies any chest pain or shortness of breath.  Denies any weakness or headache. ? ?HPI ? ?  ? ?Home Medications ?Prior to Admission medications   ?Medication Sig Start Date End Date Taking? Authorizing Provider  ?acetaminophen (TYLENOL) 325 MG tablet Take 325 mg by mouth every 6 (six) hours as needed for headache or mild pain.   Yes [provider]  ?ibuprofen (ADVIL) 200 MG tablet Take 600 mg by mouth every 8 (eight) hours as needed for headache or mild pain.   Yes [provider]  ?naproxen sodium (ALEVE) 220 MG tablet Take 660 mg by mouth 2 (two) times daily as needed (Pain and headache).   Yes [provider]  ?predniSONE (STERAPRED UNI-PAK 21 TAB) 10 MG (21) TBPK tablet Take by mouth daily. Take 6 tabs by mouth daily  for 2 days, then 5 tabs for 2 days, then 4 tabs for 2 days, then 3 tabs for 2 days, 2 tabs for 2 days, then 1 tab by mouth daily for 2 days ?Patient not taking: Reported on 10/25/2021 09/20/21   Chase Picket, MD  ?   ? ?Allergies     ?Colchicine   ? ?Review of Systems   ?Review of Systems  ?Constitutional:  Negative for chills and fever.  ?Respiratory:  Negative for shortness of breath.   ?Cardiovascular:  Negative for chest pain.  ?Gastrointestinal:  Positive for abdominal pain, anal bleeding and blood in stool. Negative for constipation, diarrhea, nausea, rectal pain and vomiting.  ?Genitourinary:  Negative for dysuria and hematuria.  ?Musculoskeletal:  Positive for arthralgias. Negative for back pain and neck pain.  ?Neurological:  Positive for light-headedness and numbness. Negative for dizziness, weakness and headaches.  ? ?Physical Exam ?Updated Vital Signs ?BP 113/78   Pulse (!) 51   Temp 98.6 ?F (37 ?C) (Oral)   Resp 20   SpO2 99%  ?Physical Exam ?Vitals and nursing note reviewed. Exam conducted with a chaperone present (Paden, RN).  ?Constitutional:   ?   General: He is not in acute distress. ?   Appearance: Normal appearance. He is not ill-appearing or toxic-appearing.  ?HENT:  ?   Head: Normocephalic and atraumatic.  ?   Mouth/Throat:  ?   Mouth: Mucous membranes are moist.  ?Eyes:  ?   General: No scleral icterus. ?Cardiovascular:  ?   Rate and Rhythm: Regular rhythm. Bradycardia present.  ?   Pulses: Normal pulses.  ?Pulmonary:  ?  Effort: Pulmonary effort is normal. No respiratory distress.  ?   Breath sounds: Normal breath sounds.  ?Abdominal:  ?   General: Bowel sounds are normal.  ?   Palpations: Abdomen is soft.  ?   Tenderness: There is abdominal tenderness. There is no right CVA tenderness, left CVA tenderness, guarding or rebound.  ?   Comments: Diffuse abdominal tenderness but mainly over the right lower quadrant.  No guarding or rebound noted.  Abdomen is soft with normal active bowel sounds.  No overlying skin changes noted.  ?Genitourinary: ?   Comments: Multiple soft external hemorrhoids noted.  Nonthrombosed.  A hemorrhoid around the 9 o'clock position appears to have been bleeding at one point.  Hemoccult  negative. ?Musculoskeletal:     ?   General: Tenderness present. No deformity.  ?   Cervical back: Normal range of motion. No tenderness.  ?   Comments: Diffuse tenderness over his right knee.  No overlying erythema or warmth or swelling.  ?Skin: ?   General: Skin is warm and dry.  ?   Capillary Refill: Capillary refill takes less than 2 seconds.  ?Neurological:  ?   General: No focal deficit present.  ?   Mental Status: He is alert. Mental status is at baseline.  ?   Sensory: No sensory deficit.  ?   Motor: No weakness.  ?   Gait: Gait normal.  ?   Comments: Equal sensation to bilateral hands.  Sensation appears intact.  ? ? ?ED Results / Procedures / Treatments   ?Labs ?(all labs ordered are listed, but only abnormal results are displayed) ?Labs Reviewed  ?COMPREHENSIVE METABOLIC PANEL - Abnormal; Notable for the following components:  ?    Result Value  ? Calcium 8.6 (*)   ? Total Protein 6.0 (*)   ? All other components within normal limits  ?CBC - Abnormal; Notable for the following components:  ? RBC 3.38 (*)   ? Hemoglobin 10.5 (*)   ? HCT 34.0 (*)   ? MCV 100.6 (*)   ? All other components within normal limits  ?POC OCCULT BLOOD, ED  ?TYPE AND SCREEN  ?ABO/RH  ? ? ?EKG ?None ? ?Radiology ?CT ABDOMEN PELVIS W CONTRAST ? ?Result Date: 10/25/2021 ?CLINICAL DATA:  RLQ abdominal pain (Age >= 14y) EXAM: CT ABDOMEN AND PELVIS WITH CONTRAST TECHNIQUE: Multidetector CT imaging of the abdomen and pelvis was performed using the standard protocol following bolus administration of intravenous contrast. RADIATION DOSE REDUCTION: This exam was performed according to the departmental dose-optimization program which includes automated exposure control, adjustment of the mA and/or kV according to patient size and/or use of iterative reconstruction technique. CONTRAST:  179m OMNIPAQUE IOHEXOL 300 MG/ML  SOLN COMPARISON:  CT 01/27/2012 FINDINGS: Lower chest: No acute abnormality. Hepatobiliary: Hepatic steatosis. Small hepatic  cyst adjacent to the gallbladder. The gallbladder is unremarkable. Pancreas: Unremarkable. No pancreatic ductal dilatation or surrounding inflammatory changes. Spleen: Normal in size without focal abnormality. Adrenals/Urinary Tract: Adrenal glands are unremarkable. No hydronephrosis. There is a punctate nonobstructive right upper pole renal stone. Bladder is unremarkable. Stomach/Bowel: The stomach is within normal limits. There is no evidence of bowel obstruction.The appendix is normal. Scattered colonic diverticula. No diverticulitis. Vascular/Lymphatic: Aortoiliac atherosclerotic calcifications. No AAA. No lymphadenopathy. Reproductive: Unremarkable. Other: No abdominal wall hernia or abnormality. No abdominopelvic ascites. Musculoskeletal: No acute or significant osseous findings. IMPRESSION: No acute abdominopelvic abnormality.  Normal appendix. Punctate nonobstructive right upper pole renal stone. Hepatic steatosis. Electronically Signed  By: Maurine Simmering M.D.   On: 10/25/2021 09:07   ? ?Procedures ?Procedures  ? ?Medications Ordered in ED ?Medications  ?iohexol (OMNIPAQUE) 300 MG/ML solution 100 mL (100 mLs Intravenous Contrast Given 10/25/21 0859)  ? ? ?ED Course/ Medical Decision Making/ A&P ?  ?                        ?Medical Decision Making ?Amount and/or Complexity of Data Reviewed ?Labs: ordered. ?Radiology: ordered. ? ?Risk ?Prescription drug management. ? ? ?51 year old male with history of hemorrhoids and rectal bleeding presents emergency department for evaluation of right lower quadrant pain and rectal bleeding.  Differential diagnosis includes is not limited to hemorrhoids, diverticulitis, colitis, appendicitis, lower GI bleed.  Vital signs show bradycardia although in comparison to previous charts, he is also bradycardic as well.  Normotensive, afebrile, satting well on room air without any increased work of breathing.  Physical exam is pertinent for a soft abdomen with diffuse abdominal  tenderness but mainly over the right lower quadrant.  No guarding or rebound noted.  No overlying skin changes noted.  Normal active bowel sounds.  Patient has no tenderness to his neck or back.  He appears to have equal sensa

## 2021-10-25 NOTE — ED Notes (Signed)
Patient transported to CT 

## 2021-10-25 NOTE — Discharge Instructions (Signed)
Please follow up with your GI providers. Additionally, please have your blood counts checked in the next two days. If you have black or tarry stool, syncope, lightheadedness, please return to the ED. Please only take your medications as prescribed.  ? ?Contact a doctor if you: ?Have pain and swelling that do not get better with treatment or medicine. ?Have trouble pooping. ?Cannot poop. ?Have pain or swelling outside the area of the hemorrhoids. ?Get help right away if you have: ?Bleeding that will not stop. ?

## 2021-10-25 NOTE — ED Triage Notes (Signed)
Pt with RLQ pain and BRBPR every time he uses the bathroom x 1 week. Endorses lightheadedness and tingling in fingertips.  ?

## 2021-11-16 ENCOUNTER — Ambulatory Visit (HOSPITAL_COMMUNITY)
Admission: EM | Admit: 2021-11-16 | Discharge: 2021-11-16 | Disposition: A | Discharge status: Home / Self Care | Payer: PRIVATE HEALTH INSURANCE | Attending: Internal Medicine | Admitting: Internal Medicine

## 2021-11-16 ENCOUNTER — Encounter (HOSPITAL_COMMUNITY): Payer: Self-pay | Admitting: Emergency Medicine

## 2021-11-16 DIAGNOSIS — G8929 Other chronic pain: Secondary | ICD-10-CM

## 2021-11-16 DIAGNOSIS — M25562 Pain in left knee: Secondary | ICD-10-CM

## 2021-11-16 MED ORDER — METHYLPREDNISOLONE SODIUM SUCC 125 MG IJ SOLR
INTRAMUSCULAR | Status: AC
  Filled 2021-11-16: qty 2

## 2021-11-16 MED ORDER — METHYLPREDNISOLONE SODIUM SUCC 125 MG IJ SOLR
80.0000 mg | Freq: Once | INTRAMUSCULAR | Status: AC
  Administered 2021-11-16: 80 mg via INTRAMUSCULAR

## 2021-11-16 NOTE — ED Provider Notes (Signed)
?Chris Fox ? ? ? ?CSN: [id number] ?Arrival date & time: 11/16/21  1337 ? ? ?  ? ?History   ?Chief Complaint ?Chief Complaint  ?Patient presents with  ? Knee Pain  ? ? ?HPI ?Chris Fox is a 51 y.o. male.  ? ?Patient presents with 3-day history of left knee pain.  Denies any apparent injury.  Patient does have a history of chronic left knee pain related to osteoarthritis.  He reports that he has been told that he has to have a knee replacement but has not been scheduled yet as he does not have a primary care doctor to make this referral.  Denies any numbness or tingling.  Pain worsens with bearing weight.  Patient has taken over-the-counter NSAIDs with minimal improvement.  Denies fevers, body aches, chills. ? ? ?Knee Pain ? ?Past Medical History:  ?Diagnosis Date  ? Abdominal pain   ? a. LLQ 11/2011  ? GERD (gastroesophageal reflux disease)   ? Gout   ? Headache(784.0)   ? Midsternal chest pain   ? a. Cardiac cath 12/10/11 with normal coronaries and LV systolic function;  08/252 Echo EF 55-60%, no valvular abnormalities or pericardial effusion  ? Pericarditis   ? a. presumed - 11/2011  ? ST elevation   ? a. Anterior ST elevation ? early repolarization vs pericarditis  ? ? ?Patient Active Problem List  ? Diagnosis Date Noted  ? Macrocytic anemia 06/20/2020  ? Family history of colon cancer 06/20/2020  ? Rectal bleeding 06/20/2020  ? Foot abscess 08/25/2016  ? Gastroesophageal reflux disease 08/25/2016  ? Gout 08/25/2016  ? Midsternal chest pain   ? ST elevation   ? Abdominal pain   ? Chest pain 12/10/2011  ? Bradycardia 12/10/2011  ? ? ?Past Surgical History:  ?Procedure Laterality Date  ? CARDIAC CATHETERIZATION  12/10/11  ? normal coronaries and LV systolic function;  Echo with normal LV function, EF 55-60%, no valvular abnormalities or pericardial effusion  ? I & D EXTREMITY Right 08/27/2016  ? Procedure: IRRIGATION AND DEBRIDEMENT RIGHT FOOT;  Surgeon: Edrick Kins, DPM;  Location: [facility];  Service:  Podiatry;  Laterality: Right;  ? LEFT HEART CATHETERIZATION WITH CORONARY ANGIOGRAM N/A 12/10/2011  ? Procedure: LEFT HEART CATHETERIZATION WITH CORONARY ANGIOGRAM;  Surgeon: Larey Dresser, MD;  Location: [facility] C[facility];  Service: Cardiovascular;  Laterality: N/A;  ? ? ? ? ? ?Home Medications   ? ?Prior to Admission medications   ?Medication Sig Start Date End Date Taking? Authorizing Provider  ?acetaminophen (TYLENOL) 325 MG tablet Take 325 mg by mouth every 6 (six) hours as needed for headache or mild pain.    [provider]  ?ibuprofen (ADVIL) 200 MG tablet Take 600 mg by mouth every 8 (eight) hours as needed for headache or mild pain.    [provider]  ?naproxen sodium (ALEVE) 220 MG tablet Take 660 mg by mouth 2 (two) times daily as needed (Pain and headache).    [provider]  ?predniSONE (STERAPRED UNI-PAK 21 TAB) 10 MG (21) TBPK tablet Take by mouth daily. Take 6 tabs by mouth daily  for 2 days, then 5 tabs for 2 days, then 4 tabs for 2 days, then 3 tabs for 2 days, 2 tabs for 2 days, then 1 tab by mouth daily for 2 days ?Patient not taking: Reported on 10/25/2021 09/20/21   Chase Picket, MD  ? ? ?Family History ?Family History  ?Problem Relation Age of Onset  ?  Diabetes Maternal Grandmother   ? Hypertension Maternal Grandmother   ? Colon cancer Mother   ? Hypertension Mother   ? Colon cancer Maternal Grandfather   ? Rectal cancer Neg Hx   ? Stomach cancer Neg Hx   ? Esophageal cancer Neg Hx   ? ? ?Social History ?Social History  ? ?Tobacco Use  ? Smoking status: Former  ?  Packs/day: 1.50  ?  Years: 21.00  ?  Pack years: 31.50  ?  Types: Cigarettes  ?  Quit date: 09/11/2002  ?  Years since quitting: 19.1  ? Smokeless tobacco: Never  ?Vaping Use  ? Vaping Use: Never used  ?Substance Use Topics  ? Alcohol use: No  ? Drug use: No  ?  Frequency: 20.0 times per week  ?  Types: Marijuana, Other-see comments  ?  Comment: quit 1 month ago  ? ? ? ?Allergies   ?Colchicine ? ? ?Review  of Systems ?Review of Systems ?Per HPI ? ?Physical Exam ?Triage Vital Signs ?ED Triage Vitals  ?Enc Vitals Group  ?   BP 11/16/21 1354 118/76  ?   Pulse Rate 11/16/21 1354 63  ?   Resp 11/16/21 1354 16  ?   Temp 11/16/21 1354 98.9 ?F (37.2 ?C)  ?   Temp Source 11/16/21 1354 Oral  ?   SpO2 11/16/21 1353 96 %  ?   Weight 11/16/21 1353 166 lb 0.1 oz (75.3 kg)  ?   Height 11/16/21 1353 '5\' 7"'$  (1.702 m)  ?   Head Circumference --   ?   Peak Flow --   ?   Pain Score --   ?   Pain Loc --   ?   Pain Edu? --   ?   Excl. in [place]? --   ? ?No data found. ? ?Updated Vital Signs ?BP 118/76 (BP Location: Left Arm)   Pulse 63   Temp 98.9 ?F (37.2 ?C) (Oral)   Resp 16   Ht '5\' 7"'$  (1.702 m)   Wt 166 lb 0.1 oz (75.3 kg)   SpO2 96%   BMI 26.00 kg/m?  ? ?Visual Acuity ?Right Eye Distance:   ?Left Eye Distance:   ?Bilateral Distance:   ? ?Right Eye Near:   ?Left Eye Near:    ?Bilateral Near:    ? ?Physical Exam ?Constitutional:   ?   General: He is not in acute distress. ?   Appearance: Normal appearance. He is not toxic-appearing or diaphoretic.  ?HENT:  ?   Head: Normocephalic and atraumatic.  ?Eyes:  ?   Extraocular Movements: Extraocular movements intact.  ?   Conjunctiva/sclera: Conjunctivae normal.  ?Pulmonary:  ?   Effort: Pulmonary effort is normal.  ?Musculoskeletal:  ?   Left knee: Swelling and bony tenderness present. No deformity, effusion, erythema or crepitus. Normal range of motion. Tenderness present. No LCL laxity, MCL laxity, ACL laxity or PCL laxity.Normal alignment, normal meniscus and normal patellar mobility. Normal pulse.  ?Neurological:  ?   General: No focal deficit present.  ?   Mental Status: He is alert and oriented to person, place, and time. Mental status is at baseline.  ?Psychiatric:     ?   Mood and Affect: Mood normal.     ?   Behavior: Behavior normal.     ?   Thought Content: Thought content normal.     ?   Judgment: Judgment normal.  ? ? ? ?UC Treatments / Results  ?Labs ?(all  labs ordered are  listed, but only abnormal results are displayed) ?Labs Reviewed - No data to display ? ?EKG ? ? ?Radiology ?No results found. ? ?Procedures ?Procedures (including critical care time) ? ?Medications Ordered in UC ?Medications  ?methylPREDNISolone sodium succinate (SOLU-MEDROL) 125 mg/2 mL injection 80 mg (has no administration in time range)  ? ? ?Initial Impression / Assessment and Plan / UC Course  ?I have reviewed the triage vital signs and the nursing notes. ? ?Pertinent labs & imaging results that were available during my care of the patient were reviewed by me and considered in my medical decision making (see chart for details). ? ?  ? ?[facility] do IM Solu-Medrol today as patient has not previously tolerated oral steroids recently.  Discussed risk of chronic steroids for treatment of osteoarthritis with patient.  Although I do think this is the best treatment option today for patient.  Patient voiced understanding.  Patient was advised that he would need to see orthopedist for further evaluation and management given chronicity of issue.  Provided patient with contact information.  Discussed supportive care and ice application as well.  Knee brace applied in urgent care.  Do not think that imaging is necessary given that this is a chronic issue.  Discussed strict return precautions.  Patient verbalized understanding and was agreeable with plan. ?Final Clinical Impressions(s) / UC Diagnoses  ? ?Final diagnoses:  ?Chronic pain of left knee  ? ? ? ?Discharge Instructions   ? ?  ?Steroid injection was administered today in urgent care for your knee pain.  Knee brace has also been applied.  Please use ice application.  Follow-up with provided contact information for orthopedist for further evaluation and management. ? ? ? ?ED Prescriptions   ?None ?  ? ?PDMP not reviewed this encounter. ?  ?Teodora Medici, [place] ?11/16/21 1408 ? ?

## 2021-11-16 NOTE — Discharge Instructions (Signed)
Steroid injection was administered today in urgent care for your knee pain.  Knee brace has also been applied.  Please use ice application.  Follow-up with provided contact information for orthopedist for further evaluation and management. ?

## 2021-11-16 NOTE — ED Triage Notes (Signed)
Pt reports left knee pain x 3 days. Pt states knee feels like it has fluid in it and describes pain as a burning sensation. Also reports he is supposed to have a knee replacement but it's not yet scheduled.  ?

## 2021-12-20 ENCOUNTER — Encounter (HOSPITAL_COMMUNITY): Payer: Self-pay | Admitting: Emergency Medicine

## 2021-12-20 ENCOUNTER — Ambulatory Visit (HOSPITAL_COMMUNITY)
Admission: EM | Admit: 2021-12-20 | Discharge: 2021-12-20 | Disposition: A | Discharge status: Home / Self Care | Payer: PRIVATE HEALTH INSURANCE

## 2021-12-20 DIAGNOSIS — R1031 Right lower quadrant pain: Secondary | ICD-10-CM | POA: Diagnosis not present

## 2021-12-20 NOTE — Discharge Instructions (Addendum)
Please drink lots of water and try to rest. You can try hot pad on the area.  If symptoms continue or worsen, please go to the emergency department for further evaluation.

## 2021-12-20 NOTE — ED Provider Notes (Signed)
[place]    CSN: [id number] Arrival date & time: 12/20/21  1242     History   Chief Complaint Chief Complaint  Patient presents with   Abdominal Pain    HPI Chris Fox is a 51 y.o. male.  Patient presents with a 1 week history of right lower abdominal pain.  Feels the pain is worse with motion.  Denies nausea, vomiting, diarrhea.  No fever.  He has tried Tylenol and ibuprofen with no relief.  He has had normal bowel movements, no blood in the stool.  Last bowel movement this morning.  Denies any dysuria, urgency, frequency.  Reports he never drinks water. No recent travel.  No back pain, chest pain.  No penile discharge or irritation.  History of rectal bleeding and hemorrhoids. No hx abdominal surgeries   Past Medical History:  Diagnosis Date   Abdominal pain    a. LLQ 11/2011   GERD (gastroesophageal reflux disease)    Gout    Headache(784.0)    Midsternal chest pain    a. Cardiac cath 12/10/11 with normal coronaries and LV systolic function;  02/6766 Echo EF 55-60%, no valvular abnormalities or pericardial effusion   Pericarditis    a. presumed - 11/2011   ST elevation    a. Anterior ST elevation ? early repolarization vs pericarditis    Patient Active Problem List   Diagnosis Date Noted   Macrocytic anemia 06/20/2020   Family history of colon cancer 06/20/2020   Rectal bleeding 06/20/2020   Foot abscess 08/25/2016   Gastroesophageal reflux disease 08/25/2016   Gout 08/25/2016   Midsternal chest pain    ST elevation    Abdominal pain    Chest pain 12/10/2011   Bradycardia 12/10/2011    Past Surgical History:  Procedure Laterality Date   CARDIAC CATHETERIZATION  12/10/11   normal coronaries and LV systolic function;  Echo with normal LV function, EF 55-60%, no valvular abnormalities or pericardial effusion   I & D EXTREMITY Right 08/27/2016   Procedure: IRRIGATION AND DEBRIDEMENT RIGHT FOOT;  Surgeon: Edrick Kins, DPM;  Location: [place];   Service: Podiatry;  Laterality: Right;   LEFT HEART CATHETERIZATION WITH CORONARY ANGIOGRAM N/A 12/10/2011   Procedure: LEFT HEART CATHETERIZATION WITH CORONARY ANGIOGRAM;  Surgeon: Larey Dresser, MD;  Location: [facility] CATH LAB;  Service: Cardiovascular;  Laterality: N/A;       Home Medications    Prior to Admission medications   Medication Sig Start Date End Date Taking? Authorizing Provider  acetaminophen (TYLENOL) 325 MG tablet Take 325 mg by mouth every 6 (six) hours as needed for headache or mild pain.    [provider]  ibuprofen (ADVIL) 200 MG tablet Take 600 mg by mouth every 8 (eight) hours as needed for headache or mild pain.    [provider]  naproxen sodium (ALEVE) 220 MG tablet Take 660 mg by mouth 2 (two) times daily as needed (Pain and headache).    [provider]  predniSONE (STERAPRED UNI-PAK 21 TAB) 10 MG (21) TBPK tablet Take by mouth daily. Take 6 tabs by mouth daily  for 2 days, then 5 tabs for 2 days, then 4 tabs for 2 days, then 3 tabs for 2 days, 2 tabs for 2 days, then 1 tab by mouth daily for 2 days Patient not taking: Reported on 10/25/2021 09/20/21   Chase Picket, MD    Family History Family History  Problem Relation Age of Onset  Diabetes Maternal Grandmother    Hypertension Maternal Grandmother    Colon cancer Mother    Hypertension Mother    Colon cancer Maternal Grandfather    Rectal cancer Neg Hx    Stomach cancer Neg Hx    Esophageal cancer Neg Hx     Social History Social History   Tobacco Use   Smoking status: Former    Packs/day: 1.50    Years: 21.00    Pack years: 31.50    Types: Cigarettes    Quit date: 09/11/2002    Years since quitting: 19.2   Smokeless tobacco: Never  Vaping Use   Vaping Use: Never used  Substance Use Topics   Alcohol use: No   Drug use: No    Frequency: 20.0 times per week    Types: Marijuana, Other-see comments    Comment: quit 1 month ago     Allergies    Colchicine   Review of Systems Review of Systems  Gastrointestinal:  Positive for abdominal pain.   As per HPI  Physical Exam Triage Vital Signs ED Triage Vitals  Enc Vitals Group     BP 12/20/21 1324 (!) 121/56     Pulse Rate 12/20/21 1324 62     Resp 12/20/21 1324 17     Temp --      Temp src --      SpO2 12/20/21 1324 100 %     Weight 12/20/21 1325 166 lb 0.1 oz (75.3 kg)     Height 12/20/21 1325 '5\' 7"'$  (1.702 m)     Head Circumference --      Peak Flow --      Pain Score 12/20/21 1325 8     Pain Loc --      Pain Edu? --      Excl. in [place]? --    No data found.  Updated Vital Signs BP (!) 121/56 (BP Location: Right Arm)   Pulse 62   Resp 17   Ht '5\' 7"'$  (1.702 m)   Wt 166 lb 0.1 oz (75.3 kg)   SpO2 100%   BMI 26.00 kg/m    Physical Exam Vitals and nursing note reviewed.  Constitutional:      Appearance: Normal appearance.  HENT:     Mouth/Throat:     Mouth: Mucous membranes are moist.     Pharynx: Oropharynx is clear.  Eyes:     Conjunctiva/sclera: Conjunctivae normal.  Cardiovascular:     Rate and Rhythm: Normal rate and regular rhythm.     Heart sounds: Normal heart sounds.  Pulmonary:     Effort: Pulmonary effort is normal. No respiratory distress.     Breath sounds: Normal breath sounds.  Abdominal:     General: Bowel sounds are normal.     Palpations: Abdomen is soft.     Tenderness: There is abdominal tenderness in the right lower quadrant. There is no right CVA tenderness, left CVA tenderness, guarding or rebound.     Comments: Reports 8 out of 10 abdominal pain with palpation of right lower quadrant.  No obvious mass felt or seen.  No rash  Musculoskeletal:        General: Normal range of motion.  Neurological:     Mental Status: He is alert and oriented to person, place, and time.     UC Treatments / Results  Labs (all labs ordered are listed, but only abnormal results are displayed) Labs Reviewed - No data to  display  EKG  Radiology No results found.  Procedures Procedures (including critical care time)  Medications Ordered in UC Medications - No data to display  Initial Impression / Assessment and Plan / UC Course  I have reviewed the triage vital signs and the nursing notes.  Pertinent labs & imaging results that were available during my care of the patient were reviewed by me and considered in my medical decision making (see chart for details).  Apart from the abdominal pain he is otherwise asymptomatic.  Patient reports abdominal pain is 4/10 while resting.  At this time unknown etiology of abdominal pain.  I discussed with the patient it is hard to say what is going on without getting imaging of his belly.  Patient understands that we do not have imaging modalities in this urgent clinic that would be diagnostic.  I discussed with patient he can go to the emergency department for further imaging of the abdomen.  Patient states he feels okay at rest.  I feel comfortable letting him go home to rest and drink plenty of water.  He can try a hot pad to the area.  We discussed that if symptoms are persistent or they get worse he needs to go to the emergency department immediately.  Patient understands return precautions.  He agrees to plan and is discharged in stable condition.  Final Clinical Impressions(s) / UC Diagnoses   Final diagnoses:  Right lower quadrant abdominal pain     Discharge Instructions      Please drink lots of water and try to rest. You can try hot pad on the area.  If symptoms continue or worsen, please go to the emergency department for further evaluation.     ED Prescriptions   None    PDMP not reviewed this encounter.   Zanayah Shadowens, [place], [place] 12/20/21 1407

## 2021-12-20 NOTE — ED Triage Notes (Signed)
Pt reports RLQ abdominal pain since last week. Denies any n/v/d. States have tried OTC pain medication with no relief.

## 2022-01-04 ENCOUNTER — Emergency Department (HOSPITAL_COMMUNITY)
Admission: EM | Admit: 2022-01-04 | Discharge: 2022-01-04 | Disposition: A | Discharge status: Home / Self Care | Payer: PRIVATE HEALTH INSURANCE | Attending: Emergency Medicine | Admitting: Emergency Medicine

## 2022-01-04 ENCOUNTER — Other Ambulatory Visit: Payer: Self-pay

## 2022-01-04 ENCOUNTER — Emergency Department (HOSPITAL_COMMUNITY): Payer: PRIVATE HEALTH INSURANCE

## 2022-01-04 DIAGNOSIS — R300 Dysuria: Secondary | ICD-10-CM | POA: Insufficient documentation

## 2022-01-04 DIAGNOSIS — N200 Calculus of kidney: Secondary | ICD-10-CM | POA: Insufficient documentation

## 2022-01-04 DIAGNOSIS — R1031 Right lower quadrant pain: Secondary | ICD-10-CM

## 2022-01-04 DIAGNOSIS — E876 Hypokalemia: Secondary | ICD-10-CM | POA: Insufficient documentation

## 2022-01-04 DIAGNOSIS — D649 Anemia, unspecified: Secondary | ICD-10-CM | POA: Diagnosis not present

## 2022-01-04 LAB — URINALYSIS, ROUTINE W REFLEX MICROSCOPIC
Bilirubin Urine: NEGATIVE
Glucose, UA: NEGATIVE mg/dL
Hgb urine dipstick: NEGATIVE
Ketones, ur: 5 mg/dL — AB
Leukocytes,Ua: NEGATIVE
Nitrite: NEGATIVE
Protein, ur: NEGATIVE mg/dL
Specific Gravity, Urine: >1.046 — ABNORMAL HIGH (ref 1.005–1.030)
pH: 6 (ref 5.0–8.0)

## 2022-01-04 LAB — CBC WITH DIFFERENTIAL/PLATELET
Abs Immature Granulocytes: 0.01 10*3/uL (ref 0.00–0.07)
Basophils Absolute: 0.1 10*3/uL (ref 0.0–0.1)
Basophils Relative: 1 %
Eosinophils Absolute: 0.2 10*3/uL (ref 0.0–0.5)
Eosinophils Relative: 4 %
HCT: 36.7 % — ABNORMAL LOW (ref 39.0–52.0)
Hemoglobin: 11.8 g/dL — ABNORMAL LOW (ref 13.0–17.0)
Immature Granulocytes: 0 %
Lymphocytes Relative: 36 %
Lymphs Abs: 1.8 10*3/uL (ref 0.7–4.0)
MCH: 30.9 pg (ref 26.0–34.0)
MCHC: 32.2 g/dL (ref 30.0–36.0)
MCV: 96.1 fL (ref 80.0–100.0)
Monocytes Absolute: 0.5 10*3/uL (ref 0.1–1.0)
Monocytes Relative: 9 %
Neutro Abs: 2.5 10*3/uL (ref 1.7–7.7)
Neutrophils Relative %: 50 %
Platelets: 191 10*3/uL (ref 150–400)
RBC: 3.82 MIL/uL — ABNORMAL LOW (ref 4.22–5.81)
RDW: 11.9 % (ref 11.5–15.5)
WBC: 5 10*3/uL (ref 4.0–10.5)
nRBC: 0 % (ref 0.0–0.2)

## 2022-01-04 LAB — COMPREHENSIVE METABOLIC PANEL
ALT: 13 U/L (ref 0–44)
AST: 23 U/L (ref 15–41)
Albumin: 3.9 g/dL (ref 3.5–5.0)
Alkaline Phosphatase: 66 U/L (ref 38–126)
Anion gap: 6 (ref 5–15)
BUN: 10 mg/dL (ref 6–20)
CO2: 25 mmol/L (ref 22–32)
Calcium: 8.8 mg/dL — ABNORMAL LOW (ref 8.9–10.3)
Chloride: 107 mmol/L (ref 98–111)
Creatinine, Ser: 0.95 mg/dL (ref 0.61–1.24)
GFR, Estimated: >60 mL/min (ref >60)
Glucose, Bld: 89 mg/dL (ref 70–99)
Potassium: 3.4 mmol/L — ABNORMAL LOW (ref 3.5–5.1)
Sodium: 138 mmol/L (ref 135–145)
Total Bilirubin: 0.7 mg/dL (ref 0.3–1.2)
Total Protein: 6.7 g/dL (ref 6.5–8.1)

## 2022-01-04 LAB — LIPASE, BLOOD: Lipase: 21 U/L (ref 11–51)

## 2022-01-04 MED ORDER — IOHEXOL 350 MG/ML SOLN
100.0000 mL | Freq: Once | INTRAVENOUS | Status: AC | PRN
  Administered 2022-01-04: 100 mL via INTRAVENOUS

## 2022-01-04 NOTE — Discharge Instructions (Signed)
You were seen today for abdominal pain. Your workup was reassuring for no signs of appendicitis, cholecystitis, pancreatitis, or other life threatening cause. You did appear to be dehydrated on your urinalysis. I do recommend drinking more water. I have provided information for the [facility]. You may contact them to work on establishing primary care. I recommend follow up with a primary care provider if possible.

## 2022-01-04 NOTE — ED Provider Triage Note (Signed)
Emergency Medicine Provider Triage Evaluation Note  Chris Fox , a 51 y.o. male  was evaluated in triage.  Pt complains of RLQ abdominal pain for the past 10 days. He reports radiating pain to his back. Reports "almost pain" with urination. Denies any fevers, nausea, or vomiting. Denies any testicular or penile involvement.  Review of Systems  Positive:  Negative:   Physical Exam  BP 115/80 (BP Location: Left Arm)   Pulse (!) 52   Temp 98.4 F (36.9 C) (Oral)   Resp 20   SpO2 99%  Gen:   Awake, no distress   Resp:  Normal effort  MSK:   Moves extremities without difficulty  Other:  Abdomen soft with RLQ tenderness no guarding.   Medical Decision Making  Medically screening exam initiated at 10:06 AM.  Appropriate orders placed.  Chris Fox was informed that the remainder of the evaluation will be completed by another provider, this initial triage assessment does not replace that evaluation, and the importance of remaining in the ED until their evaluation is complete.  Will order CT with contrast for appendicitis vs. Renal stone r/o although likely not appendicitis given time frame. Patient well appearing with stable vitals.    Sherrell Puller, PA-C 01/04/22 1009

## 2022-01-04 NOTE — ED Triage Notes (Signed)
Pt. Stated, ive had stomach pain mostly on the right. Also my pee doesn't feel right comeing out, it doesn't burn or hurt just doesn't feel right.

## 2022-01-04 NOTE — ED Provider Notes (Signed)
Patient care taken from [place], [place] at shift handoff.  In short, patient is a 51 y.o. male complaining of RLQ pain. Pain ongoing for one week, described as moderate severity and sharp. Denies radiation, aggravating, or alleviating factors. Slight dysuria.   Physical Exam  BP 138/90 (BP Location: Left Arm)   Pulse (!) 46   Temp 98.4 F (36.9 C) (Oral)   Resp 16   Ht '5\' 8"'$  (1.727 m)   Wt 74.8 kg   SpO2 100%   BMI 25.09 kg/m   Physical Exam HENT:     Head: Normocephalic.  Cardiovascular:     Rate and Rhythm: Normal rate.  Pulmonary:     Effort: Pulmonary effort is normal.  Abdominal:     General: Abdomen is flat.     Tenderness: There is abdominal tenderness in the right lower quadrant.  Neurological:     Mental Status: He is alert.     Procedures  Procedures  ED Course / MDM   Clinical Course as of 01/04/22 1511  Fri Jan 04, 2022  1329 CBC with Differential(!) CBC without leukocytosis.  Mild anemia of 11.8, similar to prior.  No thrombocytopenia. [BM]  1330 Comprehensive metabolic panel(!) Mild hypokalemia of 3.4.  No emergent electrolyte derangement, AKI, LFT elevations or gap. [BM]    Clinical Course User Index [BM] Deliah Boston, PA-C   Medical Decision Making Amount and/or Complexity of Data Reviewed Labs:  Decision-making details documented in ED Course.   I reviewed lab results. Lipase 21, urinalysis with specific gravity >1.046.   No signs of UTI on urinalysis. No hematuria. Signs of possible dehydration.  Lipase normal, no sign of pancreatitis.   No life-threatening, acute cause of the patient's pain is apparent. CT abdomen/pelvis with normal appendix, no acute findings, nonobstructive right nephrolithiasis without hydronephrosis.   At this time the patient is stable for discharge. Return precautions discussed. I did provide information for the [facility] to help establish primary care in the future.         Dorothyann Peng, PA-C 01/04/22 1636    Fredia Sorrow, MD 01/07/22 1235

## 2022-01-04 NOTE — ED Provider Notes (Signed)
[place] EMERGENCY DEPARTMENT Provider Note   CSN: [id number] Arrival date & time: 01/04/22  0957     History  Chief Complaint  Patient presents with   Abdominal Pain    Chris Fox is a 51 y.o. male reports is otherwise healthy no daily medication use presented for evaluation of right lower quadrant abdominal pain onset 1 week ago he describes pain as a moderate intensity sharp sensation nonradiating without clear aggravating or alleviating factors.  Patient reports associated with a slight dysuria which is been ongoing for the past week as well.  Denies similar in the past.  Patient denies fall/injury, fever, chills, nausea/vomiting, diarrhea, hematuria, testicular pain and swelling or any additional concerns.  HPI     Home Medications Prior to Admission medications   Medication Sig Start Date End Date Taking? Authorizing Provider  acetaminophen (TYLENOL) 325 MG tablet Take 325 mg by mouth every 6 (six) hours as needed for headache or mild pain.    [provider]  ibuprofen (ADVIL) 200 MG tablet Take 600 mg by mouth every 8 (eight) hours as needed for headache or mild pain.    [provider]  naproxen sodium (ALEVE) 220 MG tablet Take 660 mg by mouth 2 (two) times daily as needed (Pain and headache).    [provider]  predniSONE (STERAPRED UNI-PAK 21 TAB) 10 MG (21) TBPK tablet Take by mouth daily. Take 6 tabs by mouth daily  for 2 days, then 5 tabs for 2 days, then 4 tabs for 2 days, then 3 tabs for 2 days, 2 tabs for 2 days, then 1 tab by mouth daily for 2 days Patient not taking: Reported on 10/25/2021 09/20/21   Chase Picket, MD      Allergies    Colchicine    Review of Systems   Review of Systems Ten systems are reviewed and are negative for acute change except as noted in the HPI  Physical Exam Updated Vital Signs BP 115/80 (BP Location: Left Arm)   Pulse (!) 52   Temp 98.4 F (36.9 C) (Oral)   Resp 20   Ht  '5\' 8"'$  (1.727 m)   Wt 74.8 kg   SpO2 99%   BMI 25.09 kg/m  Physical Exam Constitutional:      General: He is not in acute distress.    Appearance: Normal appearance. He is well-developed. He is not ill-appearing or diaphoretic.  HENT:     Head: Normocephalic and atraumatic.  Eyes:     General: Vision grossly intact. Gaze aligned appropriately.     Pupils: Pupils are equal, round, and reactive to light.  Neck:     Trachea: Trachea and phonation normal.  Pulmonary:     Effort: Pulmonary effort is normal. No respiratory distress.  Abdominal:     General: There is no distension.     Palpations: Abdomen is soft.     Tenderness: There is abdominal tenderness in the right lower quadrant. There is no guarding or rebound. Positive signs include McBurney's sign. Negative signs include Murphy's sign.  Musculoskeletal:        General: Normal range of motion.     Cervical back: Normal range of motion.  Skin:    General: Skin is warm and dry.  Neurological:     Mental Status: He is alert.     GCS: GCS eye subscore is 4. GCS verbal subscore is 5. GCS motor subscore is 6.     Comments:  Speech is clear and goal oriented, follows commands Major Cranial nerves without deficit, no facial droop Moves extremities without ataxia, coordination intact  Psychiatric:        Behavior: Behavior normal.     ED Results / Procedures / Treatments   Labs (all labs ordered are listed, but only abnormal results are displayed) Labs Reviewed  CBC WITH DIFFERENTIAL/PLATELET - Abnormal; Notable for the following components:      Result Value   RBC 3.82 (*)    Hemoglobin 11.8 (*)    HCT 36.7 (*)    All other components within normal limits  COMPREHENSIVE METABOLIC PANEL - Abnormal; Notable for the following components:   Potassium 3.4 (*)    Calcium 8.8 (*)    All other components within normal limits  URINALYSIS, ROUTINE W REFLEX MICROSCOPIC    EKG None  Radiology No results  found.  Procedures Procedures    Medications Ordered in ED Medications - No data to display  ED Course/ Medical Decision Making/ A&P Clinical Course as of 01/04/22 1330  Fri Jan 04, 2022  1329 CBC with Differential(!) CBC without leukocytosis.  Mild anemia of 11.8, similar to prior.  No thrombocytopenia. [BM]  1330 Comprehensive metabolic panel(!) Mild hypokalemia of 3.4.  No emergent electrolyte derangement, AKI, LFT elevations or gap. [BM]    Clinical Course User Index [BM] Gari Crown                           Medical Decision Making 51 year old male reports is otherwise healthy presented for right lower quadrant abdominal pain ongoing for the past week associate with dysuria.  On evaluation patient stable appearing no acute distress.  He is tender to the right lower quadrant.  CT abdomen pelvis did not show any evidence for appendicitis did show right nephrolithiasis which was nonobstructive.  Currently awaiting urinalysis to assess for UTI as well as lipase to rule out pancreatitis.  Care handoff given to Chris June PA-C at shift change.  Plan is to follow-up on pending labs, reassess, pending no emergent findings possible discharge.  Final disposition per oncoming team  Amount and/or Complexity of Data Reviewed Labs:  Decision-making details documented in ED Course.      Note: Portions of this report may have been transcribed using voice recognition software. Every effort was made to ensure accuracy; however, inadvertent computerized transcription errors may still be present.         Final Clinical Impression(s) / ED Diagnoses Final diagnoses:  None    Rx / DC Orders ED Discharge Orders     None         Gari Crown 01/04/22 1523    Ezequiel Essex, MD 01/04/22 1849

## 2022-01-04 NOTE — ED Notes (Signed)
Patient transported to CT 

## 2022-02-04 ENCOUNTER — Ambulatory Visit (HOSPITAL_COMMUNITY)
Admission: EM | Admit: 2022-02-04 | Discharge: 2022-02-04 | Disposition: A | Discharge status: Home / Self Care | Payer: PRIVATE HEALTH INSURANCE | Attending: Internal Medicine | Admitting: Internal Medicine

## 2022-02-04 ENCOUNTER — Encounter (HOSPITAL_COMMUNITY): Payer: Self-pay | Admitting: *Deleted

## 2022-02-04 ENCOUNTER — Other Ambulatory Visit: Payer: Self-pay

## 2022-02-04 DIAGNOSIS — R079 Chest pain, unspecified: Secondary | ICD-10-CM

## 2022-02-04 DIAGNOSIS — K219 Gastro-esophageal reflux disease without esophagitis: Secondary | ICD-10-CM | POA: Diagnosis not present

## 2022-02-04 MED ORDER — FAMOTIDINE 40 MG PO TABS: 40.0000 mg | ORAL_TABLET | Freq: Every day | ORAL | 0 refills | Status: DC

## 2022-02-04 MED ORDER — ALUM & MAG HYDROXIDE-SIMETH 200-200-20 MG/5ML PO SUSP
ORAL | Status: AC
  Filled 2022-02-04: qty 30

## 2022-02-04 MED ORDER — LIDOCAINE VISCOUS HCL 2 % MT SOLN
15.0000 mL | Freq: Once | OROMUCOSAL | Status: AC
  Administered 2022-02-04: 15 mL via ORAL

## 2022-02-04 MED ORDER — ALUM & MAG HYDROXIDE-SIMETH 200-200-20 MG/5ML PO SUSP
30.0000 mL | Freq: Once | ORAL | Status: AC
Start: 2022-02-04 — End: 2022-02-04
  Administered 2022-02-04: 30 mL via ORAL

## 2022-02-04 MED ORDER — LIDOCAINE VISCOUS HCL 2 % MT SOLN
OROMUCOSAL | Status: AC
  Filled 2022-02-04: qty 15

## 2022-02-04 NOTE — ED Provider Notes (Signed)
[place]    CSN: [id number] Arrival date & time: 02/04/22  1155      History   Chief Complaint Chief Complaint  Patient presents with   Chest Pain   Back Pain    HPI Chris Fox is a 51 y.o. male.   Patient presents today with a several hour history of intermittent chest discomfort and back discomfort.  Reports that symptoms began earlier this morning and woke him up.  He initially was having a burning/stabbing sensation in his chest with associated shortness of breath.  This lasted for several minutes and then resolved without intervention.  Reports he was sleeping at the time that symptoms began and they were not associated with activity.  This has since resolved but he is now experiencing some thoracic/lower back pain.  He reports this is like a pinching/discomfort.  He denies any known injury or increase in activity prior to symptom onset.  He does have a history of GERD but is unsure if that could be causing symptoms.  Denies any increased alcohol consumption or regular NSAID use.  He has not tried any over-the-counter medication for symptom management.  He denies any headache, dizziness, nausea, vomiting, melena, hematochezia.  He was unable to attend work as a result of symptoms and is requesting a work excuse note.  He does have a history of cardiac catheterization in 2013 but has not been followed by cardiology more recently.  He does report mild back pain as well as an uncomfortable sensation in his chest with deep breathing but otherwise is asymptomatic and denies any ongoing chest pain, shortness of breath, palpitations, lightheadedness, weakness, diaphoresis.    Past Medical History:  Diagnosis Date   Abdominal pain    a. LLQ 11/2011   GERD (gastroesophageal reflux disease)    Gout    Headache(784.0)    Midsternal chest pain    a. Cardiac cath 12/10/11 with normal coronaries and LV systolic function;  01/8294 Echo EF 55-60%, no valvular abnormalities or  pericardial effusion   Pericarditis    a. presumed - 11/2011   ST elevation    a. Anterior ST elevation ? early repolarization vs pericarditis    Patient Active Problem List   Diagnosis Date Noted   Macrocytic anemia 06/20/2020   Family history of colon cancer 06/20/2020   Rectal bleeding 06/20/2020   Foot abscess 08/25/2016   Gastroesophageal reflux disease 08/25/2016   Gout 08/25/2016   Midsternal chest pain    ST elevation    Abdominal pain    Chest pain 12/10/2011   Bradycardia 12/10/2011    Past Surgical History:  Procedure Laterality Date   CARDIAC CATHETERIZATION  12/10/11   normal coronaries and LV systolic function;  Echo with normal LV function, EF 55-60%, no valvular abnormalities or pericardial effusion   I & D EXTREMITY Right 08/27/2016   Procedure: IRRIGATION AND DEBRIDEMENT RIGHT FOOT;  Surgeon: Edrick Kins, DPM;  Location: [facility];  Service: Podiatry;  Laterality: Right;   LEFT HEART CATHETERIZATION WITH CORONARY ANGIOGRAM N/A 12/10/2011   Procedure: LEFT HEART CATHETERIZATION WITH CORONARY ANGIOGRAM;  Surgeon: Larey Dresser, MD;  Location: [facility] CATH LAB;  Service: Cardiovascular;  Laterality: N/A;       Home Medications    Prior to Admission medications   Medication Sig Start Date End Date Taking? Authorizing Provider  famotidine (PEPCID) 40 MG tablet Take 1 tablet (40 mg total) by mouth at bedtime. 02/04/22  Yes Tahmid Stonehocker, Derry Skill, PA-C  ibuprofen (ADVIL) 200 MG tablet Take 400 mg by mouth every 8 (eight) hours as needed for headache or mild pain.    [provider]    Family History Family History  Problem Relation Age of Onset   Diabetes Maternal Grandmother    Hypertension Maternal Grandmother    Colon cancer Mother    Hypertension Mother    Colon cancer Maternal Grandfather    Rectal cancer Neg Hx    Stomach cancer Neg Hx    Esophageal cancer Neg Hx     Social History Social History   Tobacco Use   Smoking status: Former    Packs/day:  1.50    Years: 21.00    Total pack years: 31.50    Types: Cigarettes    Quit date: 09/11/2002    Years since quitting: 19.4   Smokeless tobacco: Never  Vaping Use   Vaping Use: Never used  Substance Use Topics   Alcohol use: No   Drug use: No    Frequency: 20.0 times per week    Types: Marijuana, Other-see comments    Comment: quit 1 month ago     Allergies   Colchicine   Review of Systems Review of Systems  Constitutional:  Positive for activity change. Negative for appetite change, fatigue and fever.  Respiratory:  Positive for chest tightness (With deep breathing). Negative for cough and shortness of breath (Resolved).   Cardiovascular:  Negative for chest pain (Resolved), palpitations and leg swelling.  Gastrointestinal:  Negative for abdominal pain, diarrhea, nausea and vomiting.  Musculoskeletal:  Positive for back pain. Negative for arthralgias and myalgias.  Neurological:  Negative for dizziness, light-headedness and headaches.     Physical Exam Triage Vital Signs ED Triage Vitals  Enc Vitals Group     BP 02/04/22 1352 119/79     Pulse Rate 02/04/22 1352 (!) 47     Resp 02/04/22 1352 18     Temp 02/04/22 1352 98.1 F (36.7 C)     Temp src --      SpO2 02/04/22 1352 98 %     Weight --      Height --      Head Circumference --      Peak Flow --      Pain Score 02/04/22 1350 7     Pain Loc --      Pain Edu? --      Excl. in [place]? --    No data found.  Updated Vital Signs BP 119/79   Pulse (!) 47   Temp 98.1 F (36.7 C)   Resp 18   SpO2 98%   Visual Acuity Right Eye Distance:   Left Eye Distance:   Bilateral Distance:    Right Eye Near:   Left Eye Near:    Bilateral Near:     Physical Exam Vitals reviewed.  Constitutional:      General: He is awake.     Appearance: Normal appearance. He is well-developed. He is not ill-appearing.     Comments: Very pleasant male appears stated age in no acute distress sitting comfortably in exam room   HENT:     Head: Normocephalic and atraumatic.     Mouth/Throat:     Pharynx: Uvula midline. No oropharyngeal exudate or posterior oropharyngeal erythema.  Cardiovascular:     Rate and Rhythm: Normal rate and regular rhythm.     Heart sounds: Normal heart sounds, S1 normal and S2 normal. No murmur heard. Pulmonary:  Effort: Pulmonary effort is normal.     Breath sounds: Normal breath sounds. No stridor. No wheezing, rhonchi or rales.     Comments: Clear to auscultation bilaterally Chest:     Chest wall: No deformity, swelling or tenderness.  Abdominal:     General: Bowel sounds are normal.     Palpations: Abdomen is soft.     Tenderness: There is no abdominal tenderness. There is no right CVA tenderness, left CVA tenderness, guarding or rebound.  Neurological:     Mental Status: He is alert.  Psychiatric:        Behavior: Behavior is cooperative.      UC Treatments / Results  Labs (all labs ordered are listed, but only abnormal results are displayed) Labs Reviewed - No data to display  EKG   Radiology No results found.  Procedures Procedures (including critical care time)  Medications Ordered in UC Medications  alum & mag hydroxide-simeth (MAALOX/MYLANTA) 200-200-20 MG/5ML suspension 30 mL (30 mLs Oral Given 02/04/22 1445)    And  lidocaine (XYLOCAINE) 2 % viscous mouth solution 15 mL (15 mLs Oral Given 02/04/22 1445)    Initial Impression / Assessment and Plan / UC Course  I have reviewed the triage vital signs and the nursing notes.  Pertinent labs & imaging results that were available during my care of the patient were reviewed by me and considered in my medical decision making (see chart for details).     Patient is well-appearing, afebrile, nontoxic, nontachycardic.  EKG obtained showed sinus bradycardia with early repolarization without ischemic changes; compared to 12/20/2020 no significant change.  Patient was given GI cocktail with resolution of pain.   Reports that he is currently asymptomatic and feeling well.  Discussed that symptoms could have been related to acid reflux.  We will start Pepcid at night.  Encouraged him to follow-up with cardiology given intermittent nature of symptoms.  He was given contact information for local provider with instruction to call to schedule an appointment.  He does not have a primary care provider so we will try to establish him with someone via PCP assistance.  He was encouraged to avoid NSAIDs and alcohol.  Discussed that if he has any recurrent chest pain or additional symptoms including shortness of breath, palpitations, lightheadedness, weakness he needs to be seen immediately.  Strict return precautions given.  Work excuse note provided.  Final Clinical Impressions(s) / UC Diagnoses   Final diagnoses:  Gastroesophageal reflux disease, unspecified whether esophagitis present  Intermittent chest pain     Discharge Instructions      I am glad that you are feeling better.  I think that acid reflux is probably contributing to your symptoms.  Given your history it would be worthwhile to follow-up with a cardiologist.  Please call to schedule an appointment.  If you have any recurrent chest pain or worsening symptoms including shortness of breath, lightheadedness you need to be seen immediately.  Please avoid alcohol and NSAIDs (aspirin, ibuprofen/Advil, naproxen/Aleve).  Take Pepcid to manage your stomach acid.  Someone should contact you to help arrange a primary care appointment.  If you do have any recurrent or worsening symptoms please be seen immediately.     ED Prescriptions     Medication Sig Dispense Auth. Provider   famotidine (PEPCID) 40 MG tablet Take 1 tablet (40 mg total) by mouth at bedtime. 30 tablet Sharhonda Atwood, Derry Skill, PA-C      PDMP not reviewed this encounter.   Delmont Prosch,  Derry Skill, PA-C 02/04/22 1506

## 2022-02-04 NOTE — Discharge Instructions (Signed)
I am glad that you are feeling better.  I think that acid reflux is probably contributing to your symptoms.  Given your history it would be worthwhile to follow-up with a cardiologist.  Please call to schedule an appointment.  If you have any recurrent chest pain or worsening symptoms including shortness of breath, lightheadedness you need to be seen immediately.  Please avoid alcohol and NSAIDs (aspirin, ibuprofen/Advil, naproxen/Aleve).  Take Pepcid to manage your stomach acid.  Someone should contact you to help arrange a primary care appointment.  If you do have any recurrent or worsening symptoms please be seen immediately.

## 2022-02-04 NOTE — ED Triage Notes (Addendum)
Pt reports Cp this morning that and he did not go to work. Pt reports CP resolved but now he has lower Back pain.

## 2022-02-11 ENCOUNTER — Ambulatory Visit (INDEPENDENT_AMBULATORY_CARE_PROVIDER_SITE_OTHER): Payer: No Typology Code available for payment source | Admitting: Physician Assistant

## 2022-02-11 ENCOUNTER — Encounter: Payer: Self-pay | Admitting: Physician Assistant

## 2022-02-11 VITALS — BP 108/62 | HR 52 | Ht 68.0 in | Wt 163.6 lb

## 2022-02-11 DIAGNOSIS — R5383 Other fatigue: Secondary | ICD-10-CM

## 2022-02-11 DIAGNOSIS — R0789 Other chest pain: Secondary | ICD-10-CM

## 2022-02-11 DIAGNOSIS — R0683 Snoring: Secondary | ICD-10-CM | POA: Diagnosis not present

## 2022-02-11 DIAGNOSIS — R072 Precordial pain: Secondary | ICD-10-CM

## 2022-02-11 NOTE — Progress Notes (Unsigned)
Cardiology Office Note:    Date:  02/13/2022   ID:  Chris Fox, DOB 06/27/1971, MRN [id number]  PCP:  Patient, No Pcp Per   [facility] Prov[facility] Cardiologist:  Evalina Field, MD     Referring MD: Terrilee Croak, PA-C   Chief Complaint  Patient presents with   Chest Pain   Shortness of Breath    History of Present Illness:    Chris Fox is a 51 y.o. male with a hx of pericarditis in 2013, hypertension and GERD.  Patient was admitted in 2013 with chest pain, he was noted to have anterior ST segment elevation which was felt to be consistent with LVH and early repole.  Echocardiogram showed normal EF without evidence of pericardial effusion.  Cardiac catheterization at the time showed normal coronary arteries.  It was felt his symptom was likely related to pericarditis and he was treated with NSAID therapy.  He did not tolerate colchicine.  He was seen in 2019 for preoperative evaluation, echocardiogram and Myoview were recommended to further risk stratify.  Myoview was low risk with no evidence of ischemia, echocardiogram was not done as the patient did not wish to come back.  He was cleared for surgery.  He was seen again by Sande Rives on 12/20/2020 for preoperative evaluation prior to upcoming knee surgery.  Echocardiogram and Myoview were ordered.  Echocardiogram obtained on 01/22/2021 showed EF 60 to 65%, no regional wall motion abnormality, no significant valve issue.  Nuclear stress test obtained on 01/22/2021 was low risk, EF 53%, no ischemia or infarction.  Since August last year, patient has been to the urgent care in the ED multiple times.  He went to the urgent care on 02/27/2019, 05/14/2021, 08/31/2021, 09/20/2021 and 11/16/2021 for knee pain.  He has been seen in urgent care on 10/24/2021 and ED on 10/25/2021 for bright red blood per rectum.  He was found to have hemorrhoids.  It was also noted he takes Tylenol and ibuprofen frequently.  CT of abdomen obtained on  10/25/2021 showed no acute abdominal pelvic abnormality, normal appendix, punctate nonobstructive right upper pole renal stone, hepatic steatosis.  He has been seen in the ED again on 01/04/2022 for right lower quadrant abdominal pain.  CT of abdomen pelvis with contrast obtained on 01/04/2022 showed no acute finding to explain his right lower quadrant abdominal pain, nonobstructive right nephrolithiasis with no hydronephrosis.  He was most recently seen in the urgent care on 02/04/2022 with both chest pain and abdominal discomfort.  EKG at the time showed sinus bradycardia with early repole, no ischemic changes.  He was given a GI cocktail with resolution of pain.  He was prescribed famotidine.  Given the presence of intermittent chest pain, he was referred to cardiology service.  Patient presents today for evaluation of chest pain.  He says to some, has been going on for several months and not accompanied by dyspnea on exertion.  Symptom may last days at a time.  EKG is unchanged and still shows sinus rhythm with early repole.  He also describe daytime somnolence and the fatigue.  On physical exam, he does not have significant lower extremity edema or pulmonary issues.  I recommended a TSH to assess the for fatigue.  He also has history of loud snoring and apnea episodes documented by ex-girlfriend and a friend.  I recommended a sleep study as well.  As for his chest pain and dyspnea, I recommended D-dimer and a coronary  CT.  He previously was using volatiles after bottles of ibuprofen and a Tylenol for knee pain.  He likely will need knee surgery in the future.  He is cleared if coronary CT is normal.  However given his ibuprofen use, my top suspicion for persistent chest discomfort would be esophageal issue such as ulcers.  If cardiac work-up is negative, he may need to see a GI doctor.   Past Medical History:  Diagnosis Date   Abdominal pain    a. LLQ 11/2011   GERD (gastroesophageal reflux disease)    Gout     Headache(784.0)    Midsternal chest pain    a. Cardiac cath 12/10/11 with normal coronaries and LV systolic function;  10/1935 Echo EF 55-60%, no valvular abnormalities or pericardial effusion   Pericarditis    a. presumed - 11/2011   ST elevation    a. Anterior ST elevation ? early repolarization vs pericarditis    Past Surgical History:  Procedure Laterality Date   CARDIAC CATHETERIZATION  12/10/11   normal coronaries and LV systolic function;  Echo with normal LV function, EF 55-60%, no valvular abnormalities or pericardial effusion   I & D EXTREMITY Right 08/27/2016   Procedure: IRRIGATION AND DEBRIDEMENT RIGHT FOOT;  Surgeon: Edrick Kins, DPM;  Location: [facility];  Service: Podiatry;  Laterality: Right;   LEFT HEART CATHETERIZATION WITH CORONARY ANGIOGRAM N/A 12/10/2011   Procedure: LEFT HEART CATHETERIZATION WITH CORONARY ANGIOGRAM;  Surgeon: Larey Dresser, MD;  Location: [facility] CATH LAB;  Service: Cardiovascular;  Laterality: N/A;    Current Medications: Current Meds  Medication Sig   ibuprofen (ADVIL) 200 MG tablet Take 400 mg by mouth every 8 (eight) hours as needed for headache or mild pain.     Allergies:   Colchicine   Social History   Socioeconomic History   Marital status: Single    Spouse name: Not on file   Number of children: Not on file   Years of education: Not on file   Highest education level: Not on file  Occupational History   Not on file  Tobacco Use   Smoking status: Former    Packs/day: 1.50    Years: 21.00    Total pack years: 31.50    Types: Cigarettes    Quit date: 09/11/2002    Years since quitting: 19.4   Smokeless tobacco: Never  Vaping Use   Vaping Use: Never used  Substance and Sexual Activity   Alcohol use: No   Drug use: No    Frequency: 20.0 times per week    Types: Marijuana, Other-see comments    Comment: quit 1 month ago   Sexual activity: Never  Other Topics Concern   Not on file  Social History Narrative   Not on file    Social Determinants of Health   Financial Resource Strain: Not on file  Food Insecurity: Not on file  Transportation Needs: Not on file  Physical Activity: Not on file  Stress: Not on file  Social Connections: Not on file     Family History: The patient's family history includes Colon cancer in his maternal grandfather and mother; Diabetes in his maternal grandmother; Hypertension in his maternal grandmother and mother. There is no history of Rectal cancer, Stomach cancer, or Esophageal cancer.  ROS:   Please see the history of present illness.     All other systems reviewed and are negative.  EKGs/Labs/Other Studies Reviewed:    The following studies were reviewed today:  Echo 01/22/2021  1. Left ventricular ejection fraction, by estimation, is 60 to 65%. The  left ventricle has normal function. The left ventricle has no regional  wall motion abnormalities. Left ventricular diastolic parameters were  normal.   2. Right ventricular systolic function is normal. The right ventricular  size is normal. There is normal pulmonary artery systolic pressure.   3. The mitral valve is normal in structure. Trivial mitral valve  regurgitation. No evidence of mitral stenosis.   4. The aortic valve is normal in structure. Aortic valve regurgitation is  not visualized. No aortic stenosis is present.   Comparison(s): 12/10/11 EF 55-60%.   EKG:  EKG is ordered today.  The ekg ordered today demonstrates normal sinus rhythm, no significant ST-T wave changes.  Recent Labs: 01/04/2022: ALT 13; Hemoglobin 11.8; Platelets 191 02/11/2022: BUN 8; Creatinine, Ser 0.78; Potassium 3.7; Sodium 142; TSH 0.522  Recent Lipid Panel    Component Value Date/Time   CHOL 151 12/10/2011 1459   TRIG 106 12/10/2011 1459   HDL 35 (L) 12/10/2011 1459   CHOLHDL 4.3 12/10/2011 1459   VLDL 21 12/10/2011 1459   LDLCALC 95 12/10/2011 1459     Risk Assessment/Calculations:           Physical Exam:    VS:   BP 108/62   Pulse (!) 52   Ht '5\' 8"'$  (1.727 m)   Wt 163 lb 9.6 oz (74.2 kg)   SpO2 97%   BMI 24.88 kg/m        Wt Readings from Last 3 Encounters:  02/11/22 163 lb 9.6 oz (74.2 kg)  01/04/22 165 lb (74.8 kg)  12/20/21 166 lb 0.1 oz (75.3 kg)     GEN:  Well nourished, well developed in no acute distress HEENT: Normal NECK: No JVD; No carotid bruits LYMPHATICS: No lymphadenopathy CARDIAC: RRR, no murmurs, rubs, gallops RESPIRATORY:  Clear to auscultation without rales, wheezing or rhonchi  ABDOMEN: Soft, non-tender, non-distended MUSCULOSKELETAL:  No edema; No deformity  SKIN: Warm and dry NEUROLOGIC:  Alert and oriented x 3 PSYCHIATRIC:  Normal affect   ASSESSMENT:    1. Atypical chest pain   2. Precordial pain   3. Fatigue, unspecified type   4. Snoring    PLAN:    In order of problems listed above:  Atypical chest pain: Obtain basic metabolic panel and a coronary CT.  Symptom is associated with shortness of breath.  Obtain D-dimer.  I suspect the most likely cause of his chest discomfort is GI in nature.  He has been using quite a lot of ibuprofen and Tylenol.  If test is negative, will need to consider GI referral.  Fatigue: Obtain TSH.  Snoring: along with daytime somnolence, I suspect the patient has obstructive sleep apnea.           Medication Adjustments/Labs and Tests Ordered: Current medicines are reviewed at length with the patient today.  Concerns regarding medicines are outlined above.  Orders Placed This Encounter  Procedures   CT CORONARY MORPH W/CTA COR W/SCORE W/CA W/CM &/OR WO/CM   TSH   D-Dimer, Quantitative   Basic Metabolic Panel (BMET)   EKG 12-Lead   Split night study   No orders of the defined types were placed in this encounter.   Patient Instructions  Medication Instructions:  Your physician recommends that you continue on your current medications as directed. Please refer to the Current Medication list given to you today.   *If you need a refill  on your cardiac medications before your next appointment, please call your pharmacy*   Lab Work: TSH, D-DIMER, BMET If you have labs (blood work) drawn today and your tests are completely normal, you will receive your results only by: [place] (if you have MyChart) OR A paper copy in the mail If you have any lab test that is abnormal or we need to change your treatment, we will call you to review the results.   Testing/Procedures: Cardiac CT Angiography (CTA), is a special type of CT scan that uses a computer to produce multi-dimensional views of major blood vessels throughout the body. In CT angiography, a contrast material is injected through an IV to help visualize the blood vessels   Your physician has recommended that you have a sleep study. This test records several body functions during sleep, including: brain activity, eye movement, oxygen and carbon dioxide blood levels, heart rate and rhythm, breathing rate and rhythm, the flow of air through your mouth and nose, snoring, body muscle movements, and chest and belly movement.    Follow-Up: At [facility], you and your health needs are our priority.  As part of our continuing mission to provide you with exceptional heart care, we have created designated Provider Care Teams.  These Care Teams include your primary Cardiologist (physician) and Advanced Practice Providers (APPs -  Physician Assistants and Nurse Practitioners) who all work together to provide you with the care you need, when you need it.  We recommend signing up for the patient portal called "MyChart".  Sign up information is provided on this After Visit Summary.  MyChart is used to connect with patients for Virtual Visits (Telemedicine).  Patients are able to view lab/test results, encounter notes, upcoming appointments, etc.  Non-urgent messages can be sent to your provider as well.   To learn more about what you can do with MyChart, go to  [email or web address].    Your next appointment:   4 month(s)  The format for your next appointment:   In Person  Provider:   Evalina Field, MD     Other Instructions   Your cardiac CT will be scheduled at one of the below locations:   [facility] [address] [place], [place] [address] [phone]  [place] [address] [place], [place] [address] [phone]  If scheduled at [facility], please arrive at the [facility] and Children's Entrance (Entrance C2) of [facility] 30 minutes prior to test start time. You can use the FREE valet parking offered at entrance C (encouraged to control the heart rate for the test)  Proceed to the [facility] Radiology Department (first floor) to check-in and test prep.  All radiology patients and guests should use entrance C2 at [facility], accessed from [facility], even though the hospital's physical address listed is [address]    If scheduled at [facility], please arrive 15 mins early for check-in and test prep.  On the Night Before the Test: Be sure to Drink plenty of water. Do not consume any caffeinated/decaffeinated beverages or chocolate 12 hours prior to your test. Do not take any antihistamines 12 hours prior to your test.  On the Day of the Test: Drink plenty of water until 1 hour prior to the test. Do not eat any food 4 hours prior to the test. You may take your regular medications prior to the test.  FEMALES- please wear underwire-free bra if available, avoid dresses & tight clothing      After the Test: Drink plenty of water. After receiving IV contrast, you may experience a mild flushed feeling. This is normal. On occasion, you may experience a mild rash up to 24 hours after the test. This is not dangerous. If this occurs, you can take Benadryl 25 mg and increase your  fluid intake. If you experience trouble breathing, this can be serious. If it is severe call 911 IMMEDIATELY. If it is mild, please call our office. If you take any of these medications: Glipizide/Metformin, Avandament, Glucavance, please do not take 48 hours after completing test unless otherwise instructed.  We will call to schedule your test 2-4 weeks out understanding that some insurance companies will need an authorization prior to the service being performed.   For non-scheduling related questions, please contact the cardiac imaging nurse navigator should you have any questions/concerns: Marchia Bond, Cardiac Imaging Nurse Navigator [place] Clement, Cardiac Imaging Nurse Navigator [facility] Direct Office Dial: [phone]   For scheduling needs, including cancellations and rescheduling, please call [place], [phone].   Important Information About Sugar         Hilbert Corrigan, [place]  02/13/2022 2:22 PM    [place]

## 2022-02-11 NOTE — Patient Instructions (Signed)
Medication Instructions:  Your physician recommends that you continue on your current medications as directed. Please refer to the Current Medication list given to you today.  *If you need a refill on your cardiac medications before your next appointment, please call your pharmacy*   Lab Work: TSH, D-DIMER, BMET If you have labs (blood work) drawn today and your tests are completely normal, you will receive your results only by: [place] (if you have MyChart) OR A paper copy in the mail If you have any lab test that is abnormal or we need to change your treatment, we will call you to review the results.   Testing/Procedures: Cardiac CT Angiography (CTA), is a special type of CT scan that uses a computer to produce multi-dimensional views of major blood vessels throughout the body. In CT angiography, a contrast material is injected through an IV to help visualize the blood vessels   Your physician has recommended that you have a sleep study. This test records several body functions during sleep, including: brain activity, eye movement, oxygen and carbon dioxide blood levels, heart rate and rhythm, breathing rate and rhythm, the flow of air through your mouth and nose, snoring, body muscle movements, and chest and belly movement.    Follow-Up: At [facility], you and your health needs are our priority.  As part of our continuing mission to provide you with exceptional heart care, we have created designated Provider Care Teams.  These Care Teams include your primary Cardiologist (physician) and Advanced Practice Providers (APPs -  Physician Assistants and Nurse Practitioners) who all work together to provide you with the care you need, when you need it.  We recommend signing up for the patient portal called "MyChart".  Sign up information is provided on this After Visit Summary.  MyChart is used to connect with patients for Virtual Visits (Telemedicine).  Patients are able to view  lab/test results, encounter notes, upcoming appointments, etc.  Non-urgent messages can be sent to your provider as well.   To learn more about what you can do with MyChart, go to [email or web address].    Your next appointment:   4 month(s)  The format for your next appointment:   In Person  Provider:   Evalina Field, MD     Other Instructions   Your cardiac CT will be scheduled at one of the below locations:   [facility] [address] [place], [place] [address] [phone]  [place] [address] [place], [place] [address] [phone]  If scheduled at [facility], please arrive at the [facility] and Children's Entrance (Entrance C2) of [facility] 30 minutes prior to test start time. You can use the FREE valet parking offered at entrance C (encouraged to control the heart rate for the test)  Proceed to the [facility] Radiology Department (first floor) to check-in and test prep.  All radiology patients and guests should use entrance C2 at [facility], accessed from [facility], even though the hospital's physical address listed is [address]    If scheduled at [facility], please arrive 15 mins early for check-in and test prep.  On the Night Before the Test: Be sure to Drink plenty of water. Do not consume any caffeinated/decaffeinated beverages or chocolate 12 hours prior to your test. Do not take any antihistamines 12 hours prior to your test.  On the Day of the Test:  Drink plenty of water until 1 hour prior to the test. Do not eat any food 4 hours prior to the test. You may take your regular medications prior to the test.  FEMALES- please wear underwire-free bra if available, avoid dresses & tight clothing      After the Test: Drink plenty of water. After receiving IV contrast, you may experience a mild flushed  feeling. This is normal. On occasion, you may experience a mild rash up to 24 hours after the test. This is not dangerous. If this occurs, you can take Benadryl 25 mg and increase your fluid intake. If you experience trouble breathing, this can be serious. If it is severe call 911 IMMEDIATELY. If it is mild, please call our office. If you take any of these medications: Glipizide/Metformin, Avandament, Glucavance, please do not take 48 hours after completing test unless otherwise instructed.  We will call to schedule your test 2-4 weeks out understanding that some insurance companies will need an authorization prior to the service being performed.   For non-scheduling related questions, please contact the cardiac imaging nurse navigator should you have any questions/concerns: Marchia Bond, Cardiac Imaging Nurse Navigator Gordy Clement, Cardiac Imaging Nurse Navigator [facility] Direct Office Dial: [phone]   For scheduling needs, including cancellations and rescheduling, please call [place], [phone].   Important Information About Sugar

## 2022-02-12 LAB — BASIC METABOLIC PANEL
BUN/Creatinine Ratio: 10 (ref 9–20)
BUN: 8 mg/dL (ref 6–24)
CO2: 23 mmol/L (ref 20–29)
Calcium: 9 mg/dL (ref 8.7–10.2)
Chloride: 105 mmol/L (ref 96–106)
Creatinine, Ser: 0.78 mg/dL (ref 0.76–1.27)
Glucose: 76 mg/dL (ref 70–99)
Potassium: 3.7 mmol/L (ref 3.5–5.2)
Sodium: 142 mmol/L (ref 134–144)
eGFR: 107 mL/min/{1.73_m2} (ref >59)

## 2022-02-12 LAB — D-DIMER, QUANTITATIVE: D-DIMER: <0.2 mg/L FEU (ref 0.00–0.49)

## 2022-02-12 LAB — TSH: TSH: 0.522 u[IU]/mL (ref 0.450–4.500)

## 2022-02-13 ENCOUNTER — Encounter: Payer: Self-pay | Admitting: Physician Assistant

## 2022-02-13 ENCOUNTER — Telehealth: Payer: Self-pay

## 2022-02-13 NOTE — Telephone Encounter (Addendum)
Left voice message for patient. Will try calling again.   ----- Message from Almyra Deforest, [place] sent at 02/12/2022  4:08 PM EDT ----- Thyroid normal. D-dimer normal. Renal function and electrolyte normal. Proceed with coronary CT and sleep study as planned.

## 2022-02-19 ENCOUNTER — Telehealth (HOSPITAL_COMMUNITY): Payer: Self-pay | Admitting: *Deleted

## 2022-02-19 NOTE — Telephone Encounter (Signed)
Reaching out to patient to offer assistance regarding upcoming cardiac imaging study; pt verbalizes understanding of appt date/time, parking situation and where to check in, pre-test NPO status and verified current allergies; name and call back number provided for further questions should they arise  Chris Clement RN Navigator Cardiac [place] [facility] [phone] office [phone] cell  Patient aware to arrive at 7:30am.

## 2022-02-20 ENCOUNTER — Ambulatory Visit (HOSPITAL_COMMUNITY)
Admission: RE | Admit: 2022-02-20 | Discharge: 2022-02-20 | Disposition: A | Discharge status: Home / Self Care | Payer: No Typology Code available for payment source | Source: Ambulatory Visit | Attending: Physician Assistant | Admitting: Physician Assistant

## 2022-02-20 DIAGNOSIS — R072 Precordial pain: Secondary | ICD-10-CM | POA: Diagnosis not present

## 2022-02-20 MED ORDER — NITROGLYCERIN 0.4 MG SL SUBL
SUBLINGUAL_TABLET | SUBLINGUAL | Status: AC
  Filled 2022-02-20: qty 2

## 2022-02-20 MED ORDER — NITROGLYCERIN 0.4 MG SL SUBL
0.8000 mg | SUBLINGUAL_TABLET | Freq: Once | SUBLINGUAL | Status: AC
  Administered 2022-02-20: 0.8 mg via SUBLINGUAL

## 2022-02-20 MED ORDER — IOHEXOL 350 MG/ML SOLN
100.0000 mL | Freq: Once | INTRAVENOUS | Status: AC | PRN
  Administered 2022-02-20: 100 mL via INTRAVENOUS

## 2022-02-25 ENCOUNTER — Telehealth: Payer: Self-pay

## 2022-02-25 NOTE — Telephone Encounter (Addendum)
Left voice message for patient to give office a call back for recent CTA results. Will try calling again.  ----- Message from Almyra Deforest, [place] sent at 02/21/2022  1:44 PM EDT ----- Please inform the patient his coronary artery is clean. His chest pain is unlikely to come from the heart, likely GI in nature.

## 2022-03-01 ENCOUNTER — Telehealth: Payer: Self-pay | Admitting: *Deleted

## 2022-03-01 NOTE — Telephone Encounter (Signed)
Prior Authorization for split night sleep study sent to [facility] via Phone. Per Shellie R no PA is required.

## 2022-04-04 ENCOUNTER — Encounter: Payer: Self-pay | Admitting: Cardiovascular Disease

## 2022-04-12 ENCOUNTER — Encounter (HOSPITAL_BASED_OUTPATIENT_CLINIC_OR_DEPARTMENT_OTHER): Payer: No Typology Code available for payment source | Admitting: Cardiology

## 2022-05-08 ENCOUNTER — Ambulatory Visit (HOSPITAL_COMMUNITY)
Admission: EM | Admit: 2022-05-08 | Discharge: 2022-05-08 | Disposition: A | Discharge status: Home / Self Care | Payer: No Typology Code available for payment source | Attending: Family Medicine | Admitting: Family Medicine

## 2022-05-08 ENCOUNTER — Encounter (HOSPITAL_COMMUNITY): Payer: Self-pay | Admitting: Emergency Medicine

## 2022-05-08 DIAGNOSIS — J069 Acute upper respiratory infection, unspecified: Secondary | ICD-10-CM

## 2022-05-08 MED ORDER — PROMETHAZINE-DM 6.25-15 MG/5ML PO SYRP: 5.0000 mL | ORAL_SOLUTION | Freq: Four times a day (QID) | ORAL | 0 refills | Status: DC | PRN

## 2022-05-08 NOTE — ED Triage Notes (Signed)
Pt reports mild body aches, cough, nasal congestion and head congestion. States symptoms began yesterday while at work. Has tried taking aleve for relief.

## 2022-05-08 NOTE — ED Provider Notes (Signed)
[place]   [id number] 05/08/22 Arrival Time: 1104  ASSESSMENT & PLAN:  1. Viral URI with cough    Discussed typical duration of viral illnesses. Viral testing declined. OTC symptom care as needed.  Meds ordered this encounter  Medications   promethazine-dextromethorphan (PROMETHAZINE-DM) 6.25-15 MG/5ML syrup    Sig: Take 5 mLs by mouth 4 (four) times daily as needed for cough.    Dispense:  118 mL    Refill:  0   Work note provided.   Follow-up Information     [facility].   Specialty: Urgent Care Why: If worsening or failing to improve as anticipated. Contact information: [place] [address] [phone]                Reviewed expectations re: course of current medical issues. Questions answered. Outlined signs and symptoms indicating need for more acute intervention. Understanding verbalized. After Visit Summary given.   SUBJECTIVE: History from: Patient.  Chris Fox is a 51 y.o. male. Reports: mild body aches, cough, nasal congestion and head congestion; abrupt onset yesterday while at work. Has tried taking Aleve for relief.   Denies: fever. Normal PO intake without n/v/d.  OBJECTIVE:  Vitals:   05/08/22 1245  BP: 131/89  Pulse: (!) 56  Resp: 18  Temp: 98 F (36.7 C)  TempSrc: Oral  SpO2: 100%    General appearance: alert; no distress Eyes: PERRLA; EOMI; conjunctiva normal HENT: Green; AT; with nasal congestion Neck: supple  Lungs: speaks full sentences without difficulty; unlabored; slight cough  Extremities: no edema Skin: warm and dry Neurologic: normal gait Psychological: alert and cooperative; normal mood and affect  Allergies  Allergen Reactions   Colchicine Other (See Comments)    GI pain    Past Medical History:  Diagnosis Date   Abdominal pain    a. LLQ 11/2011   GERD (gastroesophageal reflux disease)    Gout    Headache(784.0)     Midsternal chest pain    a. Cardiac cath 12/10/11 with normal coronaries and LV systolic function;  10/1960 Echo EF 55-60%, no valvular abnormalities or pericardial effusion   Pericarditis    a. presumed - 11/2011   ST elevation    a. Anterior ST elevation ? early repolarization vs pericarditis   Social History   Socioeconomic History   Marital status: Single    Spouse name: Not on file   Number of children: Not on file   Years of education: Not on file   Highest education level: Not on file  Occupational History   Not on file  Tobacco Use   Smoking status: Former    Packs/day: 1.50    Years: 21.00    Total pack years: 31.50    Types: Cigarettes    Quit date: 09/11/2002    Years since quitting: 19.6   Smokeless tobacco: Never  Vaping Use   Vaping Use: Never used  Substance and Sexual Activity   Alcohol use: No   Drug use: No    Frequency: 20.0 times per week    Types: Marijuana, Other-see comments    Comment: quit 1 month ago   Sexual activity: Never  Other Topics Concern   Not on file  Social History Narrative   Not on file   Social Determinants of Health   Financial Resource Strain: Not on file  Food Insecurity: Not on file  Transportation Needs: Not on file  Physical Activity: Not  on file  Stress: Not on file  Social Connections: Not on file  Intimate Partner Violence: Not on file   Family History  Problem Relation Age of Onset   Diabetes Maternal Grandmother    Hypertension Maternal Grandmother    Colon cancer Mother    Hypertension Mother    Colon cancer Maternal Grandfather    Rectal cancer Neg Hx    Stomach cancer Neg Hx    Esophageal cancer Neg Hx    Past Surgical History:  Procedure Laterality Date   CARDIAC CATHETERIZATION  12/10/11   normal coronaries and LV systolic function;  Echo with normal LV function, EF 55-60%, no valvular abnormalities or pericardial effusion   I & D EXTREMITY Right 08/27/2016   Procedure: IRRIGATION AND DEBRIDEMENT RIGHT  FOOT;  Surgeon: Edrick Kins, DPM;  Location: [place];  Service: Podiatry;  Laterality: Right;   LEFT HEART CATHETERIZATION WITH CORONARY ANGIOGRAM N/A 12/10/2011   Procedure: LEFT HEART CATHETERIZATION WITH CORONARY ANGIOGRAM;  Surgeon: Larey Dresser, MD;  Location: [facility] CATH LAB;  Service: Cardiovascular;  Laterality: N/A;     Vanessa Kick, MD 05/08/22 1520

## 2022-05-08 NOTE — Discharge Instructions (Signed)
Follow up with your primary care doctor or here if you are not seeing improvement of your symptoms over the next several days, sooner if you feel you are worsening.  Caring for yourself: Get plenty of rest. Drink plenty of fluids, enough so that your urine is light yellow or clear like water. If you have kidney, heart, or liver disease and have to limit fluids, talk with your doctor before you increase the amount of fluids you drink. Take an over-the-counter pain medicine if needed, such as acetaminophen (Tylenol), ibuprofen (Advil, Motrin), or naproxen (Aleve), to relieve fever, headache, and muscle aches. Read and follow all instructions on the label. No one younger than 20 should take aspirin. It has been linked to Reye syndrome, a serious illness. Before you use over the counter cough and cold medicines, check the label. These medicines may not be safe for children younger than age 36 or for people with certain health problems. If the skin around your nose and lips becomes sore, put some petroleum jelly on the area.  Avoid spreading a virus: Wash your hands regularly, and keep your hands away from your face.  Stay home from school, work, and other public places until you are feeling better and your fever has been gone for at least 24 hours. The fever needs to have gone away on its own without the help of medicine.

## 2022-05-29 NOTE — Progress Notes (Signed)
Cardiology Clinic Note   Patient Name: Chris Fox Date of Encounter: 05/31/2022  Primary Care Provider:  Pcp, No Primary Cardiologist:  Evalina Field, MD  Patient Profile    51 year old male with history of hypertension, GERD and pericarditis diagnosed in 2013. Echocardiogram obtained on 01/22/2021 showed EF 60 to 65%, no regional wall motion abnormality, no significant valve issue.  Nuclear stress test obtained on 01/22/2021 was low risk, EF 53%, no ischemia or infarction. He seeks care for acute needs from urgent care. He has history of noncardiac chest pain, chronically. Cardiac CTA on 02/20/2022 revealed a calcium score of 0, no evidence of CAD. Due ot snoring, he was scheduled for a sleep study in 01/2022 but no results are available.   Past Medical History    Past Medical History:  Diagnosis Date   Abdominal pain    a. LLQ 11/2011   GERD (gastroesophageal reflux disease)    Gout    Headache(784.0)    Midsternal chest pain    a. Cardiac cath 12/10/11 with normal coronaries and LV systolic function;  01/1244 Echo EF 55-60%, no valvular abnormalities or pericardial effusion   Pericarditis    a. presumed - 11/2011   ST elevation    a. Anterior ST elevation ? early repolarization vs pericarditis   Past Surgical History:  Procedure Laterality Date   CARDIAC CATHETERIZATION  12/10/11   normal coronaries and LV systolic function;  Echo with normal LV function, EF 55-60%, no valvular abnormalities or pericardial effusion   I & D EXTREMITY Right 08/27/2016   Procedure: IRRIGATION AND DEBRIDEMENT RIGHT FOOT;  Surgeon: Edrick Kins, DPM;  Location: [place];  Service: Podiatry;  Laterality: Right;   LEFT HEART CATHETERIZATION WITH CORONARY ANGIOGRAM N/A 12/10/2011   Procedure: LEFT HEART CATHETERIZATION WITH CORONARY ANGIOGRAM;  Surgeon: Larey Dresser, MD;  Location: [facility] CATH LAB;  Service: Cardiovascular;  Laterality: N/A;    Allergies  Allergies  Allergen Reactions   Colchicine  Other (See Comments)    GI pain    History of Present Illness    He comes today with continued complaints of chest discomfort.  He states that it is there continuously, worsen with lying down, sitting up, no change in position helps.  He states that he has been taking a lot of NSAIDs and Tylenol for the pain.  He had been placed on colchicine in the past for presumed pericarditis and was unable to tolerate due to significant GI issues.  He has recently been in the ED for recurrent chest discomfort.  He was ruled out for MI.  As stated above he did have a coronary CTA with no coronary calcifications.  Chest discomfort was found to be noncardiac in etiology.  He has become very frustrated as the pain continues and there is been no definitive diagnosis for his chronic pain.  He does not have a primary care provider to do more thorough testing and referral.  Home Medications    Current Outpatient Medications  Medication Sig Dispense Refill   ibuprofen (ADVIL) 200 MG tablet Take 400 mg by mouth every 8 (eight) hours as needed for headache or mild pain.     promethazine-dextromethorphan (PROMETHAZINE-DM) 6.25-15 MG/5ML syrup Take 5 mLs by mouth 4 (four) times daily as needed for cough. (Patient not taking: Reported on 05/31/2022) 118 mL 0   No current facility-administered medications for this visit.     Family History    Family History  Problem Relation  Age of Onset   Diabetes Maternal Grandmother    Hypertension Maternal Grandmother    Colon cancer Mother    Hypertension Mother    Colon cancer Maternal Grandfather    Rectal cancer Neg Hx    Stomach cancer Neg Hx    Esophageal cancer Neg Hx    He indicated that his mother is alive. He indicated that his father is alive. He indicated that his sister is alive. He indicated that his maternal grandmother is alive. He indicated that the status of his maternal grandfather is unknown. He indicated that the status of his neg hx is unknown.  Social  History    Social History   Socioeconomic History   Marital status: Single    Spouse name: Not on file   Number of children: Not on file   Years of education: Not on file   Highest education level: Not on file  Occupational History   Not on file  Tobacco Use   Smoking status: Former    Packs/day: 1.50    Years: 21.00    Total pack years: 31.50    Types: Cigarettes    Quit date: 09/11/2002    Years since quitting: 19.7   Smokeless tobacco: Never  Vaping Use   Vaping Use: Never used  Substance and Sexual Activity   Alcohol use: No   Drug use: No    Frequency: 20.0 times per week    Types: Marijuana, Other-see comments    Comment: quit 1 month ago   Sexual activity: Never  Other Topics Concern   Not on file  Social History Narrative   Not on file   Social Determinants of Health   Financial Resource Strain: Not on file  Food Insecurity: Not on file  Transportation Needs: Not on file  Physical Activity: Not on file  Stress: Not on file  Social Connections: Not on file  Intimate Partner Violence: Not on file     Review of Systems    General:  No chills, fever, night sweats or weight changes.  Cardiovascular: Chronic chest pain, dyspnea on exertion, edema, orthopnea, palpitations, paroxysmal nocturnal dyspnea. Dermatological: No rash, lesions/masses Respiratory: No cough, dyspnea Urologic: No hematuria, dysuria Abdominal:   No nausea, vomiting, diarrhea, bright red blood per rectum, melena, or hematemesis Neurologic:  No visual changes, wkns, changes in mental status. All other systems reviewed and are otherwise negative except as noted above.     Physical Exam    VS:  BP 114/70 (BP Location: Left Arm, Patient Position: Sitting)   Pulse (!) 59   Ht '5\' 8"'$  (1.727 m)   Wt 158 lb 3.2 oz (71.8 kg)   SpO2 96%   BMI 24.05 kg/m  , BMI Body mass index is 24.05 kg/m.     GEN: Well nourished, well developed, in no acute distress. HEENT: normal. Neck: Supple, no  JVD, carotid bruits, or masses. Cardiac: RRR, no murmurs, rubs, or gallops. No clubbing, cyanosis, edema.  Radials/DP/PT 2+ and equal bilaterally.  Respiratory:  Respirations regular and unlabored, clear to auscultation bilaterally. GI: Soft, nontender, nondistended, BS + x 4. MS: no deformity or atrophy. Skin: warm and dry, no rash. Neuro:  Strength and sensation are intact. Psych: Normal affect.  Accessory Clinical Findings    ECG personally reviewed by me today-not completed this office visit  Lab Results  Component Value Date   WBC 5.0 01/04/2022   HGB 11.8 (L) 01/04/2022   HCT 36.7 (L) 01/04/2022   MCV  96.1 01/04/2022   PLT 191 01/04/2022   Lab Results  Component Value Date   CREATININE 0.78 02/11/2022   BUN 8 02/11/2022   NA 142 02/11/2022   K 3.7 02/11/2022   CL 105 02/11/2022   CO2 23 02/11/2022   Lab Results  Component Value Date   ALT 13 01/04/2022   AST 23 01/04/2022   ALKPHOS 66 01/04/2022   BILITOT 0.7 01/04/2022   Lab Results  Component Value Date   CHOL 151 12/10/2011   HDL 35 (L) 12/10/2011   LDLCALC 95 12/10/2011   TRIG 106 12/10/2011   CHOLHDL 4.3 12/10/2011    Lab Results  Component Value Date   HGBA1C 5.5 12/10/2011    Review of Prior Studies: Coronary CTA 02/20/2022 IMPRESSION: 1. Coronary calcium score of 0. This was 0 percentile for age and sex matched control.   2. Normal coronary origin with right dominance.   3. No evidence of CAD. CAD-RADS 0. No evidence of CAD (0%). Consider non-atherosclerotic causes of chest pain.  Echocardiogram 01/22/2021 1. Left ventricular ejection fraction, by estimation, is 60 to 65%. The  left ventricle has normal function. The left ventricle has no regional  wall motion abnormalities. Left ventricular diastolic parameters were  normal.   2. Right ventricular systolic function is normal. The right ventricular  size is normal. There is normal pulmonary artery systolic pressure.   3. The mitral valve  is normal in structure. Trivial mitral valve  regurgitation. No evidence of mitral stenosis.   4. The aortic valve is normal in structure. Aortic valve regurgitation is  not visualized. No aortic stenosis is present.     Assessment & Plan   1.  Noncardiac chest discomfort: Unknown etiology at this time.  Had coronary CTA which was negative for coronary artery stenosis.  Has had frequent trips to ED for similar pain.  I have given him reassurance that his pain is unlikely to be cardiac in etiology.  This may be more musculoskeletal.  X-rays of his cervical and lumbar spine are ordered and he is to follow-up with a primary care provider for further investigation.  No pain medications were prescribed as cardiology will not be doing any pain management in this patient.  PCP list was provided to the patient so that he could have a person to follow-up and do further testing as necessary.  This will be reinforced when we call him the results of his x-rays.  2.  GERD: Has been seen by GI and has not had follow-up.  Will need to have PCP for recommendations and treatment.    Current medicines are reviewed at length with the patient today.  I have spent 25 min's  dedicated to the care of this patient on the date of this encounter to include pre-visit review of records, assessment, management and diagnostic testing,with shared decision making. Signed, Phill Myron. West Pugh, ANP, AACC   05/31/2022 11:36 AM      Office [phone] Fax [phone]  Notice: This dictation was prepared with Dragon dictation along with smaller phrase technology. Any transcriptional errors that result from this process are unintentional and may not be corrected upon review.

## 2022-05-31 ENCOUNTER — Ambulatory Visit: Payer: No Typology Code available for payment source | Attending: Cardiovascular Disease | Admitting: Adult Health

## 2022-05-31 ENCOUNTER — Encounter: Payer: Self-pay | Admitting: Adult Health

## 2022-05-31 ENCOUNTER — Ambulatory Visit: Payer: No Typology Code available for payment source | Admitting: Cardiovascular Disease

## 2022-05-31 VITALS — BP 114/70 | HR 59 | Ht 68.0 in | Wt 158.2 lb

## 2022-05-31 DIAGNOSIS — R0789 Other chest pain: Secondary | ICD-10-CM | POA: Diagnosis not present

## 2022-05-31 NOTE — Patient Instructions (Signed)
Medication Instructions:  No Changes *If you need a refill on your cardiac medications before your next appointment, please call your pharmacy*   Lab Work: No Labs If you have labs (blood work) drawn today and your tests are completely normal, you will receive your results only by: [place] (if you have MyChart) OR A paper copy in the mail If you have any lab test that is abnormal or we need to change your treatment, we will call you to review the results.   Testing/Procedures: [facility], [address]. Your provider has requested a DG Cervical Spine and DG Thoracic Spine X-Ray.    Follow-Up: At [facility] you and your health needs are our priority.  As part of our continuing mission to provide you with exceptional heart care, we have created designated Provider Care Teams.  These Care Teams include your primary Cardiologist (physician) and Advanced Practice Providers (APPs -  Physician Assistants and Nurse Practitioners) who all work together to provide you with the care you need, when you need it.  We recommend signing up for the patient portal called "MyChart".  Sign up information is provided on this After Visit Summary.  MyChart is used to connect with patients for Virtual Visits (Telemedicine).  Patients are able to view lab/test results, encounter notes, upcoming appointments, etc.  Non-urgent messages can be sent to your provider as well.   To learn more about what you can do with MyChart, go to [email or web address].    Your next appointment:   Follow Up As Needed   The format for your next appointment:   In Person  Provider:   Evalina Field, MD

## 2022-06-13 ENCOUNTER — Ambulatory Visit
Admission: RE | Admit: 2022-06-13 | Discharge: 2022-06-13 | Disposition: A | Discharge status: Home / Self Care | Payer: No Typology Code available for payment source | Source: Ambulatory Visit | Attending: Adult Health | Admitting: Adult Health

## 2022-06-13 ENCOUNTER — Other Ambulatory Visit: Payer: Self-pay | Admitting: Adult Health

## 2022-06-13 DIAGNOSIS — R0789 Other chest pain: Secondary | ICD-10-CM

## 2022-06-18 ENCOUNTER — Telehealth: Payer: Self-pay

## 2022-06-18 NOTE — Telephone Encounter (Addendum)
Called patient regarding results. Left message for patient to call office.----- Message from Lendon Colonel, NP sent at 06/16/2022  6:44 PM EST ----- I have reviewed his x-rays. He has a bone spurs in his neck and some disc degeneration in his back. He should follow up with his PCP concerning his recurrent pain. MRI might be necessary, but will defer to PCP to follow. No further cardiac testing at this time.  Jacklynn Ganong

## 2022-07-05 ENCOUNTER — Telehealth: Payer: Self-pay

## 2022-07-05 NOTE — Telephone Encounter (Addendum)
Called patient  regarding results. Left message. Letter mailed out 12/8----- Message from Lendon Colonel, NP sent at 06/16/2022  6:44 PM EST ----- I have reviewed his x-rays. He has a bone spurs in his neck and some disc degeneration in his back. He should follow up with his PCP concerning his recurrent pain. MRI might be necessary, but will defer to PCP to follow. No further cardiac testing at this time.  Jacklynn Ganong

## 2022-08-05 ENCOUNTER — Encounter (HOSPITAL_COMMUNITY): Payer: Self-pay

## 2022-08-05 ENCOUNTER — Ambulatory Visit (HOSPITAL_COMMUNITY)
Admission: EM | Admit: 2022-08-05 | Discharge: 2022-08-05 | Disposition: A | Discharge status: Home / Self Care | Payer: No Typology Code available for payment source | Attending: Family Medicine | Admitting: Family Medicine

## 2022-08-05 DIAGNOSIS — M25562 Pain in left knee: Secondary | ICD-10-CM

## 2022-08-05 DIAGNOSIS — G8929 Other chronic pain: Secondary | ICD-10-CM | POA: Diagnosis not present

## 2022-08-05 DIAGNOSIS — M25561 Pain in right knee: Secondary | ICD-10-CM

## 2022-08-05 NOTE — Discharge Instructions (Signed)
You were seen today for chronic bilateral knee pain.  I have given you several days off of work to rest.  Please continue tylenol or motrin for pain.  Follow up your orthopedist if possible.

## 2022-08-05 NOTE — ED Triage Notes (Signed)
Pt c/o chronic bilateral knee pain for over 2 years. States pain was pretty bad today and wasn't able to go to work. Denies new injury. States taking ibuprofen and tylenol with no relief.

## 2022-08-05 NOTE — ED Provider Notes (Signed)
[place]    CSN: [id number] Arrival date & time: 08/05/22  1039      History   Chief Complaint Chief Complaint  Patient presents with   Knee Pain    HPI Chris Fox is a 52 y.o. male.   Patient is here for bilateral knee pain. He does have chronic known knee pain due to arthritis.  Today the pain was much worse and unable to go to work.  Pain in the knee, up and the down the legs as well.  He did not do anything out of the ordinary yesterday.  He does have an orthopedist already.  He has taken tylenol/motrin for pain.        Past Medical History:  Diagnosis Date   Abdominal pain    a. LLQ 11/2011   GERD (gastroesophageal reflux disease)    Gout    Headache(784.0)    Midsternal chest pain    a. Cardiac cath 12/10/11 with normal coronaries and LV systolic function;  08/353 Echo EF 55-60%, no valvular abnormalities or pericardial effusion   Pericarditis    a. presumed - 11/2011   ST elevation    a. Anterior ST elevation ? early repolarization vs pericarditis    Patient Active Problem List   Diagnosis Date Noted   Macrocytic anemia 06/20/2020   Family history of colon cancer 06/20/2020   Rectal bleeding 06/20/2020   Foot abscess 08/25/2016   Gastroesophageal reflux disease 08/25/2016   Gout 08/25/2016   Midsternal chest pain    ST elevation    Abdominal pain    Chest pain 12/10/2011   Bradycardia 12/10/2011    Past Surgical History:  Procedure Laterality Date   CARDIAC CATHETERIZATION  12/10/11   normal coronaries and LV systolic function;  Echo with normal LV function, EF 55-60%, no valvular abnormalities or pericardial effusion   I & D EXTREMITY Right 08/27/2016   Procedure: IRRIGATION AND DEBRIDEMENT RIGHT FOOT;  Surgeon: Chris Fox, DPM;  Location: [place];  Service: Podiatry;  Laterality: Right;   LEFT Fox CATHETERIZATION WITH CORONARY ANGIOGRAM N/A 12/10/2011   Procedure: LEFT Fox CATHETERIZATION WITH CORONARY ANGIOGRAM;  Surgeon:  Chris Dresser, MD;  Location: [facility] CATH LAB;  Service: Cardiovascular;  Laterality: N/A;       Home Medications    Prior to Admission medications   Medication Sig Start Date End Date Taking? Authorizing Provider  ibuprofen (ADVIL) 200 MG tablet Take 400 mg by mouth every 8 (eight) hours as needed for headache or mild pain.    [provider]    Family History Family History  Problem Relation Age of Onset   Diabetes Maternal Grandmother    Hypertension Maternal Grandmother    Colon cancer Mother    Hypertension Mother    Colon cancer Maternal Grandfather    Rectal cancer Neg Hx    Stomach cancer Neg Hx    Esophageal cancer Neg Hx     Social History Social History   Tobacco Use   Smoking status: Former    Packs/day: 1.50    Years: 21.00    Total pack years: 31.50    Types: Cigarettes    Quit date: 09/11/2002    Years since quitting: 19.9   Smokeless tobacco: Never  Vaping Use   Vaping Use: Never used  Substance Use Topics   Alcohol use: No   Drug use: No    Frequency: 20.0 times per week    Types: Marijuana, Other-see comments  Comment: quit 1 month ago     Allergies   Colchicine   Review of Systems Review of Systems  Constitutional: Negative.   HENT: Negative.    Respiratory: Negative.    Cardiovascular: Negative.   Gastrointestinal: Negative.   Genitourinary: Negative.   Musculoskeletal:  Positive for arthralgias and joint swelling.  Psychiatric/Behavioral: Negative.       Physical Exam Triage Vital Signs ED Triage Vitals [08/05/22 1334]  Enc Vitals Group     BP 119/81     Pulse Rate (!) 58     Resp 18     Temp 97.8 F (36.6 C)     Temp Source Oral     SpO2 96 %     Weight      Height      Head Circumference      Peak Flow      Pain Score 7     Pain Loc      Pain Edu?      Excl. in [place]?    No data found.  Updated Vital Signs BP 119/81 (BP Location: Left Arm)   Pulse (!) 58   Temp 97.8 F (36.6 C) (Oral)   Resp 18    SpO2 96%   Visual Acuity Right Eye Distance:   Left Eye Distance:   Bilateral Distance:    Right Eye Near:   Left Eye Near:    Bilateral Near:     Physical Exam Constitutional:      Appearance: Normal appearance.  Musculoskeletal:     Comments: Knees are slightly swollen bilaterally;  no erythema noted.  No TTP noted.  Pain with flexion of the knees bilaterally.   Neurological:     General: No focal deficit present.     Mental Status: He is alert.  Psychiatric:        Mood and Affect: Mood normal.      UC Treatments / Results  Labs (all labs ordered are listed, but only abnormal results are displayed) Labs Reviewed - No data to display  EKG   Radiology No results found.  Procedures Procedures (including critical care time)  Medications Ordered in UC Medications - No data to display  Initial Impression / Assessment and Plan / UC Course  I have reviewed the triage vital signs and the nursing notes.  Pertinent labs & imaging results that were available during my care of the patient were reviewed by me and considered in my medical decision making (see chart for details).    Final Clinical Impressions(s) / UC Diagnoses   Final diagnoses:  Chronic pain of both knees     Discharge Instructions      You were seen today for chronic bilateral knee pain.  I have given you several days off of work to rest.  Please continue tylenol or motrin for pain.  Follow up your orthopedist if possible.     ED Prescriptions   None    PDMP not reviewed this encounter.   Rondel Oh, MD 08/05/22 1346

## 2022-10-15 ENCOUNTER — Encounter (HOSPITAL_COMMUNITY): Payer: Self-pay

## 2022-10-15 ENCOUNTER — Ambulatory Visit (HOSPITAL_COMMUNITY)
Admission: EM | Admit: 2022-10-15 | Discharge: 2022-10-15 | Disposition: A | Discharge status: Home / Self Care | Payer: No Typology Code available for payment source | Attending: Family Medicine | Admitting: Family Medicine

## 2022-10-15 DIAGNOSIS — M25562 Pain in left knee: Secondary | ICD-10-CM | POA: Insufficient documentation

## 2022-10-15 DIAGNOSIS — R109 Unspecified abdominal pain: Secondary | ICD-10-CM | POA: Diagnosis not present

## 2022-10-15 DIAGNOSIS — R072 Precordial pain: Secondary | ICD-10-CM | POA: Insufficient documentation

## 2022-10-15 DIAGNOSIS — Z1152 Encounter for screening for COVID-19: Secondary | ICD-10-CM | POA: Diagnosis not present

## 2022-10-15 DIAGNOSIS — Z87898 Personal history of other specified conditions: Secondary | ICD-10-CM | POA: Diagnosis not present

## 2022-10-15 DIAGNOSIS — J3489 Other specified disorders of nose and nasal sinuses: Secondary | ICD-10-CM | POA: Insufficient documentation

## 2022-10-15 DIAGNOSIS — R0789 Other chest pain: Secondary | ICD-10-CM | POA: Diagnosis not present

## 2022-10-15 DIAGNOSIS — M109 Gout, unspecified: Secondary | ICD-10-CM | POA: Diagnosis not present

## 2022-10-15 DIAGNOSIS — K219 Gastro-esophageal reflux disease without esophagitis: Secondary | ICD-10-CM | POA: Insufficient documentation

## 2022-10-15 DIAGNOSIS — G8929 Other chronic pain: Secondary | ICD-10-CM

## 2022-10-15 DIAGNOSIS — J069 Acute upper respiratory infection, unspecified: Secondary | ICD-10-CM

## 2022-10-15 MED ORDER — LIDOCAINE VISCOUS HCL 2 % MT SOLN
15.0000 mL | Freq: Once | OROMUCOSAL | Status: AC
  Administered 2022-10-15: 15 mL via OROMUCOSAL

## 2022-10-15 MED ORDER — KETOROLAC TROMETHAMINE 30 MG/ML IJ SOLN
30.0000 mg | Freq: Once | INTRAMUSCULAR | Status: AC
  Administered 2022-10-15: 30 mg via INTRAMUSCULAR

## 2022-10-15 MED ORDER — IBUPROFEN 800 MG PO TABS: 800.0000 mg | ORAL_TABLET | Freq: Three times a day (TID) | ORAL | 0 refills | Status: DC | PRN

## 2022-10-15 MED ORDER — ALUM & MAG HYDROXIDE-SIMETH 200-200-20 MG/5ML PO SUSP
30.0000 mL | Freq: Once | ORAL | Status: AC
  Administered 2022-10-15: 30 mL via ORAL

## 2022-10-15 MED ORDER — ALUM & MAG HYDROXIDE-SIMETH 200-200-20 MG/5ML PO SUSP
ORAL | Status: AC
  Filled 2022-10-15: qty 30

## 2022-10-15 MED ORDER — LIDOCAINE VISCOUS HCL 2 % MT SOLN
OROMUCOSAL | Status: AC
  Filled 2022-10-15: qty 15

## 2022-10-15 MED ORDER — KETOROLAC TROMETHAMINE 30 MG/ML IJ SOLN
INTRAMUSCULAR | Status: AC
  Filled 2022-10-15: qty 1

## 2022-10-15 NOTE — Discharge Instructions (Addendum)
You have been given a shot of Toradol 30 mg today.  Take ibuprofen 800 mg--1 tab every 8 hours as needed for pain.   You have been swabbed for COVID, and the test will result in the next 24 hours. Our staff will call you if positive. If the COVID test is positive, you should quarantine until you are fever free for 24 hours and you are starting to feel better, and then take added precautions for the next 5 days, such as physical distancing/wearing a mask and good hand hygiene/washing.  You can use the QR code/website at the back of the summary paperwork to schedule yourself a new patient appointment with primary care

## 2022-10-15 NOTE — ED Triage Notes (Signed)
Pt states mid chest pain for the past 2 days that radiates to his back and the back of his neck.  States he has this same pain on and off for many years,that he's been to the doctor and no one knows what is causing the pain. Took alleve yesterday with no relief.

## 2022-10-15 NOTE — ED Provider Notes (Signed)
[facility]    CSN: [id number] Arrival date & time: 10/15/22  1133      History   Chief Complaint Chief Complaint  Patient presents with   Chest Pain    HPI Chris Fox is a 52 y.o. male.    Chest Pain  Here for worsening of his chest pain.  About 2 days ago he began having some rhinorrhea and nasal congestion.  He has not had much cough and has not had any sore throat.  No fever.  When the congestion started he also started with worse chest pain.  It hurts in his anterior central chest.  It is worse with sitting a long time or lying down.  No increased burping and no nausea or vomiting or diarrhea.  It is not pleuritic.  He has had some chest pain that is bothered him for about a year or 2.  He did end up being seen by cardiology and his coronary calcium score in July 23 was 0.  Overall the chest pain had improved though it has been at a low level continuously, until he started having the respiratory symptoms 2 days ago.  Past Medical History:  Diagnosis Date   Abdominal pain    a. LLQ 11/2011   GERD (gastroesophageal reflux disease)    Gout    Headache(784.0)    Midsternal chest pain    a. Cardiac cath 12/10/11 with normal coronaries and LV systolic function;  [id number] Echo EF 55-60%, no valvular abnormalities or pericardial effusion   Pericarditis    a. presumed - 11/2011   ST elevation    a. Anterior ST elevation ? early repolarization vs pericarditis    Patient Active Problem List   Diagnosis Date Noted   Macrocytic anemia 06/20/2020   Family history of colon cancer 06/20/2020   Rectal bleeding 06/20/2020   Foot abscess 08/25/2016   Gastroesophageal reflux disease 08/25/2016   Gout 08/25/2016   Midsternal chest pain    ST elevation    Abdominal pain    Chest pain 12/10/2011   Bradycardia 12/10/2011    Past Surgical History:  Procedure Laterality Date   CARDIAC CATHETERIZATION  12/10/11   normal coronaries and LV systolic function;  Echo with  normal LV function, EF 55-60%, no valvular abnormalities or pericardial effusion   I & D EXTREMITY Right 08/27/2016   Procedure: IRRIGATION AND DEBRIDEMENT RIGHT FOOT;  Surgeon: Edrick Kins, DPM;  Location: [facility];  Service: Podiatry;  Laterality: Right;   LEFT HEART CATHETERIZATION WITH CORONARY ANGIOGRAM N/A 12/10/2011   Procedure: LEFT HEART CATHETERIZATION WITH CORONARY ANGIOGRAM;  Surgeon: Larey Dresser, MD;  Location: [facility] CATH LAB;  Service: Cardiovascular;  Laterality: N/A;       Home Medications    Prior to Admission medications   Medication Sig Start Date End Date Taking? Authorizing Provider  ibuprofen (ADVIL) 800 MG tablet Take 1 tablet (800 mg total) by mouth every 8 (eight) hours as needed (pain). 10/15/22  Yes Barrett Henle, MD    Family History Family History  Problem Relation Age of Onset   Diabetes Maternal Grandmother    Hypertension Maternal Grandmother    Colon cancer Mother    Hypertension Mother    Colon cancer Maternal Grandfather    Rectal cancer Neg Hx    Stomach cancer Neg Hx    Esophageal cancer Neg Hx     Social History Social History   Tobacco Use   Smoking status: Former  Packs/day: 1.50    Years: 21.00    Additional pack years: 0.00    Total pack years: 31.50    Types: Cigarettes    Quit date: 09/11/2002    Years since quitting: 20.1   Smokeless tobacco: Never  Vaping Use   Vaping Use: Never used  Substance Use Topics   Alcohol use: No   Drug use: No    Frequency: 20.0 times per week    Types: Marijuana, Other-see comments    Comment: quit 1 month ago     Allergies   Colchicine   Review of Systems Review of Systems  Cardiovascular:  Positive for chest pain.     Physical Exam Triage Vital Signs ED Triage Vitals [10/15/22 1205]  Enc Vitals Group     BP 122/73     Pulse Rate 70     Resp 16     Temp 98.2 F (36.8 C)     Temp Source Oral     SpO2 94 %     Weight      Height      Head Circumference      Peak  Flow      Pain Score 4     Pain Loc      Pain Edu?      Excl. in [place]?    No data found.  Updated Vital Signs BP 122/73 (BP Location: Right Arm)   Pulse 70   Temp 98.2 F (36.8 C) (Oral)   Resp 16   SpO2 94%   Visual Acuity Right Eye Distance:   Left Eye Distance:   Bilateral Distance:    Right Eye Near:   Left Eye Near:    Bilateral Near:     Physical Exam Vitals reviewed.  Constitutional:      General: He is not in acute distress.    Appearance: He is not toxic-appearing.  HENT:     Right Ear: Tympanic membrane and ear canal normal.     Left Ear: Tympanic membrane and ear canal normal.     Nose: Nose normal.     Mouth/Throat:     Mouth: Mucous membranes are moist.     Pharynx: No oropharyngeal exudate or posterior oropharyngeal erythema.  Eyes:     Extraocular Movements: Extraocular movements intact.     Conjunctiva/sclera: Conjunctivae normal.     Pupils: Pupils are equal, round, and reactive to light.  Cardiovascular:     Rate and Rhythm: Normal rate and regular rhythm.     Heart sounds: No murmur heard. Pulmonary:     Effort: Pulmonary effort is normal. No respiratory distress.     Breath sounds: Normal breath sounds. No stridor. No wheezing, rhonchi or rales.  Chest:     Chest wall: No tenderness.  Abdominal:     Palpations: Abdomen is soft.     Tenderness: There is no abdominal tenderness.  Musculoskeletal:     Cervical back: Neck supple.  Lymphadenopathy:     Cervical: No cervical adenopathy.  Skin:    Capillary Refill: Capillary refill takes less than 2 seconds.     Coloration: Skin is not jaundiced or pale.  Neurological:     General: No focal deficit present.     Mental Status: He is alert and oriented to person, place, and time.  Psychiatric:        Behavior: Behavior normal.      UC Treatments / Results  Labs (all labs ordered are listed, but only  abnormal results are displayed) Labs Reviewed  SARS CORONAVIRUS 2 (TAT 6-24 HRS)     EKG   Radiology No results found.  Procedures Procedures (including critical care time)  Medications Ordered in UC Medications  ketorolac (TORADOL) 30 MG/ML injection 30 mg (has no administration in time range)  alum & mag hydroxide-simeth (MAALOX/MYLANTA) 200-200-20 MG/5ML suspension 30 mL (30 mLs Oral Given 10/15/22 1237)  lidocaine (XYLOCAINE) 2 % viscous mouth solution 15 mL (15 mLs Mouth/Throat Given 10/15/22 1237)    Initial Impression / Assessment and Plan / UC Course  I have reviewed the triage vital signs and the nursing notes.  Pertinent labs & imaging results that were available during my care of the patient were reviewed by me and considered in my medical decision making (see chart for details).        EKG is unchanged from prior EKGs, and continues to have some early repolarization on V2 through V5.  No ST segment elevations or changes otherwise.  Since he has had relief sometimes in the past from a GI cocktail, GI cocktail is given today to help sort out how we can help him best  He will did not relieve this chest pain at all.  He has been taking Aleve for the left knee pain and it has not been helping.  800 mg ibuprofens are sent in for both pains and he is given a shot of Toradol here today.  He is swabbed for COVID today and if positive he will know if he needs to quarantine. Final Clinical Impressions(s) / UC Diagnoses   Final diagnoses:  Atypical chest pain  Viral URI  Chronic pain of left knee     Discharge Instructions      You have been given a shot of Toradol 30 mg today.  Take ibuprofen 800 mg--1 tab every 8 hours as needed for pain.   You have been swabbed for COVID, and the test will result in the next 24 hours. Our staff will call you if positive. If the COVID test is positive, you should quarantine until you are fever free for 24 hours and you are starting to feel better, and then take added precautions for the next 5 days, such as  physical distancing/wearing a mask and good hand hygiene/washing.  You can use the QR code/website at the back of the summary paperwork to schedule yourself a new patient appointment with primary care        ED Prescriptions     Medication Sig Dispense Auth. Provider   ibuprofen (ADVIL) 800 MG tablet Take 1 tablet (800 mg total) by mouth every 8 (eight) hours as needed (pain). 21 tablet Jameire Kouba, Gwenlyn Perking, MD      PDMP not reviewed this encounter.   Barrett Henle, MD 10/15/22 1257

## 2022-10-16 LAB — SARS CORONAVIRUS 2 (TAT 6-24 HRS): SARS Coronavirus 2: NEGATIVE

## 2023-06-18 IMAGING — CT CT ABD-PELV W/ CM
2 of 5 series · 16 of 46 positions shown, 18 images · IV contrast (Omni 300)
Comparison: CT 01/27/2012

CLINICAL DATA: RLQ abdominal pain (Age >= 14y)

EXAM:
CT ABDOMEN AND PELVIS WITH CONTRAST
TECHNIQUE: Multidetector CT imaging of the abdomen and pelvis was performed
using the standard protocol following bolus administration of
intravenous contrast.

[Series 3: a/p w/ 5mm · axial · 0.74mm/px · z∈[-194,+186]mm · 13 of 86 slices shown, 15 images]
[im 5/86  soft-tissue]
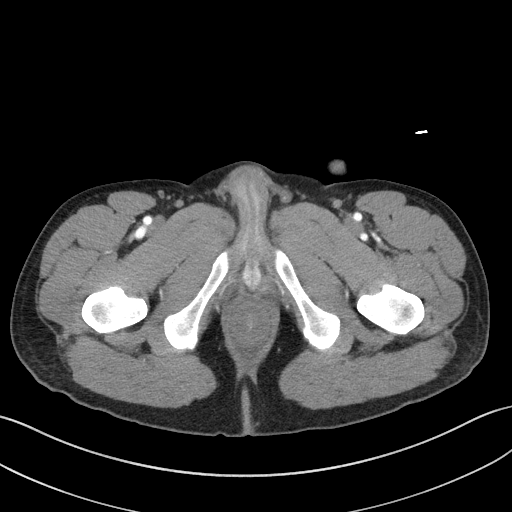
[im 5/86  bone]
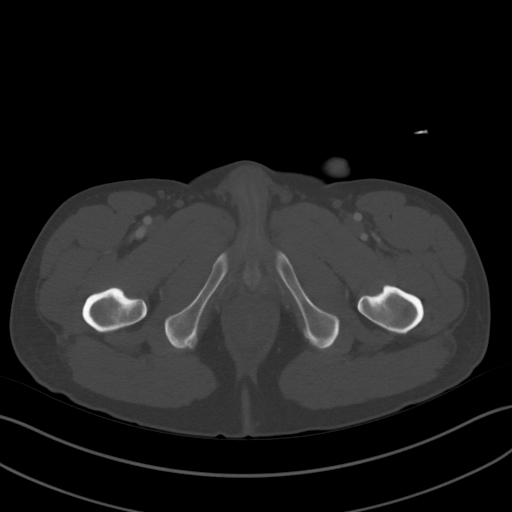
[im 10/86  soft-tissue]
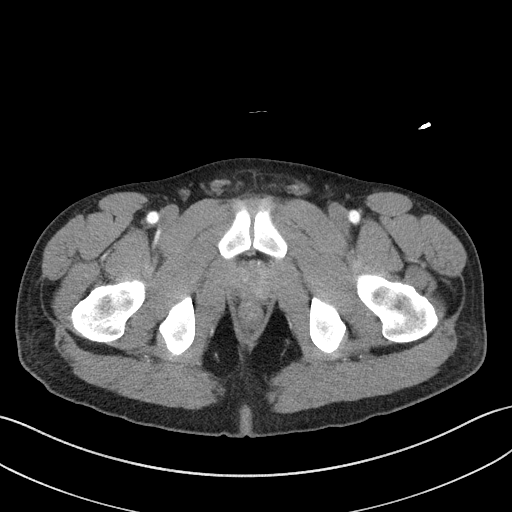
[im 19/86  soft-tissue]
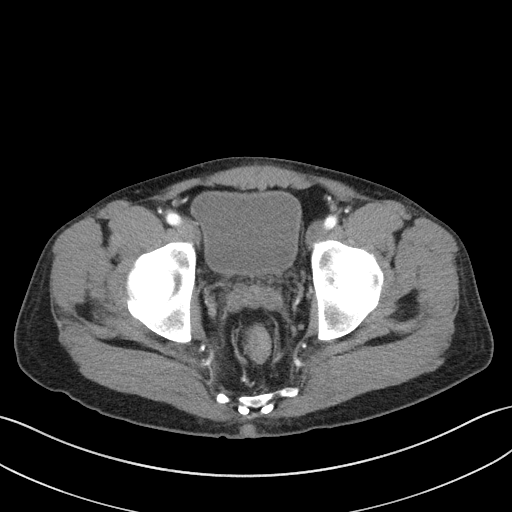
[im 24/86  soft-tissue]
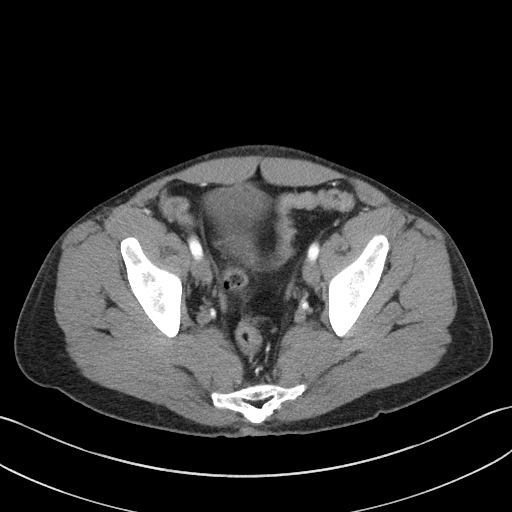
[im 29/86  soft-tissue]
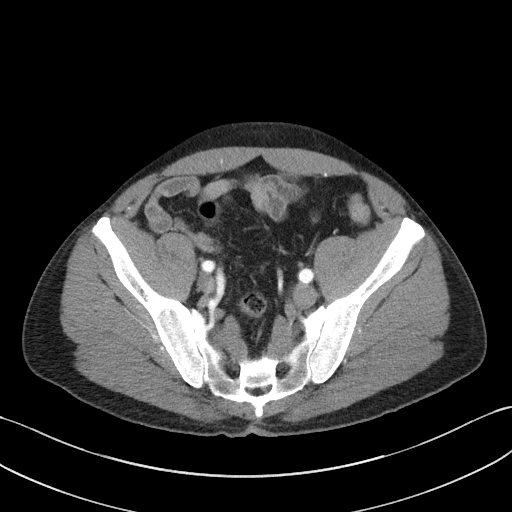
[im 38/86  soft-tissue]
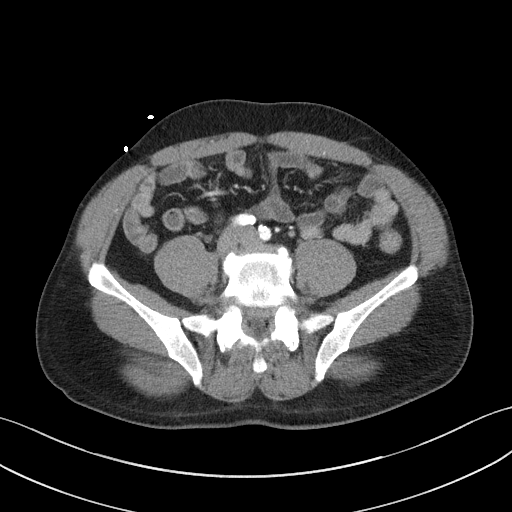
[im 43/86  soft-tissue]
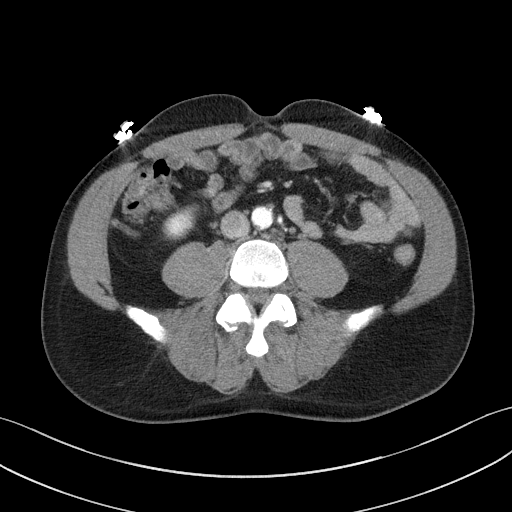
[im 48/86  soft-tissue]
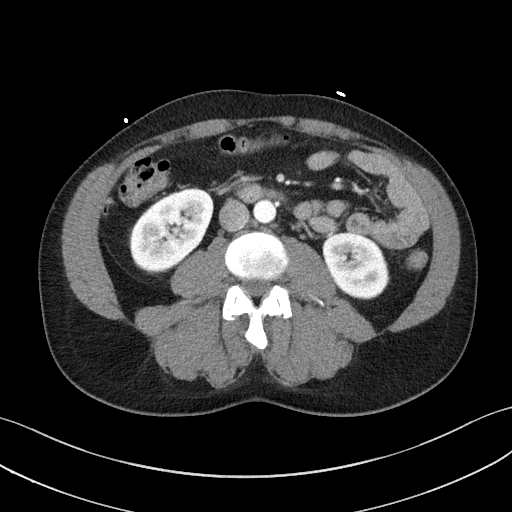
[im 57/86  soft-tissue]
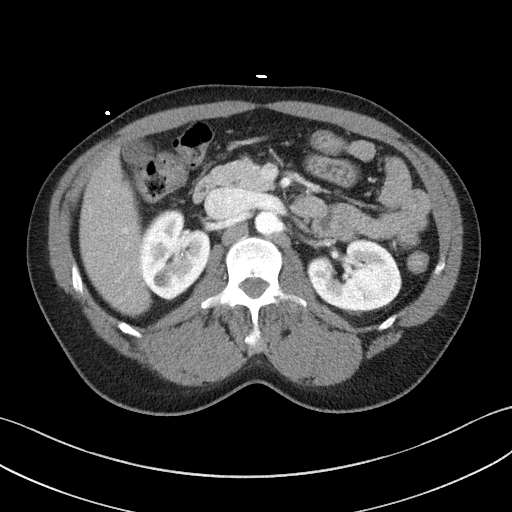
[im 57/86  bone]
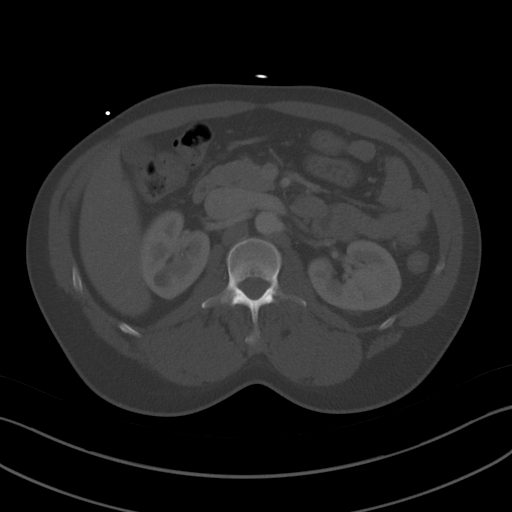
[im 62/86  soft-tissue]
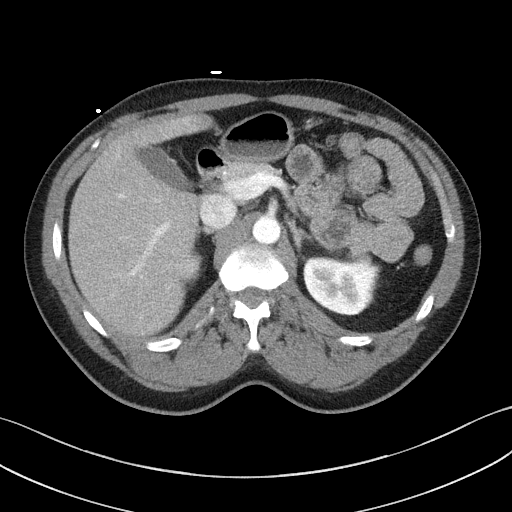
[im 67/86  soft-tissue]
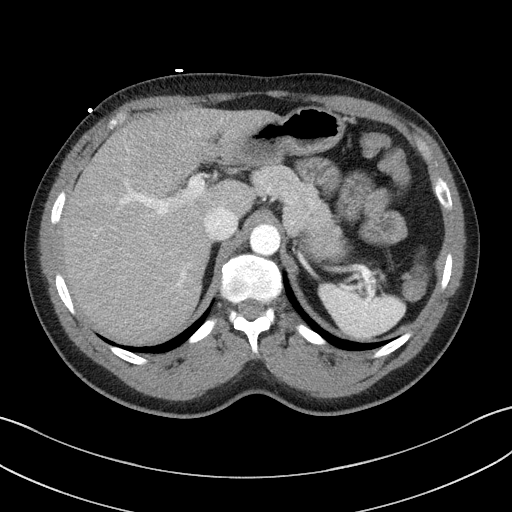
[im 76/86  soft-tissue]
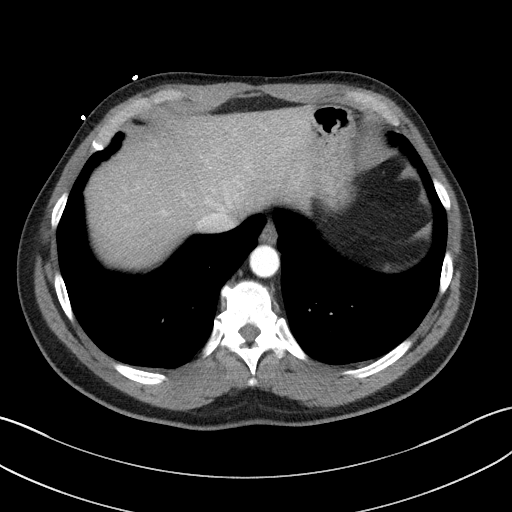
[im 81/86  soft-tissue]
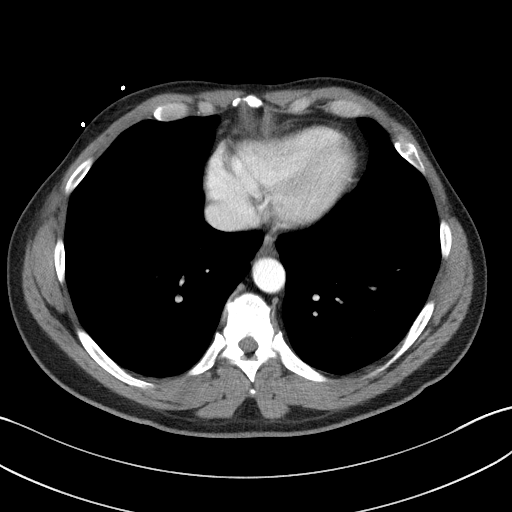

[Series 6: a/p w/ cor · coronal · 0.74mm/px · 3 of 128 slices shown]
[im 43/128  soft-tissue]
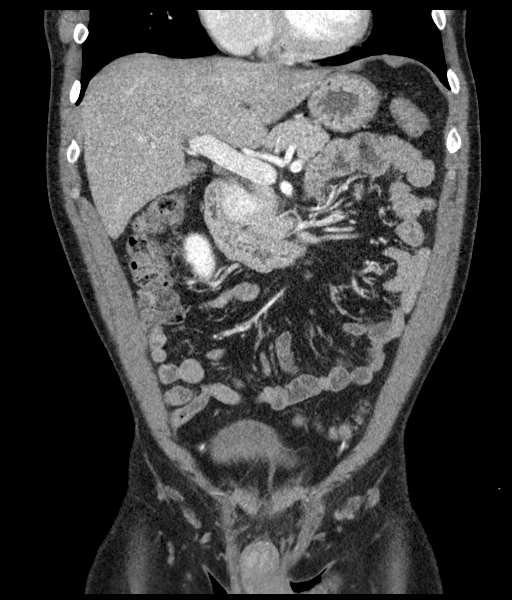
[im 57/128  soft-tissue]
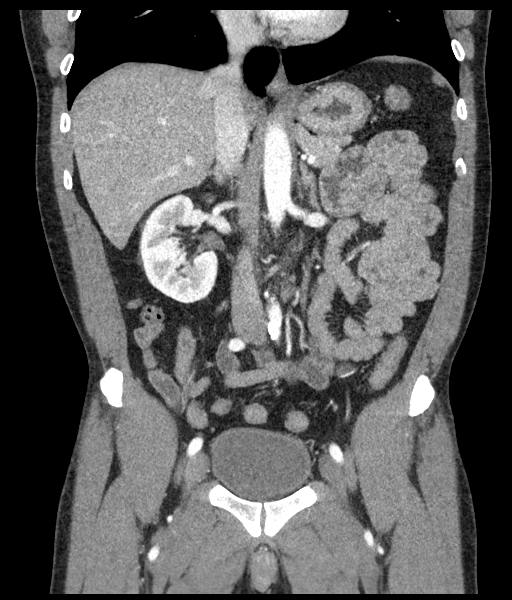
[im 71/128  soft-tissue]
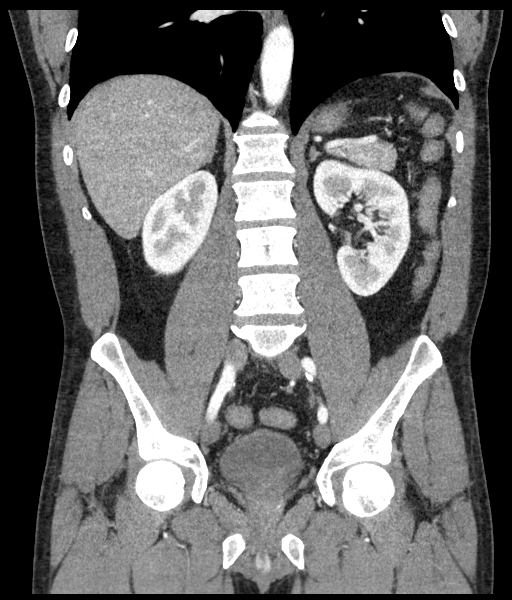

[16 of 46 positions shown; findings below may reference images not displayed]

RADIATION DOSE REDUCTION: This exam was performed according to the
departmental dose-optimization program which includes automated
exposure control, adjustment of the mA and/or kV according to
patient size and/or use of iterative reconstruction technique.

CONTRAST:  100mL OMNIPAQUE IOHEXOL 300 MG/ML  SOLN
FINDINGS: Lower chest: No acute abnormality.

Hepatobiliary: Hepatic steatosis. Small hepatic cyst adjacent to the
gallbladder. The gallbladder is unremarkable.

Pancreas: Unremarkable. No pancreatic ductal dilatation or
surrounding inflammatory changes.

Spleen: Normal in size without focal abnormality.

Adrenals/Urinary Tract: Adrenal glands are unremarkable. No
hydronephrosis. There is a punctate nonobstructive right upper pole
renal stone. Bladder is unremarkable.

Stomach/Bowel: The stomach is within normal limits. There is no
evidence of bowel obstruction.The appendix is normal. Scattered
colonic diverticula. No diverticulitis.

Vascular/Lymphatic: Aortoiliac atherosclerotic calcifications. No
AAA. No lymphadenopathy.

Reproductive: Unremarkable.

Other: No abdominal wall hernia or abnormality. No abdominopelvic
ascites.

Musculoskeletal: No acute or significant osseous findings.
IMPRESSION: No acute abdominopelvic abnormality.  Normal appendix.

Punctate nonobstructive right upper pole renal stone.

Hepatic steatosis.

## 2023-08-28 IMAGING — CT CT ABD-PELV W/ CM
2 of 5 series · 16 of 46 positions shown, 18 images · IV contrast (agent unspecified)
Comparison: 10/25/2021

CLINICAL DATA: Right lower quadrant abdominal pain, discomfort with
urination

EXAM:
CT ABDOMEN AND PELVIS WITH CONTRAST
TECHNIQUE: Multidetector CT imaging of the abdomen and pelvis was performed
using the standard protocol following bolus administration of
intravenous contrast.

[Series 3: abd/ pelvis 5.0 i30f 2 · axial · 0.71mm/px · z∈[+741,+1161]mm · 13 of 94 slices shown, 15 images]
[im 5/94  soft-tissue]
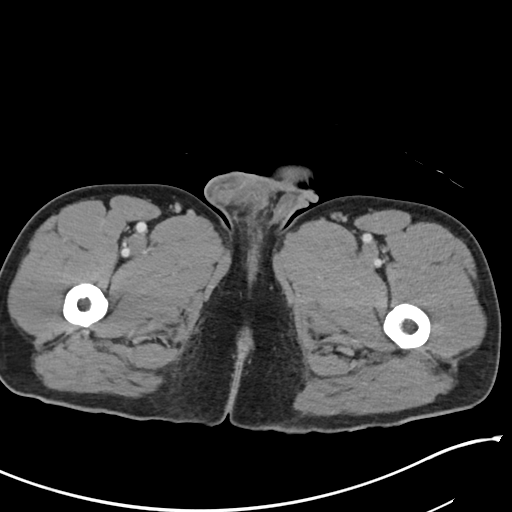
[im 5/94  bone]
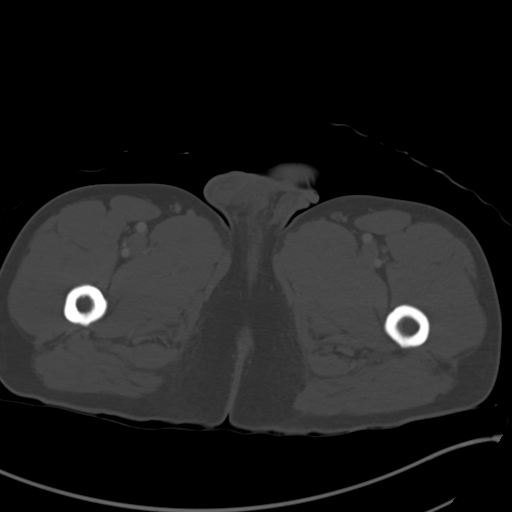
[im 15/94  soft-tissue]
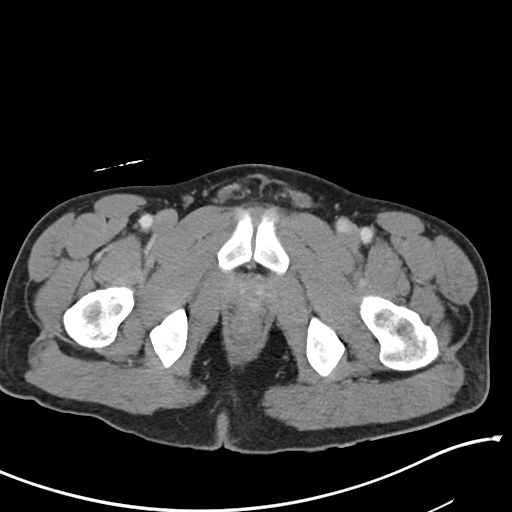
[im 20/94  soft-tissue]
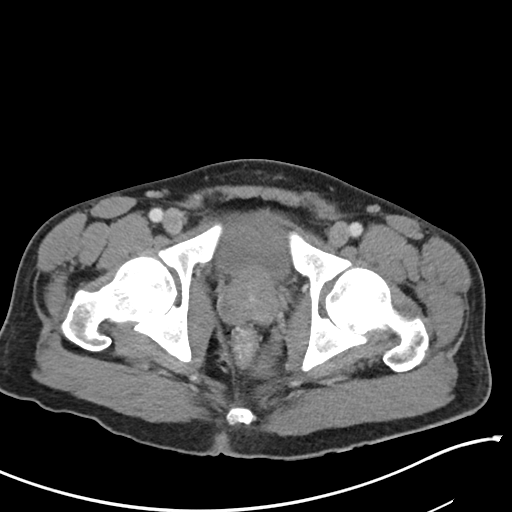
[im 25/94  soft-tissue]
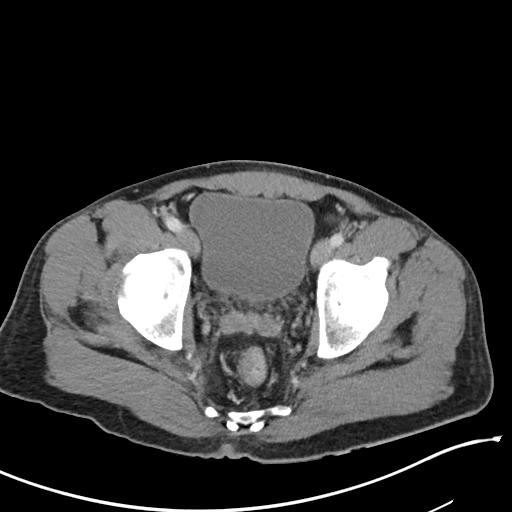
[im 35/94  soft-tissue]
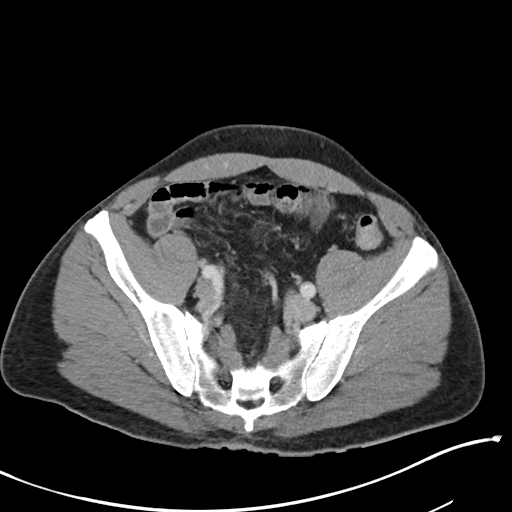
[im 40/94  soft-tissue]
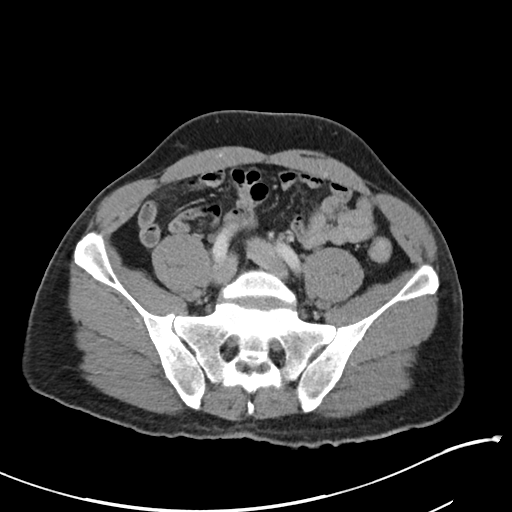
[im 49/94  soft-tissue]
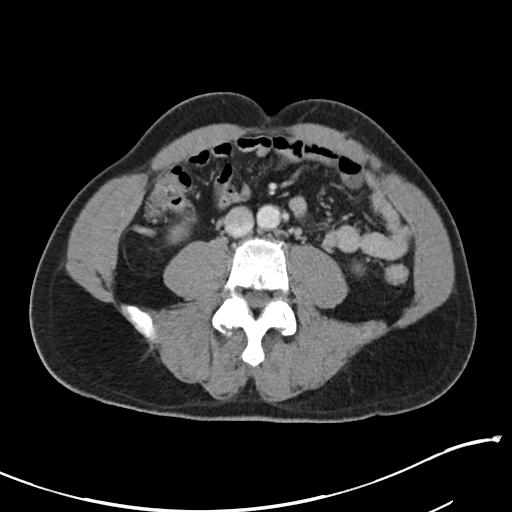
[im 54/94  soft-tissue]
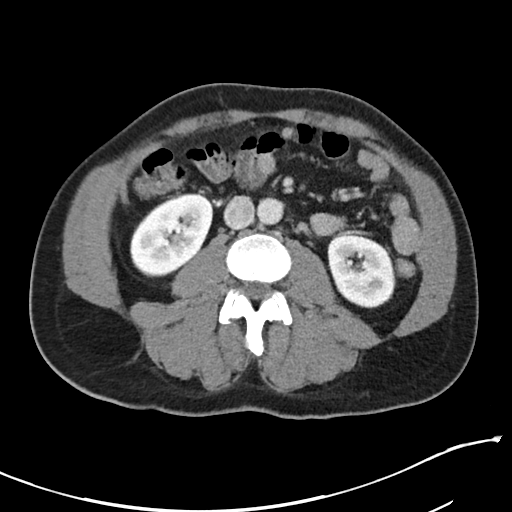
[im 59/94  soft-tissue]
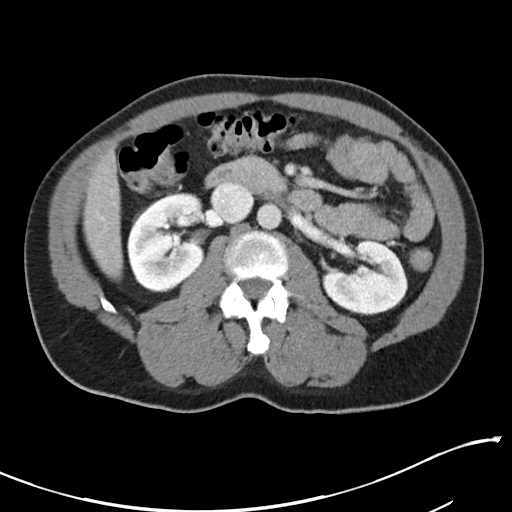
[im 59/94  bone]
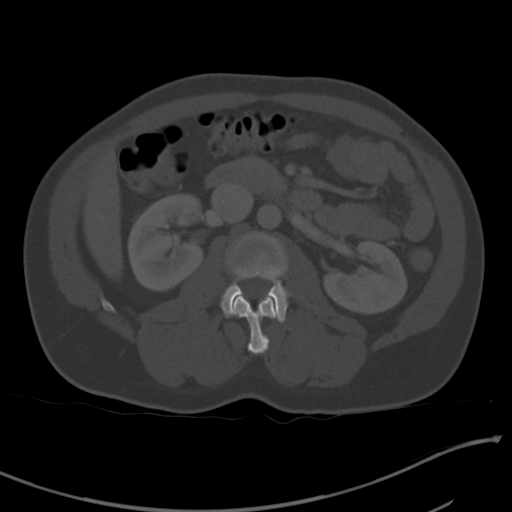
[im 69/94  soft-tissue]
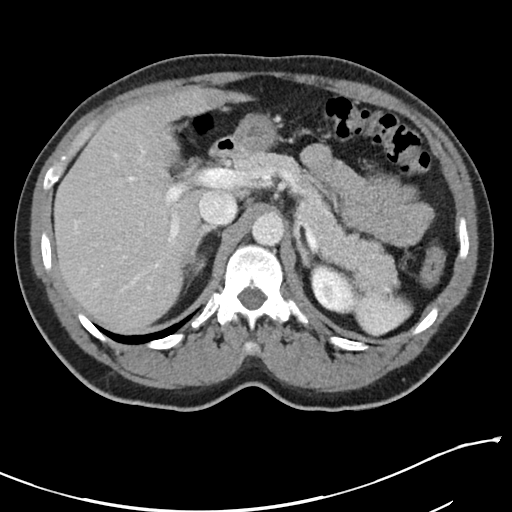
[im 74/94  soft-tissue]
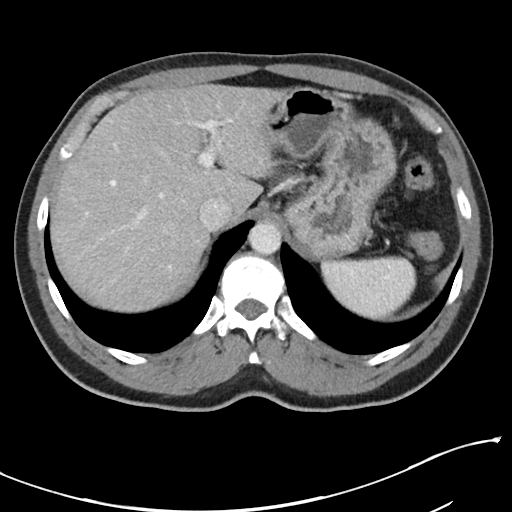
[im 79/94  soft-tissue]
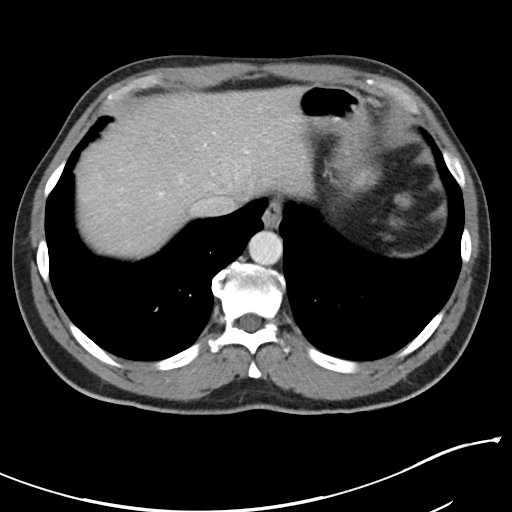
[im 89/94  soft-tissue]
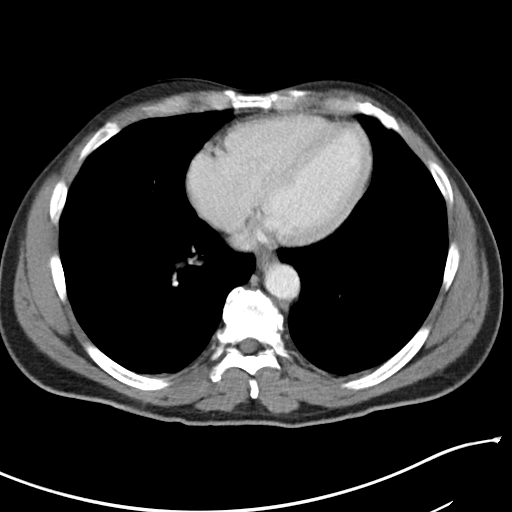

[Series 6: coronal soft tissue · coronal · 0.79mm/px · 3 of 101 slices shown]
[im 34/101  soft-tissue]
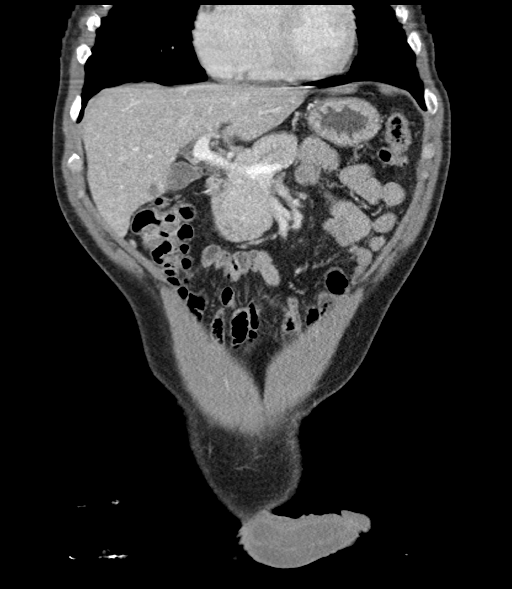
[im 45/101  soft-tissue]
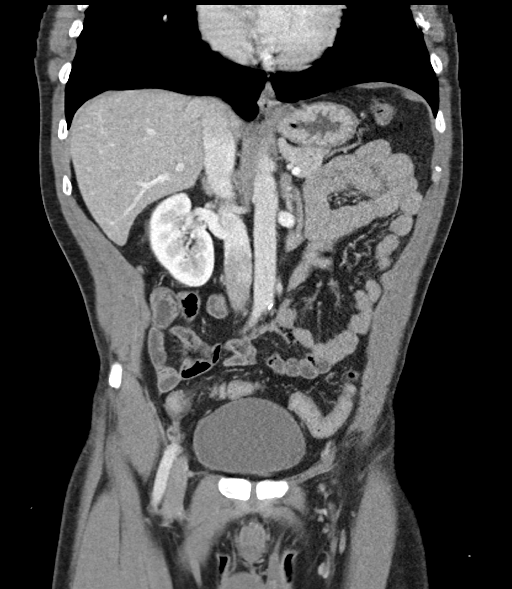
[im 56/101  soft-tissue]
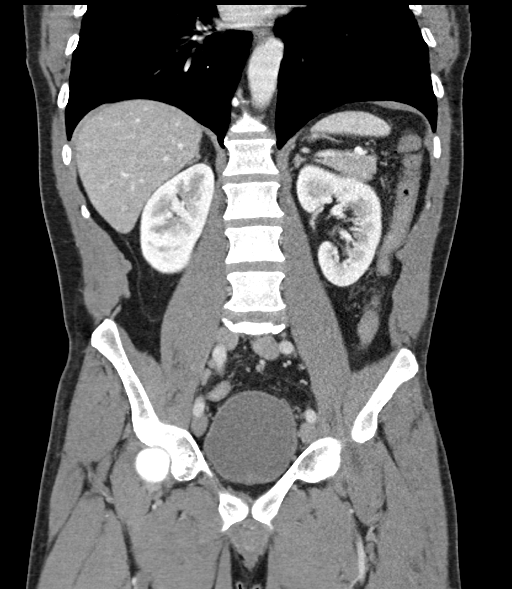

[16 of 46 positions shown; findings below may reference images not displayed]

RADIATION DOSE REDUCTION: This exam was performed according to the
departmental dose-optimization program which includes automated
exposure control, adjustment of the mA and/or kV according to
patient size and/or use of iterative reconstruction technique.

CONTRAST:  100mL OMNIPAQUE IOHEXOL 350 MG/ML SOLN
FINDINGS: Lower chest: No acute abnormality.

Hepatobiliary: No solid liver abnormality is seen. No gallstones,
gallbladder wall thickening, or biliary dilatation.

Pancreas: Unremarkable. No pancreatic ductal dilatation or
surrounding inflammatory changes.

Spleen: Normal in size without significant abnormality.

Adrenals/Urinary Tract: Adrenal glands are unremarkable. Punctuate
nonobstructive calculus of the superior pole of the right kidney
(series 6, image 53). No left-sided calculi, ureteral calculi, or
hydronephrosis. Bladder is unremarkable.

Stomach/Bowel: Stomach is within normal limits. Appendix appears
normal. No evidence of bowel wall thickening, distention, or
inflammatory changes.

Vascular/Lymphatic: Aortic atherosclerosis. No enlarged abdominal or
pelvic lymph nodes.

Reproductive: No mass or other significant abnormality.

Other: No abdominal wall hernia or abnormality. No ascites.

Musculoskeletal: No acute or significant osseous findings.
IMPRESSION: 1. No acute CT findings of the abdomen or pelvis to explain right
lower quadrant pain. Normal appendix.

2.  Nonobstructive right nephrolithiasis.  No hydronephrosis.

Aortic Atherosclerosis ([id number]).

## 2023-10-01 ENCOUNTER — Encounter (HOSPITAL_COMMUNITY): Payer: Self-pay

## 2023-10-01 ENCOUNTER — Ambulatory Visit (HOSPITAL_COMMUNITY)
Admission: EM | Admit: 2023-10-01 | Discharge: 2023-10-01 | Disposition: A | Discharge status: Home / Self Care | Attending: Physician Assistant | Admitting: Physician Assistant

## 2023-10-01 DIAGNOSIS — M79671 Pain in right foot: Secondary | ICD-10-CM | POA: Diagnosis not present

## 2023-10-01 DIAGNOSIS — R202 Paresthesia of skin: Secondary | ICD-10-CM

## 2023-10-01 DIAGNOSIS — R200 Anesthesia of skin: Secondary | ICD-10-CM | POA: Diagnosis not present

## 2023-10-01 DIAGNOSIS — B351 Tinea unguium: Secondary | ICD-10-CM

## 2023-10-01 DIAGNOSIS — M79672 Pain in left foot: Secondary | ICD-10-CM

## 2023-10-01 LAB — POCT URINALYSIS DIP (MANUAL ENTRY)
Bilirubin, UA: NEGATIVE
Blood, UA: NEGATIVE
Glucose, UA: NEGATIVE mg/dL
Ketones, POC UA: NEGATIVE mg/dL
Leukocytes, UA: NEGATIVE
Nitrite, UA: NEGATIVE
Spec Grav, UA: 1.025 (ref 1.010–1.025)
Urobilinogen, UA: 0.2 U/dL
pH, UA: 6 (ref 5.0–8.0)

## 2023-10-01 LAB — POCT FASTING CBG KUC MANUAL ENTRY: POCT Glucose (KUC): 83 mg/dL (ref 70–99)

## 2023-10-01 MED ORDER — GABAPENTIN 300 MG PO CAPS: 300.0000 mg | ORAL_CAPSULE | Freq: Every day | ORAL | 0 refills | Status: AC

## 2023-10-01 MED ORDER — CICLOPIROX OLAMINE 0.77 % EX SUSP: 1.0000 "application " | Freq: Every day | CUTANEOUS | 0 refills | Status: AC

## 2023-10-01 NOTE — Discharge Instructions (Signed)
 We do not have any evidence of diabetes which is great news.  Take gabapentin at night.  This will make you sleepy so do not drive drink alcohol while taking it.  Follow-up with primary care for additional workup of your symptoms.  Someone should call you to schedule an appointment.  I would also like you to follow-up with podiatry; call to schedule an appointment.  Keep your feet elevated and use compression to help with your symptoms.  Apply Loprox nightly to your right great toe.  Remove this with rubbing alcohol every 7 days.  If anything worsens or changes please return for reevaluation.

## 2023-10-01 NOTE — ED Triage Notes (Signed)
 Here for bilateral foot pain x 6 months. Denies any trauma or injuries.

## 2023-10-01 NOTE — ED Provider Notes (Signed)
 [facility]    CSN: [id number] Arrival date & time: 10/01/23  1650      History   Chief Complaint Chief Complaint  Patient presents with   Foot Pain    HPI Chris Fox is a 53 y.o. male.   Patient presents today with a several month history of bilateral foot pain.  He describes this as a burning/numb sensation in both feet.  It is worse on the right foot.  He has also noticed a darkening and thickening of his right great toenail.  Pain is rated 6 on a 0-10 pain scale, worse with prolonged standing, no alleviating factors identified.  He does report that he wears steel toed boots at work and is on his feet most of the time and wonders if this could be contributing to his symptoms.  He denies any history of diabetes but does have a strong family history of this.  Denies any history of vitamin B12 deficiency or neurological symptoms.  He denies any numbness or paresthesias in his hands but does report it in both feet.  He is having difficulty with daily cavities as a result of symptoms; missed work today because of symptoms.  He has not been taking any medication for symptom management.    Past Medical History:  Diagnosis Date   Abdominal pain    a. LLQ 11/2011   GERD (gastroesophageal reflux disease)    Gout    Headache(784.0)    Midsternal chest pain    a. Cardiac cath 12/10/11 with normal coronaries and LV systolic function;  11/2011 Echo EF 55-60%, no valvular abnormalities or pericardial effusion   Pericarditis    a. presumed - 11/2011   ST elevation    a. Anterior ST elevation ? early repolarization vs pericarditis    Patient Active Problem List   Diagnosis Date Noted   Macrocytic anemia 06/20/2020   Family history of colon cancer 06/20/2020   Rectal bleeding 06/20/2020   Foot abscess 08/25/2016   Gastroesophageal reflux disease 08/25/2016   Gout 08/25/2016   Midsternal chest pain    ST elevation    Abdominal pain    Chest pain 12/10/2011   Bradycardia  12/10/2011    Past Surgical History:  Procedure Laterality Date   CARDIAC CATHETERIZATION  12/10/11   normal coronaries and LV systolic function;  Echo with normal LV function, EF 55-60%, no valvular abnormalities or pericardial effusion   I & D EXTREMITY Right 08/27/2016   Procedure: IRRIGATION AND DEBRIDEMENT RIGHT FOOT;  Surgeon: Felecia Shelling, DPM;  Location: [facility];  Service: Podiatry;  Laterality: Right;   LEFT HEART CATHETERIZATION WITH CORONARY ANGIOGRAM N/A 12/10/2011   Procedure: LEFT HEART CATHETERIZATION WITH CORONARY ANGIOGRAM;  Surgeon: Laurey Morale, MD;  Location: [facility] CATH LAB;  Service: Cardiovascular;  Laterality: N/A;       Home Medications    Prior to Admission medications   Medication Sig Start Date End Date Taking? Authorizing Provider  ciclopirox (LOPROX) 0.77 % SUSP Apply 1 application  topically at bedtime. Apply nightly.  Remove with rubbing alcohol every 7 days. 10/01/23  Yes Sandrina Heaton K, PA-C  gabapentin (NEURONTIN) 300 MG capsule Take 1 capsule (300 mg total) by mouth at bedtime for 14 days. 10/01/23 10/15/23 Yes Drewey Begue, Noberto Retort, PA-C    Family History Family History  Problem Relation Age of Onset   Diabetes Maternal Grandmother    Hypertension Maternal Grandmother    Colon cancer Mother    Hypertension  Mother    Colon cancer Maternal Grandfather    Rectal cancer Neg Hx    Stomach cancer Neg Hx    Esophageal cancer Neg Hx     Social History Social History   Tobacco Use   Smoking status: Former    Current packs/day: 0.00    Average packs/day: 1.5 packs/day for 21.0 years (31.5 ttl pk-yrs)    Types: Cigarettes    Start date: 09/11/1981    Quit date: 09/11/2002    Years since quitting: 21.0   Smokeless tobacco: Never  Vaping Use   Vaping status: Never Used  Substance Use Topics   Alcohol use: No   Drug use: No    Frequency: 20.0 times per week    Types: Marijuana, Other-see comments    Comment: quit 1 month ago     Allergies    Colchicine   Review of Systems Review of Systems  Constitutional:  Positive for activity change. Negative for appetite change, fatigue and fever.  Respiratory:  Negative for cough and shortness of breath.   Cardiovascular:  Negative for chest pain.  Musculoskeletal:  Positive for arthralgias and gait problem. Negative for joint swelling and myalgias.  Neurological:  Positive for numbness (Bilateral feet). Negative for weakness.     Physical Exam Triage Vital Signs ED Triage Vitals  Encounter Vitals Group     BP 10/01/23 1706 120/85     Systolic BP Percentile --      Diastolic BP Percentile --      Pulse Rate 10/01/23 1706 (!) 58     Resp 10/01/23 1706 16     Temp 10/01/23 1706 98.7 F (37.1 C)     Temp Source 10/01/23 1706 Oral     SpO2 10/01/23 1706 98 %     Weight --      Height --      Head Circumference --      Peak Flow --      Pain Score 10/01/23 1708 6     Pain Loc --      Pain Education --      Exclude from Growth Chart --    No data found.  Updated Vital Signs BP 120/85 (BP Location: Left Arm)   Pulse (!) 58   Temp 98.7 F (37.1 C) (Oral)   Resp 16   SpO2 98%   Visual Acuity Right Eye Distance:   Left Eye Distance:   Bilateral Distance:    Right Eye Near:   Left Eye Near:    Bilateral Near:     Physical Exam Vitals reviewed.  Constitutional:      General: He is awake.     Appearance: Normal appearance. He is well-developed. He is not ill-appearing.     Comments: Very pleasant male appear stated age in no acute distress sitting comfortably in exam room  HENT:     Head: Normocephalic and atraumatic.  Cardiovascular:     Rate and Rhythm: Normal rate and regular rhythm.     Heart sounds: Normal heart sounds, S1 normal and S2 normal. No murmur heard.    Comments: Capillary refill within 2 seconds bilateral toes Pulmonary:     Effort: Pulmonary effort is normal.     Breath sounds: Normal breath sounds. No stridor. No wheezing, rhonchi or rales.      Comments: Clear to auscultation bilaterally Musculoskeletal:     Right foot: Normal range of motion. No deformity.     Left foot: Normal range of  motion. No deformity.  Feet:     Right foot:     Protective Sensation: 10 sites tested.  10 sites sensed.     Toenail Condition: Right toenails are abnormally thick. Fungal disease present.    Left foot:     Protective Sensation: 10 sites tested.  10 sites sensed.     Comments: Tenderness palpation over MTP joints without deformity.  Normal active range of motion.  Foot neurovascularly intact.  Onychomycosis noted right great toe. Neurological:     Mental Status: He is alert.  Psychiatric:        Behavior: Behavior is cooperative.      UC Treatments / Results  Labs (all labs ordered are listed, but only abnormal results are displayed) Labs Reviewed  POCT URINALYSIS DIP (MANUAL ENTRY) - Abnormal; Notable for the following components:      Result Value   Color, UA straw (*)    Protein Ur, POC trace (*)    All other components within normal limits  POCT FASTING CBG KUC MANUAL ENTRY    EKG   Radiology No results found.  Procedures Procedures (including critical care time)  Medications Ordered in UC Medications - No data to display  Initial Impression / Assessment and Plan / UC Course  I have reviewed the triage vital signs and the nursing notes.  Pertinent labs & imaging results that were available during my care of the patient were reviewed by me and considered in my medical decision making (see chart for details).     Patient is well-appearing, afebrile, nontoxic, nontachycardic.  Plain films were deferred as he had no focal bony tenderness and denied any recent trauma.  Low suspicion for gout or septic arthritis given he describes the pain as paresthesias.  We discussed potential utility of blood work including A1c, TSH, B12, CBC, CMP, however, he declined this today.  He will follow-up with primary care and so we will  try to establish him via PCP assistance for additional workup including blood work.  Random glucose in urine was normal in clinic today.  He was started on gabapentin to help with his pain and we discussed that he should not drive or drink alcohol while taking this medication as it can be sedating.  He was also noted to have onychomycosis on exam so will use topical Loprox.  Discussed that given his ongoing pain and it may be worthwhile to follow-up with podiatry and he was given the contact information for local provider with instruction to call to schedule an appointment.  Recommended that he use compression stockings as well as elevation to help manage his symptoms.  He was given a work excuse note for several days in the hopes that being off his feet would help improve his pain/discomfort.  Discussed that if he has any worsening or changing symptoms he needs to be seen immediately.  Strict return precautions given.  Excuse note provided.  Final Clinical Impressions(s) / UC Diagnoses   Final diagnoses:  Numbness and tingling of both feet  Bilateral foot pain  Onychomycosis     Discharge Instructions      We do not have any evidence of diabetes which is great news.  Take gabapentin at night.  This will make you sleepy so do not drive drink alcohol while taking it.  Follow-up with primary care for additional workup of your symptoms.  Someone should call you to schedule an appointment.  I would also like you to follow-up with podiatry;  call to schedule an appointment.  Keep your feet elevated and use compression to help with your symptoms.  Apply Loprox nightly to your right great toe.  Remove this with rubbing alcohol every 7 days.  If anything worsens or changes please return for reevaluation.       ED Prescriptions     Medication Sig Dispense Auth. Provider   gabapentin (NEURONTIN) 300 MG capsule Take 1 capsule (300 mg total) by mouth at bedtime for 14 days. 14 capsule Antoin Dargis K, PA-C    ciclopirox (LOPROX) 0.77 % SUSP Apply 1 application  topically at bedtime. Apply nightly.  Remove with rubbing alcohol every 7 days. 60 mL Desmen Schoffstall K, PA-C      PDMP not reviewed this encounter.   Jeani Hawking, PA-C 10/01/23 1851

## 2023-10-28 ENCOUNTER — Ambulatory Visit (HOSPITAL_COMMUNITY)
Admission: EM | Admit: 2023-10-28 | Discharge: 2023-10-28 | Disposition: A | Discharge status: Home / Self Care | Attending: Family Medicine | Admitting: Family Medicine

## 2023-10-28 ENCOUNTER — Encounter (HOSPITAL_COMMUNITY): Payer: Self-pay | Admitting: *Deleted

## 2023-10-28 DIAGNOSIS — R079 Chest pain, unspecified: Secondary | ICD-10-CM

## 2023-10-28 DIAGNOSIS — G8929 Other chronic pain: Secondary | ICD-10-CM

## 2023-10-28 NOTE — ED Provider Notes (Signed)
 [facility]   [id number] 10/28/23 Arrival Time: 1222  ASSESSMENT & PLAN:  1. Chronic chest pain    Acute on chronic pain. Declines any workup here. Work note provided.   OTC symptom care as needed.   Follow-up Information     [facility].   Specialty: Urgent Care Why: As needed. Contact information: [address] [place] [address] [phone]                Reviewed expectations re: course of current medical issues. Questions answered. Outlined signs and symptoms indicating need for more acute intervention. Understanding verbalized. After Visit Summary given.   SUBJECTIVE: History from: Patient. Chris Fox is a 53 y.o. male. Pt states he always had chest pain everyday but today he felt it was a little heavier it is better now than it was this morning. He had to leave work and needs a work note. He took 2 Advil this morning. He states he smokes weed for the pain. He states he has seen a lot of providers for this and no one helps they all say the same thing.  Denies: difficulty breathing. Normal PO intake without n/v/d.  OBJECTIVE:  Vitals:   10/28/23 1249  BP: 136/87  Pulse: (!) 51  Resp: 18  Temp: 98.2 F (36.8 C)  TempSrc: Oral  SpO2: 98%    General appearance: alert; no distress Eyes: PERRLA; EOMI; conjunctiva normal Lungs: speaks full sentences without difficulty; unlabored CV: reg Extremities: no edema Skin: warm and dry Neurologic: normal gait Psychological: alert and cooperative; normal mood and affect  Labs:  Labs Reviewed - No data to display  Imaging: No results found.  Allergies  Allergen Reactions   Colchicine Other (See Comments)    GI pain    Past Medical History:  Diagnosis Date   Abdominal pain    a. LLQ 11/2011   GERD (gastroesophageal reflux disease)    Gout    Headache(784.0)    Midsternal chest pain    a. Cardiac cath 12/10/11 with normal coronaries  and LV systolic function;  11/2011 Echo EF 55-60%, no valvular abnormalities or pericardial effusion   Pericarditis    a. presumed - 11/2011   ST elevation    a. Anterior ST elevation ? early repolarization vs pericarditis   Social History   Socioeconomic History   Marital status: Single    Spouse name: Not on file   Number of children: Not on file   Years of education: Not on file   Highest education level: Not on file  Occupational History   Not on file  Tobacco Use   Smoking status: Former    Current packs/day: 0.00    Average packs/day: 1.5 packs/day for 21.0 years (31.5 ttl pk-yrs)    Types: Cigarettes    Start date: 09/11/1981    Quit date: 09/11/2002    Years since quitting: 21.1   Smokeless tobacco: Never  Vaping Use   Vaping status: Never Used  Substance and Sexual Activity   Alcohol use: No   Drug use: No    Frequency: 20.0 times per week    Types: Marijuana, Other-see comments   Sexual activity: Never  Other Topics Concern   Not on file  Social History Narrative   Not on file   Social Drivers of Health   Financial Resource Strain: Not on file  Food Insecurity: Not on file  Transportation Needs: Not on file  Physical Activity:  Not on file  Stress: Not on file  Social Connections: Not on file  Intimate Partner Violence: Not on file   Family History  Problem Relation Age of Onset   Diabetes Maternal Grandmother    Hypertension Maternal Grandmother    Colon cancer Mother    Hypertension Mother    Colon cancer Maternal Grandfather    Rectal cancer Neg Hx    Stomach cancer Neg Hx    Esophageal cancer Neg Hx    Past Surgical History:  Procedure Laterality Date   CARDIAC CATHETERIZATION  12/10/11   normal coronaries and LV systolic function;  Echo with normal LV function, EF 55-60%, no valvular abnormalities or pericardial effusion   I & D EXTREMITY Right 08/27/2016   Procedure: IRRIGATION AND DEBRIDEMENT RIGHT FOOT;  Surgeon: Felecia Shelling, DPM;   Location: [facility];  Service: Podiatry;  Laterality: Right;   LEFT HEART CATHETERIZATION WITH CORONARY ANGIOGRAM N/A 12/10/2011   Procedure: LEFT HEART CATHETERIZATION WITH CORONARY ANGIOGRAM;  Surgeon: Laurey Morale, MD;  Location: [facility] CATH LAB;  Service: Cardiovascular;  Laterality: N/A;     [name], MD 10/28/23 1306

## 2023-10-28 NOTE — ED Triage Notes (Signed)
 Pt states he always had chest pain everyday but today he felt it was a little heavier it is better now than it was this morning. He had to leave work and needs a work note. He took 2 Advil this morning. He states he smokes weed for the pain. He states he has seen a lot of providers for this and no one helps they all say the same thing.

## 2023-12-30 ENCOUNTER — Ambulatory Visit (HOSPITAL_COMMUNITY)
Admission: EM | Admit: 2023-12-30 | Discharge: 2023-12-30 | Disposition: A | Discharge status: Home / Self Care | Attending: Nurse Practitioner | Admitting: Nurse Practitioner

## 2023-12-30 ENCOUNTER — Encounter (HOSPITAL_COMMUNITY): Payer: Self-pay

## 2023-12-30 DIAGNOSIS — M25562 Pain in left knee: Secondary | ICD-10-CM

## 2023-12-30 DIAGNOSIS — M1712 Unilateral primary osteoarthritis, left knee: Secondary | ICD-10-CM | POA: Diagnosis not present

## 2023-12-30 MED ORDER — METHYLPREDNISOLONE 4 MG PO TBPK: ORAL_TABLET | ORAL | 0 refills | Status: AC

## 2023-12-30 MED ORDER — METHYLPREDNISOLONE SODIUM SUCC 125 MG IJ SOLR
80.0000 mg | Freq: Once | INTRAMUSCULAR | Status: AC
  Administered 2023-12-30: 80 mg via INTRAMUSCULAR

## 2023-12-30 MED ORDER — METHYLPREDNISOLONE SODIUM SUCC 125 MG IJ SOLR
INTRAMUSCULAR | Status: AC
  Filled 2023-12-30: qty 2

## 2023-12-30 NOTE — ED Provider Notes (Signed)
 [facility]    CSN: [id number] Arrival date & time: 12/30/23  4098      History   Chief Complaint Chief Complaint  Patient presents with   Knee Pain    HPI Chris Fox is a 53 y.o. male.   HPI  He is in today for evaluation of left knee pain.  He endorses that he has a history of swelling and pain to his left knee.  He was reports that he needs knee replacements.  However he denies being under the care of orthopedics.  He is unsure when his last x-ray was done.  He endorses that he has been to urgent care in the past and had injections.  He endorses that he works full-time standing on his and the pain is getting worse.  He endorses that he is having more left shin pain today than knee pain.  He denies any recent accident or injuries.  He endorses that the pain has been going on for 6 months but is just getting worse. Past Medical History:  Diagnosis Date   Abdominal pain    a. LLQ 11/2011   GERD (gastroesophageal reflux disease)    Gout    Headache(784.0)    Midsternal chest pain    a. Cardiac cath 12/10/11 with normal coronaries and LV systolic function;  11/2011 Echo EF 55-60%, no valvular abnormalities or pericardial effusion   Pericarditis    a. presumed - 11/2011   ST elevation    a. Anterior ST elevation ? early repolarization vs pericarditis    Patient Active Problem List   Diagnosis Date Noted   Macrocytic anemia 06/20/2020   Family history of colon cancer 06/20/2020   Rectal bleeding 06/20/2020   Foot abscess 08/25/2016   Gastroesophageal reflux disease 08/25/2016   Gout 08/25/2016   Midsternal chest pain    ST elevation    Abdominal pain    Chest pain 12/10/2011   Bradycardia 12/10/2011    Past Surgical History:  Procedure Laterality Date   CARDIAC CATHETERIZATION  12/10/11   normal coronaries and LV systolic function;  Echo with normal LV function, EF 55-60%, no valvular abnormalities or pericardial effusion   I & D EXTREMITY Right  08/27/2016   Procedure: IRRIGATION AND DEBRIDEMENT RIGHT FOOT;  Surgeon: Dot Gazella, DPM;  Location: [facility];  Service: Podiatry;  Laterality: Right;   LEFT HEART CATHETERIZATION WITH CORONARY ANGIOGRAM N/A 12/10/2011   Procedure: LEFT HEART CATHETERIZATION WITH CORONARY ANGIOGRAM;  Surgeon: Darlis Eisenmenger, MD;  Location: [facility] CATH LAB;  Service: Cardiovascular;  Laterality: N/A;       Home Medications    Prior to Admission medications   Medication Sig Start Date End Date Taking? Authorizing Provider  methylPREDNISolone  (MEDROL ) 4 MG TBPK tablet Follow package instructions. 12/30/23 01/05/24 Yes Willmar Stockinger, Marcelene Sep, NP  ciclopirox  (LOPROX ) 0.77 % SUSP Apply 1 application  topically at bedtime. Apply nightly.  Remove with rubbing alcohol every 7 days. 10/01/23   Raspet, Erin K, PA-C  gabapentin  (NEURONTIN ) 300 MG capsule Take 1 capsule (300 mg total) by mouth at bedtime for 14 days. 10/01/23 10/15/23  Raspet, Betsey Brow, PA-C    Family History Family History  Problem Relation Age of Onset   Diabetes Maternal Grandmother    Hypertension Maternal Grandmother    Colon cancer Mother    Hypertension Mother    Colon cancer Maternal Grandfather    Rectal cancer Neg Hx    Stomach cancer Neg Hx  Esophageal cancer Neg Hx     Social History Social History   Tobacco Use   Smoking status: Former    Current packs/day: 0.00    Average packs/day: 1.5 packs/day for 21.0 years (31.5 ttl pk-yrs)    Types: Cigarettes    Start date: 09/11/1981    Quit date: 09/11/2002    Years since quitting: 21.3   Smokeless tobacco: Never  Vaping Use   Vaping status: Never Used  Substance Use Topics   Alcohol use: No   Drug use: No    Frequency: 20.0 times per week    Types: Marijuana, Other-see comments     Allergies   Colchicine    Review of Systems Review of Systems   Physical Exam Triage Vital Signs ED Triage Vitals  Encounter Vitals Group     BP 12/30/23 1031 120/69     Systolic BP Percentile --       Diastolic BP Percentile --      Pulse Rate 12/30/23 1031 (!) 55     Resp 12/30/23 1031 16     Temp 12/30/23 1031 98.2 F (36.8 C)     Temp Source 12/30/23 1031 Oral     SpO2 12/30/23 1031 96 %     Weight --      Height --      Head Circumference --      Peak Flow --      Pain Score 12/30/23 1032 8     Pain Loc --      Pain Education --      Exclude from Growth Chart --    No data found.  Updated Vital Signs BP 120/69 (BP Location: Right Arm)   Pulse (!) 55   Temp 98.2 F (36.8 C) (Oral)   Resp 16   SpO2 96%   Visual Acuity Right Eye Distance:   Left Eye Distance:   Bilateral Distance:    Right Eye Near:   Left Eye Near:    Bilateral Near:     Physical Exam Constitutional:      General: He is not in acute distress.    Appearance: He is normal weight.  Cardiovascular:     Rate and Rhythm: Bradycardia present.  Pulmonary:     Effort: Pulmonary effort is normal.  Musculoskeletal:     Left knee: Swelling present. No erythema. Decreased range of motion.     Comments: Right 5/5 Left 3/5  Skin:    General: Skin is warm and dry.     Capillary Refill: Capillary refill takes less than 2 seconds.  Neurological:     General: No focal deficit present.     Mental Status: He is alert and oriented to person, place, and time.      UC Treatments / Results  Labs (all labs ordered are listed, but only abnormal results are displayed) Labs Reviewed - No data to display  EKG   Radiology No results found.  Procedures Procedures (including critical care time)  Medications Ordered in UC Medications  methylPREDNISolone  sodium succinate (SOLU-MEDROL ) 125 mg/2 mL injection 80 mg (80 mg Intramuscular Given 12/30/23 1131)    Initial Impression / Assessment and Plan / UC Course  I have reviewed the triage vital signs and the nursing notes.  Pertinent labs & imaging results that were available during my care of the patient were reviewed by me and considered in my medical  decision making (see chart for details).     Left knee pain Final Clinical  Impressions(s) / UC Diagnoses   Final diagnoses:  Acute pain of left knee  Tricompartment osteoarthritis of left knee     Discharge Instructions      You have a previous diagnosis of tricompartmental osteoarthritis and for your left knee and shin pain you have been given methylprednisolone  80 mg IM injection times.  You have also been prescribed Medrol  dose pack for ongoing swelling and pain.  You are to take as directed.  You have been provided information for [facility] urgent care clinic along with regular clinic to get established for further evaluation and treatment for your tricompartmental osteoarthritis. You are encouraged to rest your knee apply ice and to brace along with elevate.  You have been provided with a work note.   ED Prescriptions     Medication Sig Dispense Auth. Provider   methylPREDNISolone  (MEDROL ) 4 MG TBPK tablet Follow package instructions. 21 tablet Gregoria Leas, NP      PDMP not reviewed this encounter.   Eleanore [place], NP 12/30/23 1134

## 2023-12-30 NOTE — Discharge Instructions (Addendum)
 You have a previous diagnosis of tricompartmental osteoarthritis and for your left knee and shin pain you have been given methylprednisolone  80 mg IM injection times.  You have also been prescribed Medrol  dose pack for ongoing swelling and pain.  You are to take as directed.  You have been provided information for [facility] urgent care clinic along with regular clinic to get established for further evaluation and treatment for your tricompartmental osteoarthritis. You are encouraged to rest your knee apply ice and to brace along with elevate.  You have been provided with a work note.

## 2023-12-30 NOTE — ED Triage Notes (Signed)
 Patient here today with c/o left knee pain X 6 months. No known injury. Patient states that he has had a knee aspiration and injection in the past with some relief.

## 2024-02-02 ENCOUNTER — Encounter (HOSPITAL_COMMUNITY): Payer: Self-pay | Admitting: Emergency Medicine

## 2024-02-02 ENCOUNTER — Ambulatory Visit (HOSPITAL_COMMUNITY)
Admission: EM | Admit: 2024-02-02 | Discharge: 2024-02-02 | Disposition: A | Discharge status: Home / Self Care | Attending: Family Medicine | Admitting: Family Medicine

## 2024-02-02 DIAGNOSIS — R21 Rash and other nonspecific skin eruption: Secondary | ICD-10-CM | POA: Diagnosis not present

## 2024-02-02 DIAGNOSIS — W57XXXA Bitten or stung by nonvenomous insect and other nonvenomous arthropods, initial encounter: Secondary | ICD-10-CM | POA: Diagnosis not present

## 2024-02-02 DIAGNOSIS — S30861A Insect bite (nonvenomous) of abdominal wall, initial encounter: Secondary | ICD-10-CM

## 2024-02-02 MED ORDER — DOXYCYCLINE HYCLATE 100 MG PO CAPS: 100.0000 mg | ORAL_CAPSULE | Freq: Two times a day (BID) | ORAL | 0 refills | Status: AC

## 2024-02-02 MED ORDER — PREDNISONE 20 MG PO TABS: 40.0000 mg | ORAL_TABLET | Freq: Every day | ORAL | 0 refills | Status: AC

## 2024-02-02 NOTE — ED Provider Notes (Signed)
 [facility]    CSN: [id number] Arrival date & time: 02/02/24  9193      History   Chief Complaint Chief Complaint  Patient presents with   Rash    HPI Chris Fox is a 53 y.o. male.   Patient presents to urgent care for evaluation of tick bite to the right lower quadrant abdomen.  He pulled a tick off of his right lower quadrant abdomen 2 days ago now notices a firm red bump to the place of tick bite.  Additionally reports itchy rash started to the left arm after touching his tick bite of the abdomen.  The itchy rash has now spread to the right arm and the left upper chest wall.  He denies drainage from the itchy rash/tick bite area.  He has not had a recent fever, chills, nausea, vomiting, body aches, or recent antibiotic or steroid use in the last 90 days.  Denies history of diabetes/immunosuppression.  Denies recent exposures to sick contacts with similar rash.  He brought his dog to the dog park where he may have been exposed to poison ivy but he is unsure.  He has not attempted use of any over-the-counter medications to help with symptoms PTA.     Past Medical History:  Diagnosis Date   Abdominal pain    a. LLQ 11/2011   GERD (gastroesophageal reflux disease)    Gout    Headache(784.0)    Midsternal chest pain    a. Cardiac cath 12/10/11 with normal coronaries and LV systolic function;  11/2011 Echo EF 55-60%, no valvular abnormalities or pericardial effusion   Pericarditis    a. presumed - 11/2011   ST elevation    a. Anterior ST elevation ? early repolarization vs pericarditis    Patient Active Problem List   Diagnosis Date Noted   Macrocytic anemia 06/20/2020   Family history of colon cancer 06/20/2020   Rectal bleeding 06/20/2020   Foot abscess 08/25/2016   Gastroesophageal reflux disease 08/25/2016   Gout 08/25/2016   Midsternal chest pain    ST elevation    Abdominal pain    Chest pain 12/10/2011   Bradycardia 12/10/2011    Past Surgical  History:  Procedure Laterality Date   CARDIAC CATHETERIZATION  12/10/11   normal coronaries and LV systolic function;  Echo with normal LV function, EF 55-60%, no valvular abnormalities or pericardial effusion   I & D EXTREMITY Right 08/27/2016   Procedure: IRRIGATION AND DEBRIDEMENT RIGHT FOOT;  Surgeon: Thresa CHRISTELLA Sar, DPM;  Location: [facility];  Service: Podiatry;  Laterality: Right;   LEFT HEART CATHETERIZATION WITH CORONARY ANGIOGRAM N/A 12/10/2011   Procedure: LEFT HEART CATHETERIZATION WITH CORONARY ANGIOGRAM;  Surgeon: Ezra GORMAN Shuck, MD;  Location: [facility] CATH LAB;  Service: Cardiovascular;  Laterality: N/A;       Home Medications    Prior to Admission medications   Medication Sig Start Date End Date Taking? Authorizing Provider  doxycycline  (VIBRAMYCIN ) 100 MG capsule Take 1 capsule (100 mg total) by mouth 2 (two) times daily. 02/02/24  Yes Enedelia Dorna CHRISTELLA, FNP  predniSONE  (DELTASONE ) 20 MG tablet Take 2 tablets (40 mg total) by mouth daily with breakfast for 5 days. 02/02/24 02/07/24 Yes StanhopeDorna CHRISTELLA, FNP  ciclopirox  (LOPROX ) 0.77 % SUSP Apply 1 application  topically at bedtime. Apply nightly.  Remove with rubbing alcohol every 7 days. 10/01/23   Raspet, Rocky POUR, PA-C  gabapentin  (NEURONTIN ) 300 MG capsule Take 1 capsule (300  mg total) by mouth at bedtime for 14 days. 10/01/23 10/15/23  Raspet, Rocky POUR, PA-C    Family History Family History  Problem Relation Age of Onset   Diabetes Maternal Grandmother    Hypertension Maternal Grandmother    Colon cancer Mother    Hypertension Mother    Colon cancer Maternal Grandfather    Rectal cancer Neg Hx    Stomach cancer Neg Hx    Esophageal cancer Neg Hx     Social History Social History   Tobacco Use   Smoking status: Former    Current packs/day: 0.00    Average packs/day: 1.5 packs/day for 21.0 years (31.5 ttl pk-yrs)    Types: Cigarettes    Start date: 09/11/1981    Quit date: 09/11/2002    Years since quitting: 21.4    Smokeless tobacco: Never  Vaping Use   Vaping status: Never Used  Substance Use Topics   Alcohol use: No   Drug use: No    Frequency: 20.0 times per week    Types: Marijuana, Other-see comments     Allergies   Colchicine    Review of Systems Review of Systems Per HPI  Physical Exam Triage Vital Signs ED Triage Vitals  Encounter Vitals Group     BP 02/02/24 0910 124/70     Girls Systolic BP Percentile --      Girls Diastolic BP Percentile --      Boys Systolic BP Percentile --      Boys Diastolic BP Percentile --      Pulse Rate 02/02/24 0910 67     Resp 02/02/24 0910 16     Temp 02/02/24 0910 98.2 F (36.8 C)     Temp Source 02/02/24 0910 Oral     SpO2 02/02/24 0910 96 %     Weight --      Height --      Head Circumference --      Peak Flow --      Pain Score 02/02/24 0909 0     Pain Loc --      Pain Education --      Exclude from Growth Chart --    No data found.  Updated Vital Signs BP 124/70 (BP Location: Left Arm)   Pulse 67   Temp 98.2 F (36.8 C) (Oral)   Resp 16   SpO2 96%   Visual Acuity Right Eye Distance:   Left Eye Distance:   Bilateral Distance:    Right Eye Near:   Left Eye Near:    Bilateral Near:     Physical Exam Vitals and nursing note reviewed.  Constitutional:      Appearance: He is not ill-appearing or toxic-appearing.  HENT:     Head: Normocephalic and atraumatic.     Right Ear: Hearing and external ear normal.     Left Ear: Hearing and external ear normal.     Nose: Nose normal.     Mouth/Throat:     Lips: Pink.  Eyes:     General: Lids are normal. Vision grossly intact. Gaze aligned appropriately.     Extraocular Movements: Extraocular movements intact.     Conjunctiva/sclera: Conjunctivae normal.  Pulmonary:     Effort: Pulmonary effort is normal.  Musculoskeletal:     Cervical back: Neck supple.  Skin:    General: Skin is warm and dry.     Capillary Refill: Capillary refill takes less than 2 seconds.      Findings: Lesion (Erythematous  pustular lesion to the right lower quadrant abdomen as seen in image below.  Mildly tender to palpation to surrounding soft tissues.  Mildly indurated.  Warm to touch.  No active bleeding or drainage.) and rash (Pruritic maculopapular erythematous rash to the right upper arm, left upper arm, left upper chest, and a few lesions to the right inguinal area.) present.  Neurological:     General: No focal deficit present.     Mental Status: He is alert and oriented to person, place, and time. Mental status is at baseline.     Cranial Nerves: No dysarthria or facial asymmetry.  Psychiatric:        Mood and Affect: Mood normal.        Speech: Speech normal.        Behavior: Behavior normal.        Thought Content: Thought content normal.        Judgment: Judgment normal.    Tick bite   Right lower quadrant pruritic rash lesions   Left upper chest pruritic rash   Left medial upper arm pruritic rash   Right forearm pruritic rash    UC Treatments / Results  Labs (all labs ordered are listed, but only abnormal results are displayed) Labs Reviewed - No data to display  EKG   Radiology No results found.  Procedures Procedures (including critical care time)  Medications Ordered in UC Medications - No data to display  Initial Impression / Assessment and Plan / UC Course  I have reviewed the triage vital signs and the nursing notes.  Pertinent labs & imaging results that were available during my care of the patient were reviewed by me and considered in my medical decision making (see chart for details).   1.  Tick bite of abdomen Presentation is suspicious for tick borne illness.  We'll treat empirically with doxycycline  BID for 10 days.  Topical hydrocortisone cream PRN for itch.  Shared decision making used to defer testing for RMSF, lyme disease, and Ehrlichia as this will not change our treatment plan. Infection return precautions discussed,  erythema outlined with skin pen to track change.    2.  Rash and nonspecific skin eruption It is unclear what is causing patient's pruritic rash to the rest of his body, however I suspect that this is allergic contact dermatitis caused by poison ivy. I would like to treat with prednisone  burst once daily for the next 5 days. He may purchase topical hydrocortisone cream over-the-counter and use this as needed for itching.  No current signs of secondary bacterial infection, however infection return precautions discussed.  Advised to avoid itching rash and follow-up with PCP as needed for new-worsening symptoms.   Counseled patient on potential for adverse effects with medications prescribed/recommended today, strict ER and return-to-clinic precautions discussed, patient verbalized understanding.    Final Clinical Impressions(s) / UC Diagnoses   Final diagnoses:  Rash and nonspecific skin eruption  Tick bite of abdomen, initial encounter     Discharge Instructions      You have cellulitis due to recent tick bite which is a superficial skin infection.  Take antibiotic every 12 hours with a snack/food for the next 7 days. Watch for worsening signs of infection to the lesion such as spreading redness, swelling, pus drainage, pain, fever/chills.  Your rash is consistent with poison ivy dermatitis. Take prednisone  once daily for the next 5 days.  Take with food to avoid stomach upset. Do not take any ibuprofen /aspirin /Goody powders while  taking prednisone  as this can cause stomach upset.  Avoid itching the rash is much as possible.  You may also apply over-the-counter hydrocortisone cream to the rash to help with itching.   If you develop any new or worsening symptoms or if your symptoms do not start to improve, please return here or follow-up with your primary care provider. If your symptoms are severe, please go to the emergency room.    ED Prescriptions     Medication Sig Dispense  Auth. Provider   predniSONE  (DELTASONE ) 20 MG tablet Take 2 tablets (40 mg total) by mouth daily with breakfast for 5 days. 10 tablet Enedelia Dorna HERO, FNP   doxycycline  (VIBRAMYCIN ) 100 MG capsule Take 1 capsule (100 mg total) by mouth 2 (two) times daily. 20 capsule Enedelia Dorna HERO, FNP      PDMP not reviewed this encounter.   Enedelia Dorna Fox, [place] 02/02/24 1004

## 2024-02-02 NOTE — Discharge Instructions (Addendum)
 You have cellulitis due to recent tick bite which is a superficial skin infection.  Take antibiotic every 12 hours with a snack/food for the next 7 days. Watch for worsening signs of infection to the lesion such as spreading redness, swelling, pus drainage, pain, fever/chills.  Your rash is consistent with poison ivy dermatitis. Take prednisone  once daily for the next 5 days.  Take with food to avoid stomach upset. Do not take any ibuprofen /aspirin /Goody powders while taking prednisone  as this can cause stomach upset.  Avoid itching the rash is much as possible.  You may also apply over-the-counter hydrocortisone cream to the rash to help with itching.   If you develop any new or worsening symptoms or if your symptoms do not start to improve, please return here or follow-up with your primary care provider. If your symptoms are severe, please go to the emergency room.

## 2024-02-02 NOTE — ED Triage Notes (Signed)
 Pt reports had tick on himself two days ago. Broke out in a rash that is itching. Tried alcohol and peroxide but hasnt taken anything for itch.

## 2024-07-28 ENCOUNTER — Ambulatory Visit (HOSPITAL_COMMUNITY): Admission: EM | Admit: 2024-07-28 | Discharge: 2024-07-28 | Disposition: A | Discharge status: Home / Self Care

## 2024-07-28 ENCOUNTER — Encounter (HOSPITAL_COMMUNITY): Payer: Self-pay | Admitting: *Deleted

## 2024-07-28 DIAGNOSIS — L0501 Pilonidal cyst with abscess: Secondary | ICD-10-CM

## 2024-07-28 MED ORDER — SULFAMETHOXAZOLE-TRIMETHOPRIM 800-160 MG PO TABS: 1.0000 | ORAL_TABLET | Freq: Two times a day (BID) | ORAL | 0 refills | Status: AC

## 2024-07-28 NOTE — ED Triage Notes (Signed)
 Pt reports he has a boil on his perineum x 2 months. This week it has gotten bigger. States it started draining this morning, and pain is improved. No meds used for pain. He has  used a heated washcloth on the area.

## 2024-07-28 NOTE — ED Provider Notes (Signed)
 " [facility]    CSN: [id number] Arrival date & time: 07/28/24  1118      History   Chief Complaint Chief Complaint  Patient presents with   Abscess    HPI Chris Fox is a 53 y.o. male.   Chris Fox presents today with complaint of abscess to his perineum.  He reports that has been present for 2 months, and has been progressively growing since onset.  The abscess has made it painful to sit and lay down in a supine position.  Today he reports that the abscess popped.  He is unsure what color discharge she has had from the area.  He has not attempted any at home measures for his symptoms.  He denies fevers, chills, nausea, vomiting is related to the abscess  The history is provided by the patient.  Abscess Associated symptoms: no nausea and no vomiting     Past Medical History:  Diagnosis Date   Abdominal pain    a. LLQ 11/2011   GERD (gastroesophageal reflux disease)    Gout    Headache(784.0)    Midsternal chest pain    a. Cardiac cath 12/10/11 with normal coronaries and LV systolic function;  11/2011 Echo EF 55-60%, no valvular abnormalities or pericardial effusion   Pericarditis    a. presumed - 11/2011   ST elevation    a. Anterior ST elevation ? early repolarization vs pericarditis    Patient Active Problem List   Diagnosis Date Noted   Macrocytic anemia 06/20/2020   Family history of colon cancer 06/20/2020   Rectal bleeding 06/20/2020   Foot abscess 08/25/2016   Gastroesophageal reflux disease 08/25/2016   Gout 08/25/2016   Midsternal chest pain    ST elevation    Abdominal pain    Chest pain 12/10/2011   Bradycardia 12/10/2011    Past Surgical History:  Procedure Laterality Date   CARDIAC CATHETERIZATION  12/10/11   normal coronaries and LV systolic function;  Echo with normal LV function, EF 55-60%, no valvular abnormalities or pericardial effusion   I & D EXTREMITY Right 08/27/2016   Procedure: IRRIGATION AND DEBRIDEMENT RIGHT FOOT;   Surgeon: Thresa CHRISTELLA Sar, DPM;  Location: [facility];  Service: Podiatry;  Laterality: Right;   LEFT HEART CATHETERIZATION WITH CORONARY ANGIOGRAM N/A 12/10/2011   Procedure: LEFT HEART CATHETERIZATION WITH CORONARY ANGIOGRAM;  Surgeon: Ezra GORMAN Shuck, MD;  Location: [facility] CATH LAB;  Service: Cardiovascular;  Laterality: N/A;       Home Medications    Prior to Admission medications  Medication Sig Start Date End Date Taking? Authorizing Provider  ciclopirox  (LOPROX ) 0.77 % SUSP Apply 1 application  topically at bedtime. Apply nightly.  Remove with rubbing alcohol every 7 days. Patient not taking: Reported on 07/28/2024 10/01/23   Raspet, Erin K, PA-C  doxycycline  (VIBRAMYCIN ) 100 MG capsule Take 1 capsule (100 mg total) by mouth 2 (two) times daily. Patient not taking: Reported on 07/28/2024 02/02/24   Enedelia Dorna CHRISTELLA, FNP  gabapentin  (NEURONTIN ) 300 MG capsule Take 1 capsule (300 mg total) by mouth at bedtime for 14 days. Patient not taking: Reported on 07/28/2024 10/01/23 10/15/23  Raspet, Rocky POUR, PA-C    Family History Family History  Problem Relation Age of Onset   Diabetes Maternal Grandmother    Hypertension Maternal Grandmother    Colon cancer Mother    Hypertension Mother    Colon cancer Maternal Grandfather    Rectal cancer Neg Hx    Stomach  cancer Neg Hx    Esophageal cancer Neg Hx     Social History Social History[1]   Allergies   Colchicine    Review of Systems Review of Systems  Constitutional: Negative.   Gastrointestinal:  Negative for constipation, diarrhea, nausea, rectal pain and vomiting.  Skin: Negative.      Physical Exam Triage Vital Signs ED Triage Vitals  Encounter Vitals Group     BP 07/28/24 1225 118/81     Girls Systolic BP Percentile --      Girls Diastolic BP Percentile --      Boys Systolic BP Percentile --      Boys Diastolic BP Percentile --      Pulse Rate 07/28/24 1225 73     Resp 07/28/24 1225 18     Temp 07/28/24 1225 98.1 F (36.7  C)     Temp Source 07/28/24 1225 Oral     SpO2 07/28/24 1225 96 %     Weight --      Height --      Head Circumference --      Peak Flow --      Pain Score 07/28/24 1222 4     Pain Loc --      Pain Education --      Exclude from Growth Chart --    No data found.  Updated Vital Signs BP 118/81 (BP Location: Right Arm)   Pulse 73   Temp 98.1 F (36.7 C) (Oral)   Resp 18   SpO2 96%   Visual Acuity Right Eye Distance:   Left Eye Distance:   Bilateral Distance:    Right Eye Near:   Left Eye Near:    Bilateral Near:     Physical Exam Vitals and nursing note reviewed. Exam conducted with a chaperone present Chris Fox, CMA).  Constitutional:      General: He is not in acute distress.    Appearance: Normal appearance. He is normal weight. He is not toxic-appearing.  Eyes:     Conjunctiva/sclera: Conjunctivae normal.  Cardiovascular:     Rate and Rhythm: Normal rate and regular rhythm.     Heart sounds: Normal heart sounds.  Pulmonary:     Effort: Pulmonary effort is normal.     Breath sounds: Normal breath sounds and air entry.  Musculoskeletal:     Lumbar back: Decreased range of motion: Guarded due to pain. Negative right straight leg raise test and negative left straight leg raise test.  Skin:    General: Skin is warm and dry.     Findings: Abscess present. No rash.     Comments: Pilonidal abscess noted to perineum between scrotum and rectum.  Area opened and freely draining purulent fluid.  No further fluctuation noted.  Area is tender to palpation without any surrounding erythema or induration  Neurological:     Mental Status: He is alert and oriented to person, place, and time.  Psychiatric:        Mood and Affect: Mood normal.        Behavior: Behavior normal.      UC Treatments / Results  Labs (all labs ordered are listed, but only abnormal results are displayed) Labs Reviewed - No data to display  EKG   Radiology No results  found.  Procedures Procedures (including critical care time)  Medications Ordered in UC Medications - No data to display  Initial Impression / Assessment and Plan / UC Course  I have reviewed the triage vital  signs and the nursing notes.  Pertinent labs & imaging results that were available during my care of the patient were reviewed by me and considered in my medical decision making (see chart for details).     Pilonidal cyst with abscess Incision and drainage not needed at this time as area is already open and draining well.  Will initiate Bactrim DS twice daily for 7 days.  Advised sitz bath's.  Patient will return with any worsening of symptoms Final Clinical Impressions(s) / UC Diagnoses   Final diagnoses:  None   Discharge Instructions   None    ED Prescriptions   None    PDMP not reviewed this encounter.     [1]  Social History Tobacco Use   Smoking status: Former    Current packs/day: 0.00    Average packs/day: 1.5 packs/day for 21.0 years (31.5 ttl pk-yrs)    Types: Cigarettes    Start date: 09/11/1981    Quit date: 09/11/2002    Years since quitting: 21.8   Smokeless tobacco: Never  Vaping Use   Vaping status: Never Used  Substance Use Topics   Alcohol use: No   Drug use: Yes    Frequency: 20.0 times per week    Types: Marijuana     Leatrice Vernell HERO, NP 07/28/24 1707  "

## 2024-07-28 NOTE — Discharge Instructions (Addendum)
 Pilonidal Cyst - What this is A pilonidal cyst is an infected pocket of skin near the tailbone that can become painful and drain fluid. Your cyst is already draining on its own, so incision and drainage is not needed at this time. Treatment plan Trimethoprim / sulfamethoxazole (Bactrim) - twice daily for 7 days This antibiotic treats the underlying infection. Take with a full glass of water. Finish the entire course, even if symptoms improve. Possible side effects include nausea, rash, or sensitivity to sunlight. Warm baths / sitz baths Soak the area in warm water 2-3 times daily. This helps promote continued drainage and healing. Wound care Keep the area clean and dry between soaks. A light dressing may be used to absorb drainage. Avoid pressure or prolonged sitting when possible. Call your clinic if Pain, redness, or swelling worsens, fever develops, drainage stops with increasing pain, or you notice spreading redness. Go to the Emergency Department if High fever, rapidly worsening pain, significant swelling, or signs of spreading infection occur.
